# Patient Record
Sex: Male | Born: 1935 | Race: White | Hispanic: No | Marital: Married | Smoking: Former smoker
Health system: Southern US, Community
[De-identification: ages and names within clinical notes are randomized; demographics above are authoritative.]

## PROBLEM LIST (undated history)

## (undated) DIAGNOSIS — E7800 Pure hypercholesterolemia, unspecified: Secondary | ICD-10-CM

## (undated) DIAGNOSIS — C259 Malignant neoplasm of pancreas, unspecified: Secondary | ICD-10-CM

## (undated) DIAGNOSIS — I10 Essential (primary) hypertension: Secondary | ICD-10-CM

## (undated) HISTORY — PX: CARDIAC CATHETERIZATION: SHX172

---

## 2001-02-24 ENCOUNTER — Ambulatory Visit (HOSPITAL_COMMUNITY): Admission: RE | Admit: 2001-02-24 | Discharge: 2001-02-24 | Payer: Self-pay | Admitting: Internal Medicine

## 2002-09-27 ENCOUNTER — Other Ambulatory Visit: Admission: RE | Admit: 2002-09-27 | Discharge: 2002-09-27 | Payer: Self-pay | Admitting: Dermatology

## 2003-10-17 ENCOUNTER — Other Ambulatory Visit: Admission: RE | Admit: 2003-10-17 | Discharge: 2003-10-17 | Payer: Self-pay | Admitting: Dermatology

## 2005-07-12 ENCOUNTER — Other Ambulatory Visit: Admission: RE | Admit: 2005-07-12 | Discharge: 2005-07-12 | Payer: Self-pay | Admitting: Dermatology

## 2006-04-11 ENCOUNTER — Ambulatory Visit (HOSPITAL_COMMUNITY): Admission: RE | Admit: 2006-04-11 | Discharge: 2006-04-11 | Payer: Self-pay | Admitting: Internal Medicine

## 2006-04-11 ENCOUNTER — Ambulatory Visit: Payer: Self-pay | Admitting: Internal Medicine

## 2006-04-11 ENCOUNTER — Encounter (INDEPENDENT_AMBULATORY_CARE_PROVIDER_SITE_OTHER): Payer: Self-pay | Admitting: Specialist

## 2006-10-21 ENCOUNTER — Ambulatory Visit: Payer: Self-pay | Admitting: Internal Medicine

## 2007-01-09 ENCOUNTER — Inpatient Hospital Stay (HOSPITAL_COMMUNITY): Admission: AD | Admit: 2007-01-09 | Discharge: 2007-01-11 | Payer: Self-pay | Admitting: Cardiology

## 2007-01-09 ENCOUNTER — Ambulatory Visit: Payer: Self-pay | Admitting: Internal Medicine

## 2007-01-27 ENCOUNTER — Ambulatory Visit (HOSPITAL_COMMUNITY): Admission: RE | Admit: 2007-01-27 | Discharge: 2007-01-27 | Payer: Self-pay | Admitting: Internal Medicine

## 2007-02-08 ENCOUNTER — Ambulatory Visit: Payer: Self-pay | Admitting: Cardiology

## 2007-04-25 ENCOUNTER — Ambulatory Visit: Payer: Self-pay | Admitting: Cardiology

## 2007-07-24 ENCOUNTER — Ambulatory Visit: Payer: Self-pay

## 2007-07-24 ENCOUNTER — Ambulatory Visit: Payer: Self-pay | Admitting: Cardiology

## 2007-07-28 ENCOUNTER — Ambulatory Visit (HOSPITAL_COMMUNITY): Admission: RE | Admit: 2007-07-28 | Discharge: 2007-07-28 | Payer: Self-pay | Admitting: Internal Medicine

## 2007-11-08 ENCOUNTER — Ambulatory Visit: Payer: Self-pay | Admitting: Cardiology

## 2008-05-22 ENCOUNTER — Ambulatory Visit: Payer: Self-pay | Admitting: Cardiology

## 2009-05-29 DIAGNOSIS — I10 Essential (primary) hypertension: Secondary | ICD-10-CM | POA: Insufficient documentation

## 2009-05-29 DIAGNOSIS — E119 Type 2 diabetes mellitus without complications: Secondary | ICD-10-CM | POA: Insufficient documentation

## 2009-05-29 DIAGNOSIS — E785 Hyperlipidemia, unspecified: Secondary | ICD-10-CM | POA: Insufficient documentation

## 2009-05-29 DIAGNOSIS — I2510 Atherosclerotic heart disease of native coronary artery without angina pectoris: Secondary | ICD-10-CM | POA: Insufficient documentation

## 2009-06-12 ENCOUNTER — Ambulatory Visit: Payer: Self-pay | Admitting: Cardiology

## 2009-07-09 ENCOUNTER — Telehealth (INDEPENDENT_AMBULATORY_CARE_PROVIDER_SITE_OTHER): Payer: Self-pay | Admitting: *Deleted

## 2009-07-09 ENCOUNTER — Encounter (INDEPENDENT_AMBULATORY_CARE_PROVIDER_SITE_OTHER): Payer: Self-pay | Admitting: *Deleted

## 2009-07-25 ENCOUNTER — Encounter: Payer: Self-pay | Admitting: Cardiology

## 2009-09-19 ENCOUNTER — Telehealth (INDEPENDENT_AMBULATORY_CARE_PROVIDER_SITE_OTHER): Payer: Self-pay | Admitting: *Deleted

## 2010-06-18 NOTE — Assessment & Plan Note (Signed)
Summary: f1y  Medications Added METOPROLOL TARTRATE 25 MG TABS (METOPROLOL TARTRATE) Take 1/2  tablet by mouth twice a day      Allergies Added: NKDA  Visit Type:  1 year follow up Primary Provider:  fagan  CC:  Pt. quit taking Plavix and Simvastatin about 1 month ago because side effects - Indigestion.  History of Present Illness: Overall he is doing well.  Because of indigestion he stopped his plavix and simvastatin.  No symptoms except that the indigestion symptoms have markedly improved.  He sees Dr. Ouida Sills regularly.  Current Medications (verified): 1)  Lisinopril 40 Mg Tabs (Lisinopril) .... Take One Tablet By Mouth Daily 2)  Vitamin B-12 100 Mcg Tabs (Cyanocobalamin) .... Take 1 Tablet By Mouth Once A Day 3)  Aspirin 81 Mg Tbec (Aspirin) .... Take One Tablet By Mouth Daily 4)  Metoprolol Tartrate 25 Mg Tabs (Metoprolol Tartrate) .... Take 1/2  Tablet By Mouth Twice A Day  Allergies (verified): No Known Drug Allergies  Past History:  Past Medical History: Last updated: 05/29/2009 Current Problems:  CAD (ICD-414.00) HYPERTENSION, UNSPECIFIED (ICD-401.9) HYPERLIPIDEMIA (ICD-272.4) DM (ICD-250.00) Diet controlled  Past Surgical History: Last updated: 05/29/2009  stent placed in his left anterior descending artery.  Vital Signs:  Patient profile:   77 year old male Height:      66 inches Weight:      174.75 pounds BMI:     28.31 Pulse rate:   82 / minute Pulse rhythm:   regular Resp:     18 per minute BP sitting:   140 / 80  (left arm) Cuff size:   large  Vitals Entered By: Vikki Ports (June 12, 2009 9:23 AM)  Physical Exam  General:  Well developed, well nourished, in no acute distress. Head:  normocephalic and atraumatic Lungs:  Clear bilaterally to auscultation and percussion. Heart:  Non-displaced PMI, chest non-tender; regular rate and rhythm, S1, S2 without murmurs, rubs or gallops. Carotid upstroke normal, no bruit. Normal abdominal aortic size,  no bruits. Femorals normal pulses, no bruits. Pedals normal pulses. No edema, no varicosities. Abdomen:  No masses, splenomegaly, or hepatomegaly.  Has ventral hernia. Pulses:  pulses normal in all 4 extremities Extremities:  No clubbing or cyanosis. Neurologic:  Alert and oriented x 3.   EKG  Procedure date:  06/12/2009  Findings:      NSR.  Nonspecific ST abnormality.    Impression & Recommendations:  Problem # 1:  CAD (ICD-414.00) Patient stopped plavix.   He is now 2.5 years, has everolimus eluting stent, and no symptoms.  Reviewed with him in detail.  Would not restart at this point.  The following medications were removed from the medication list:    Plavix 75 Mg Tabs (Clopidogrel bisulfate) .Marland Kitchen... Take one tablet by mouth daily    Metoprolol Succinate 25 Mg Xr24h-tab (Metoprolol succinate) .Marland Kitchen... Take 1/2 tablet two times a day His updated medication list for this problem includes:    Lisinopril 40 Mg Tabs (Lisinopril) .Marland Kitchen... Take one tablet by mouth daily    Aspirin 81 Mg Tbec (Aspirin) .Marland Kitchen... Take one tablet by mouth daily    Metoprolol Tartrate 25 Mg Tabs (Metoprolol tartrate) .Marland Kitchen... Take 1/2  tablet by mouth twice a day  Orders: EKG w/ Interpretation (93000)  Problem # 2:  HYPERTENSION, UNSPECIFIED (ICD-401.9) Seems controlled.  Follow up with Dr. Orlie Dakin. The following medications were removed from the medication list:    Metoprolol Succinate 25 Mg Xr24h-tab (Metoprolol succinate) .Marland Kitchen... Take 1/2  tablet two times a day His updated medication list for this problem includes:    Lisinopril 40 Mg Tabs (Lisinopril) .Marland Kitchen... Take one tablet by mouth daily    Aspirin 81 Mg Tbec (Aspirin) .Marland Kitchen... Take one tablet by mouth daily    Metoprolol Tartrate 25 Mg Tabs (Metoprolol tartrate) .Marland Kitchen... Take 1/2  tablet by mouth twice a day  Orders: EKG w/ Interpretation (93000)  Problem # 3:  HYPERLIPIDEMIA (ICD-272.4) Encouraged him to begin simvastatin back with food in am.  And have follow up  with Dr. Ouida Sills for numbers.  Pt. agrees with this plan. The following medications were removed from the medication list:    Simvastatin 40 Mg Tabs (Simvastatin) .Marland Kitchen... Take one tablet by mouth daily at bedtime  Orders: EKG w/ Interpretation (93000)  Patient Instructions: 1)  Your physician recommends that you schedule a follow-up appointment as needed. 2)  Your physician recommends that you continue on your current medications as directed. Please refer to the Current Medication list given to you today.

## 2010-06-18 NOTE — Progress Notes (Signed)
Summary: please change Pharm to [facility]  Phone Note Refill Request Call back at Home Phone [phone] Message from:  Patient on [facility]  Refills Requested: Medication #1:  METOPROLOL TARTRATE 25 MG TABS Take 1/2  tablet by mouth twice a day pt needs to switch to [facility] please  Initial call taken by: Omer Jack,  Sep 19, 2009 9:04 AM  Follow-up for Phone Call        Rx faxed to pharmacy Follow-up by: Vikki Ports,  Sep 19, 2009 11:43 AM    Prescriptions: METOPROLOL TARTRATE 25 MG TABS (METOPROLOL TARTRATE) Take 1/2  tablet by mouth twice a day  #90 x 3   Entered by:   Vikki Ports   Authorized by:   Ronaldo Miyamoto, MD, [facility]   Signed by:   Vikki Ports on 09/19/2009   Method used:   Faxed to ...       [facility] (mail-order)             , [place]         Ph:        Fax: [phone]   RxID:   [id number]

## 2010-06-18 NOTE — Miscellaneous (Signed)
Summary: update med  Clinical Lists Changes  Medications: Added new medication of SIMVASTATIN 40 MG TABS (SIMVASTATIN) Take one tablet by mouth daily at bedtime

## 2010-06-18 NOTE — Progress Notes (Signed)
Summary: refill  Phone Note Refill Request Message from:  Patient on July 09, 2009 9:49 AM  Simvastatin 40mg  send [facility] [phone] 45month supply  Initial call taken by: Judie Grieve,  July 09, 2009 9:51 AM  Follow-up for Phone Call        Rx faxed to pharmacy Follow-up by: Vikki Ports,  July 09, 2009 11:45 AM

## 2010-07-13 ENCOUNTER — Telehealth (INDEPENDENT_AMBULATORY_CARE_PROVIDER_SITE_OTHER): Payer: Self-pay | Admitting: *Deleted

## 2010-07-23 NOTE — Progress Notes (Signed)
Summary: refill med  Phone Note Refill Request Call back at Home Phone [phone] Message from:  Patient on July 13, 2010 8:58 AM  Refills Requested: Medication #1:  SIMVASTATIN 40 MG TABS Take one tablet by mouth daily at bedtime.  Medication #2:  METOPROLOL TARTRATE 25 MG TABS Take 1/2  tablet by mouth twice a day [facility] .    Method Requested: Fax to Mail Away Pharmacy Initial call taken by: Lorne Skeens,  July 13, 2010 8:58 AM  Follow-up for Phone Call        Rx faxed to pharmacy. Vikki Ports  July 13, 2010 10:25 AM     Prescriptions: SIMVASTATIN 40 MG TABS (SIMVASTATIN) Take one tablet by mouth daily at bedtime  #90 x 3   Entered by:   Vikki Ports   Authorized by:   Ronaldo Miyamoto, MD, [facility]   Signed by:   Vikki Ports on 07/13/2010   Method used:   Faxed to ...       [facility] (mail-order)             , [place]         Ph:        Fax: [phone]   RxID:   [id number] METOPROLOL TARTRATE 25 MG TABS (METOPROLOL TARTRATE) Take 1/2  tablet by mouth twice a day  #90 x 3   Entered by:   Vikki Ports   Authorized by:   Ronaldo Miyamoto, MD, [facility]   Signed by:   Vikki Ports on 07/13/2010   Method used:   Faxed to ...       [facility] (mail-order)             , [place]         Ph:        Fax: [phone]   RxID:   [id number]

## 2010-09-29 NOTE — Discharge Summary (Signed)
Wyatt [name], CARREIRA NO.:  [id number]   MEDICAL RECORD NO.:  [id number]          PATIENT TYPE:  INP   LOCATION:  6524                         FACILITY:  [facility]   PHYSICIAN:  Arturo Morton. Riley Kill, MD, FACCDATE OF BIRTH:  [birth year]   DATE OF ADMISSION:  01/09/2007  DATE OF DISCHARGE:                         DISCHARGE SUMMARY - REFERRING   HISTORY OF PRESENT ILLNESS:  Mr. Valone is a 77 year old white male who  was referred from his primary care physician's office secondary to  exertional chest discomfort.  Mr. Samuella Cota describes a one week history of  anterior chest tightness/funny feeling.  It always is associated with  walking and relieved with an 2 minutes of rest.  He has not had any rest  or nocturnal episodes.  He has noted some slight increased shortness of  breath associated with this.  He denies any radiation, nausea, vomiting  or diaphoresis.  While keeping over the weekend, he had 3-4 episodes  after walking 75 yards, promptly relieved with rest.  His wife commented  that she thought he looked pale on occasion.  He called his primary care  physician and was transferred here for further evaluation.   ALLERGIES:  No known drug allergies.   MEDICATIONS PRIOR TO ADMISSION:  1. Lisinopril 40 mg daily.  2. Baby aspirin 2 tablets daily.   PAST MEDICAL HISTORY:  1. Hypertension.  He checks his at home and it arranges between 120s      and 130s over 60s to 80s.  2. Hyperlipidemia in October 2007.  Total cholesterol was 139,      triglycerides 118, HDL 31, LDL 84.  3. Diabetes, diet controlled.  He does not check his sugars often.  In      October 2007, his hemoglobin A1c was 6.6.  He does recall stress      test 25 years ago.  He denies any surgeries.   SOCIAL HISTORY:  He resides in [place] with his wife.  He has two  daughters, one son, alive and well, two grandchildren.  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  He has not smoked tobacco  in  over 40 years.  Denies any alcohol, drugs, herbal medications, specific  diet.  He states that he walks occasionally, and he participates in yard  work, but is not on a regular exercise program.   FAMILY HISTORY:  His mother is deceased at the age of 48 with a CVA  postoperatively, history of anemia.  His father died at the age of 25  with possible myocardial infarction.  Two brothers are deceased, one  that was electrocuted, the other with cancer.  Three sisters are  deceased, all with cancer.  One sister is living with a ovarian cancer  and leukemia.   REVIEW OF SYSTEMS:  In addition to the above, is notable for reading  glasses, hearing loss, poor dictation, multiple skin cancers removed,  problem with snoring (has not been evaluated for sleep apnea), nocturia,  arthralgias in his neck and back and GERD symptoms.   LABORATORY:  Chest  X-Ray does not show any acute findings.  Admission  H&H of 16.0 and 46.4, normal indices, platelets 166, WBCs 6.7.  Prior to  discharge on 27th, H&H was 14.6 and 42.3, platelets 157, WBCs 7.1.  Admission PTT 26, PT 13.0.  Sodium 138, potassium 4.0, BUN 9, creatinine  0.88, normal LFTs .  Prior to discharge, sodium was 139, potassium 4.0,  BUN 12, creatinine 0.95, glucose 125, hemoglobin A1c was elevated at  6.6.  CK MBs relative indexes were within normal limits.  Troponins were  slightly elevated to 0.2, 0.2 and 0.17.  Fasting lipids showed total  cholesterol 135, triglycerides 126, HDL 29, LDL of 81.  EKG showed  normal sinus rhythm, normal axis, early R-wave, nonspecific T-wave  changes.   HOSPITAL COURSE:  Mr. Allsbrook was admitted to [facility] and  placed on IV heparin.  On August 27, he underwent cardiac  catheterization by Dr. Riley Kill.  This showed a proximal 20-30%, RCA 30%  mid circ, 40% proximal diagonal 2, 50% mid LAD 40% mid/distal LAD.  He  also had a proximal 95% LAD lesion with clot and dissection.  Using a  drug-eluting  stent, Dr. Riley Kill reduced this to 0%.  Restoring TIMI III  flow.  It was performed without complications.  Research entered the  patient to be DEF trial.  August 27, the patient was ambulating the  halls without difficulty.  Catheterization site was intact and Dr.  Riley Kill felt that the patient could be discharged home.   DISCHARGE DIAGNOSIS:  1. Acute coronary syndrome.  2. Coronary artery disease.  3. Status post drug-eluting stent to proximal LAD.  4. Hyperlipidemia.  5. Hyperglycemia with a history of diet-controlled diabetes and a      hemoglobin A1c that was elevated at 6.6.  History as previously.   PROCEDURES PERFORMED:  Cardiac catheterization with drug-eluting stent  on January 11, 2007 performed by Dr. Riley Kill.   DISPOSITION:  The patient is discharged home.   DISCHARGE INSTRUCTIONS:  1. He is asked to maintain low salt, fat, cholesterol, ADA diet.  2. Wound care and activities are per supplemental sheet.  3. He will have blood work drawn in our office Friday in regards to      the DEF trial.  4. Prior to discharge, cardiac rehab assisted with ambulation and      education.   DISCHARGE MEDICATIONS:  1. He received new prescriptions for:      a.     Plavix 75 mg daily.      b.     Nitroglycerin 0.4 p.r.n.  C . Protonix 40 mg daily.  D.  Lopressor 25 mg b.i.d.  E.  Lipitor 20 mg q.h.s.  1. He was asked to continue his lisinopril 40 mg daily.  2. His aspirin was increased to 325 mg daily.  3. He will need blood work in 6-8 weeks in regards to FLP and LFTs      since Lipitor was initiated.   He was asked to bring all medications to all follow-up appointments.   FOLLOWUP:  He will see Dr. Riley Kill on February 08, 2007, at 4:00 p.m.,  the first available appointment.  He will follow up with Dr. Ouida Sills as  needed.  Consideration should be given to an oral hypoglycemic.   DISCHARGE TIME:  Forty five minutes.      Joellyn Rued, PA-C      Arturo Morton. Riley Kill, MD,  [facility]  Electronically Signed  EW/MEDQ  D:  01/11/2007  T:  01/11/2007  Job:  [id number]   cc:   [name], MD  Arturo Morton Riley Kill, MD, [facility]

## 2010-09-29 NOTE — Letter (Signed)
April 25, 2007    [name], MD  [address]  [place], [place] [address]   RE:  [name], [name]  MRN:  [id number]  /  DOB:  Jan 24, 1934   Dear [name]:   It was a pleasure seeing Wyatt [name] in the office today in a followup  visit.  Clinically, he seems to be getting along extremely well.  He  says he had lipid checked about 3 months ago and about a month ago in  your office was told that things were in good shape.  He has not had  chest pain or shortness of breath.   Today on examination, the blood pressure is 120/70, pulse 56.  The lung fields are clear.  The cardiac rhythm was regular.   Overall, he continues to get along extremely well.  I recommended he  stay on aspirin and Plavix indefinitely for the present time.  He can go  down to 81 mg after 6 months.  We will do a stress test in approximately  3 months.  With regard to his other evaluations, I noted in the chart  abdominal ultrasound suggesting a followup examination of the right  kidney in 3 to 6 months, and I have informed him of this.  I will  certainly leave this to your discretion as you ordered the initial  study.  Thanks for allowing me to share in his care.    Sincerely,      Arturo Morton. Riley Kill, MD, [facility]  Electronically Signed   TDS/MedQ  DD: 04/25/2007  DT: 04/25/2007  Job #: [id number]

## 2010-09-29 NOTE — H&P (Signed)
Wyatt [name], BARNETTE NO.:  [id number]   MEDICAL RECORD NO.:  [id number]          PATIENT TYPE:  INP   LOCATION:  2019                         FACILITY:  [facility]   PHYSICIAN:  Bevelyn Buckles. Bensimhon, MDDATE OF BIRTH:  27-Jul-1933   DATE OF ADMISSION:  01/09/2007  DATE OF DISCHARGE:                              HISTORY & PHYSICAL   HISTORY:  Wyatt [name] is a 77 year old white male who is referred from  his primary care physician's office for a direct admission secondary to  exertional chest discomfort.   [name] describes a one-week history of anterior chest funny  feelings/tightness associated with slight shortness of breath, always  associated with walking, and relieved within two minutes of rest.  The  discomfort does not radiate, nor has been it associated with nausea,  vomiting or diaphoresis.  He has not had any rest or nocturnal episodes.  While camping over the weekend, he states that he had 3 or 4 episodes  after walking 75 yards; however, they were, again, relieved with rest.  His wife comments that she thought he was pale a couple of occasions.  He saw his primary care physician today, and was referred here for  further evaluation.   ALLERGIES:  No known drug allergies.   MEDICATIONS:  Prior to admission include:  1. Lisinopril 40 mg daily.  2. Baby aspirin 2 daily.   PAST MEDICAL HISTORY:  Notable for hypertension, which ranges between  the 120s and 130s systolically, and 60s to 80s diastolically.  He has a  history of untreated hyperlipidemia.  On October 2007, total cholesterol  was 139, triglycerides 118, HDL 31, LDL 84.  He had diet-controlled type  2 diabetes.  He does not check his blood sugars often.  He has never had  any surgeries.  He states that he did have a stress test approximately  25 years ago.  He specifically denies any history of myocardial  infarctions, CVAs, COPD, bleeding dyscrasias or thyroid disfunction.   SOCIAL HISTORY:  He  resides in [place] with his wife.  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  He has two  daughters, one son and two grandchildren are alive and well.  He has not  smoked in over 40 years.  He denies any alcohol, drugs, herbal  medication.  He does not follow a specific diet.  He does not have a  regular exercise program per se; however, he walks frequently and does  yard work.   FAMILY HISTORY:  His mother is deceased at the age of 22 with a CVA  postoperatively and a history of anemia.  His father died at 60 with a  probable myocardial infarction.  He has 2 brothers deceased.  One was  electrocuted, the other died of cancer.  He has one sister who is  living, being treated for ovarian cancer and leukemia.  Three sisters  are deceased all with cancer.   REVIEW OF SYSTEMS:  In addition to above is notable for reading glasses,  hearing loss, fair dentition, several  skin cancer removed from the face,  snoring, rare nocturia, arthralgias in his neck and back, and  postprandial GERD.  All other systems are unremarkable.   PHYSICAL EXAMINATION:  GENERAL:  Well-developed, well-developed,  pleasant, white male in no apparent distress.  Accompanied by his wife  and daughter.  VITAL SIGNS:  Temperature is 98.5, blood pressure 128/82, pulse 66 and  regular, respirations 18 and regular, 94% sat on room air, weight is  79.4 kilograms.  HEENT:  Unremarkable.  NECK:  Supple without thyromegaly, adenopathy, JVD or carotid bruits.  CHEST:  Symmetrical excursion.  LUNGS:  Clear to auscultation.  HEART:  PMI is not displaced.  Regular rate and rhythm.  I do not  appreciate any murmurs, rubs, clicks or gallops.  All pulses are  symmetrical and intact.  I did not appreciate any abdominal or femoral  bruits.  SKIN INTEGUMENT:  Intact.  ABDOMEN:  Slightly round.  Bowel sound present without organomegaly,  masses or tenderness.  EXTREMITIES:  Negative cyanosis, clubbing  or edema.  MUSCULOSKELETAL:  Unremarkable.  NEURO:  Unremarkable.   EKG from Dr. Alonza Smoker office showed sinus bradycardia and normal axis,  early or nonspecific ST-T wave changes.  Old EKGs are not available for  comparison.   IMPRESSION:  1. Acute coronary syndrome.  2. History of hypertension, diabetes and hyperlipidemia.   DISPOSITION:  Dr. Gala Romney has reviewed the patient's history, spoke  with and examined the patient, and agrees with the above.  We will admit  him to rule out myocardial infarction.  We will check our usual labs,  and arrange for cardiac catheterization in the morning, of which the  procedure, risks and benefits have been explained to the patient.  In  regard to his medicine, we will hold his lisinopril.  Begin aspirin,  Lopressor, Protonix and Lipitor.  Post-catheterization, lisinopril may  be added back depending on findings.      Joellyn Rued, PA-C      Bevelyn Buckles. Bensimhon, MD  Electronically Signed    EW/MEDQ  D:  01/09/2007  T:  01/09/2007  Job:  [id number]   cc:   [name], MD  Bevelyn Buckles. Bensimhon, MD

## 2010-09-29 NOTE — Letter (Signed)
November 08, 2007    [name], MD  [address]  [place], [place] [address]   RE:  [name], [name]  MRN:  [id number]  /  DOB:  Jun 13, 1933   Dear [name],   I had the pleasure of seeing Wyatt [name] in the office today in a  followup visit.  He is doing well.  He has no major limitations.  He has  not had recurrent chest pain.  He tells me that he saw you recently.   CURRENT MEDICATIONS:  Plavix 75 mg daily, lisinopril 40 mg daily,  simvastatin 40 mg nightly, vitamin B12 daily, metoprolol 25 mg b.i.d.  and aspirin 81 mg daily.   On physical, he is alert and oriented, in no distress.  The weight was  178 pounds, the blood pressure 132/80, and the pulse was 47.  The lung  fields were clear to auscultation and percussion.  The PMI was  nondisplaced and there was no significant murmur noted.  The abdomen was  soft and there was no obvious hepatosplenomegaly.   The electrocardiogram demonstrates sinus bradycardia, otherwise  unremarkable.   To summarize, Wyatt [name] appears to be doing quite well.  He is  scheduled to see you back in followup in about 3 months.  I suggested  that he might consider cutting his metoprolol to 12.5 mg p.o. b.i.d.  about a week before he sees you back.  He has no other way to get his  blood pressure checked at the current time.  He is moderately  bradycardic, but this is totally asymptomatic.  With this, I thought  that would be a reasonable approach.  At the present time, I  would continue him on dual antiplatelet therapy, and we will see him  back in followup in 6 months.  I know you are following his lipids.   Thanks so much for allowing me to share in his care.    Sincerely,      Arturo Morton. Riley Kill, MD, [facility]  Electronically Signed    TDS/MedQ  DD: 11/08/2007  DT: 11/09/2007  Job #: [id number]

## 2010-09-29 NOTE — Cardiovascular Report (Signed)
Wyatt [name], Wyatt [name] NO.:  [id number]   MEDICAL RECORD NO.:  [id number]          PATIENT TYPE:  INP   LOCATION:  2807                         FACILITY:  [facility]   PHYSICIAN:  Arturo Morton. Riley Kill, MD, FACCDATE OF BIRTH:  11-18-1933   DATE OF PROCEDURE:  01/10/2007  DATE OF DISCHARGE:                            CARDIAC CATHETERIZATION   INDICATIONS:  Mr. Schwegler is delightful 77 year old gentleman who  presents with exertional angina.  This is Class III.  It is a fairly  typical pattern.  He was seen by Dr. Ouida Sills and referred to [facility] for further evaluation.  Risks, benefits, and alternatives were  discussed with the patient in detail.  He consented to proceed.   PROCEDURE:  1. Left heart catheterization  2. Selective coronary arteriography.  3. Selective left ventriculography.  4. Percutaneous transluminal cardiac angioplasty and stenting of the      left anterior descending artery with intravascular ultrasound      guidance.   DESCRIPTION OF PROCEDURE:  The patient was brought to the  catheterization laboratory, prepped and draped in the usual fashion.  Through an anterior puncture, the right femoral artery was easily  entered, and a 5-French sheath was placed.  Views of left and right  coronary arteries were obtained in multiple angiographic projections.  Central aortic and left ventricular pressures measured with a pigtail.  Ventriculography was then performed in the RAO projection.  Following  this, we discussed the case with the patient and subsequently his  family.  He had a critical stenosis in the proximal left anterior  descending artery. Percutaneous coronary intervention was recommended.  Risks were discussed with the patient and then subsequently with his  family.  The patient had already received heparin, and was given heparin  at 50 units/kg.  Eptifibatide was administered according to protocol.  ACTs were checked and found to be  appropriate for percutaneous  intervention.  The patient had previously received chewable aspirin.  The patient also received 600 mg of oral clopidogrel.   Following this, a JL-4 guiding catheter was utilized but did not fit  well, and so we exchanged for a JL-3.5 guide. A Prowater wire was taken  down the vessel, and predilatation done with a 2.25 mm balloon.  Following this, intravascular ultrasound was done, and the main reason  was because of extensive calcification to make sure that rotational  atherectomy would not be required as pre-PCI strategy. In addition,  vessel sizing and length of stent estimation were also utilized by the  intravascular ultrasound.  Following this, we then placed a 3.5 x 15  PROMUS drug-eluting stent.  This was deployed at approximately 11  atmospheres, largely because of tapering in the vessel slightly distally  to about 3.3, 3.4 artery.  Following this, a 3.75 Quantum Maverick was  taken into the vessel, and post dilatations done aggressively  throughout.  Intravascular ultrasound was then repeated.  There was  excellent stent deployment throughout except for the proximal 0.4-0.6  mm.  We then went in with a 4.0 Quantum Maverick balloon, and post  dilatations  were done in the proximal portion of the stent.  A repeat  intravascular ultrasound demonstrated some continued lack of complete  stent at the apposition of the proximal 0.3 mm.  With this, we went back  in with a Quantum Maverick 4-0 balloon, and then did a 13-atmosphere  inflation proximally, telescoping just outside the stent proximally.  There is a small cap area to which the stent was placed and represented  only a tiny area, and, therefore, it was felt that further dilatation  would be not only difficult but potentially dangerous. As a result, the  4.0 balloon was then subsequently removed.  No final ultrasound was  obtained at this point.  All catheters were subsequently removed, and  there  was an excellent angiographic appearance.  There were no  complications.   HEMODYNAMIC DATA:  1. Central aortic pressure was 107/67 with a mean of 84  2. Left ventricular pressure was 117/12.  There was no gradient on      pullback across aortic valve.   ANGIOGRAPHIC DATA:  1. On plain fluoroscopy, there is extensive calcification of      coronaries, particularly in the proximal left anterior descending      artery.  2. There is a 95% proximal left anterior descending stenosis with      evidence of visible thrombus just distal to the lesion.  This is      after a small tiny ramus intermedius vessel.  There is some      segmental plaquing going into the mid distal vessel, and there is a      50% area of stenosis and then a 40% lesion more distally.  The      distal vessel wraps the apical tip.  3. The circumflex is a large-caliber vessel providing a large marginal      branch with about 30% eccentric plaquing in its mid portion.  4. The right coronary artery is a large-caliber vessel with about 20-      30% proximal narrowing.  Distally, the vessel is without      significant focal narrowing.  5. Ventriculography in the RAO projection reveals preserved global      systolic function.  6. Following percutaneous stenting, the 95% stenosis with clot was      reduced to 0% with no residual stenosis.  There was no evidence of      edge tear.  There was an excellent angiographic appearance.   INTRAVASCULAR ULTRASOUND:  Intravascular ultrasound demonstrated a  reference vessel diameter distally of about 3.3-3.4.  This was just  after the diagonal branch.  In addition, the lesion itself was  approximately 3.5-3.7 telescoping proximally.  Following stent  deployment, the stent was fully deployed except a proximal 0.3 mm, and  this was postdilated aggressively with a 4 mm balloon outside the stent  at the completion of the procedure.  There were no complications.   CONCLUSION:  1.  Well-preserved left ventricular function.  2. High-grade proximal left anterior descending stenosis with      successful percutaneous intervention using a PROMUS drug-eluting      stent platform.   DISPOSITION:  The patient be treated medically. Aspirin and Plavix will  be recommended.  Aggressive lipid reduction will also be recommended.  Close follow up with Dr. Carylon Perches will be recommended as well.      Arturo Morton. Riley Kill, MD, [facility]  Electronically Signed     TDS/MEDQ  D:  01/10/2007  T:  01/10/2007  Job:  [id number]   cc:   [name], MD  [facility]

## 2010-09-29 NOTE — Procedures (Signed)
[facility]                              EXERCISE TREADMILL   NAME:Wyatt [name], Wyatt [name]                      MRN:          [id number]  DATE:07/24/2007                            DOB:          [birth year]    DURATION OF EXERCISE:  5 minute and 14 seconds.   MAXIMUM HEART RATE:  109   PERCENT OF PMHR:  PMH are 73%.   COMMENT:  [name] exercised today on the Bruce protocol.  He  tolerated exercise reasonably well.  He experienced no chest pain. Test  was terminated due to fatigue.  At maximum stress, there was no  significant ST depression noted.  There was one ventricular couplet, but  otherwise no significant ectopy.   RISK SUMMATION:  The patient has previously undergone stenting of the  left anterior descending artery.  He has done well.  The current study  does not demonstrate evidence of exercise-induced chest pain or  significant ST depression.  He is on beta blockade, his blood pressure  is borderline controlled.  He is followed by Dr. Ouida Sills. He is also on  lipid lowering therapy.  We have recommended that he stop his Protonix  which is currently 40 mg daily. He will remain on Plavix.  In 1 week  I  have told him he can reduce his aspirin to 81 mg daily and remain on  that indefinitely.  We will see him back in cardiac followup in 3  months.  Should he have any problems in the interim he is to let us  know.     Arturo Morton. Riley Kill, MD, [facility]  Electronically Signed    TDS/MedQ  DD: 07/24/2007  DT: 07/24/2007  Job #: [id number]   cc:   [name], MD

## 2010-09-29 NOTE — Letter (Signed)
May 22, 2008    [name], MD  [address]  [place], [place] [address]   RE:  Wyatt [name]  MRN:  [id number]  /  DOB:  01/26/1934   Dear Wyatt [name]:   I had the pleasure of seeing Wyatt [name] in the office today in  followup.  Since I last saw him, he is getting along extremely well.  He  denies any chest pain or progressive shortness of breath.  He tells me  that you have been following his lipids.   MEDICATIONS:  1. Plavix 75 mg daily.  2. Lisinopril 40 mg daily.  3. Simvastatin 40 mg nightly.  4. Vitamin B12 one daily.  5. Aspirin 81 mg daily.  6. Metoprolol 12.5 mg p.o. b.i.d.   PHYSICAL EXAMINATION:  GENERAL:  He is alert and oriented in no  distress.  VITAL SIGNS:  The blood pressure is 132/70 and the pulse is 52.  LUNGS:  Fields are clear.  CARDIAC:  Rhythm is regular.  There is no S4 gallop.  EXTREMITIES:  No edema.   The electrocardiogram demonstrates sinus bradycardia, otherwise within  normal limits.   To summarize, this gentleman is doing extremely well; as you know, we  had stent placed in his left anterior descending artery.  He has  remained on dual antiplatelet therapy.  He and I had a lengthy  discussion today about the risks and benefits of dual antiplatelet  therapy, which are continuing, this currently is an area of intense  investigation within the [place] as to  lengthen duration of therapy.  At the present time, I have suggested  that he will remain on Plavix.  We will plan to see him back in follow  up in 1 year to review these findings again as more research information  becomes available.  I appreciate the opportunity of sharing this nice  gentleman's care.    Sincerely,      Arturo Morton. Riley Kill, MD, [facility]  Electronically Signed    TDS/MedQ  DD: 05/22/2008  DT: 05/22/2008  Job #: [id number]

## 2010-09-29 NOTE — Letter (Signed)
February 08, 2007    [name], MD  [address]  [place], [place] [address]   RE:  [name], [name]  MRN:  [id number]  /  DOB:  02/05/1934   Dear [name]:   I had the pleasure of seeing Wyatt [name] in the office today in a  followup visit.  Cardiac-wise, he appears to be stable.  He has not been  having any ongoing chest pain or shortness of breath.  He feels overall  pretty well.  As you know, he underwent stenting of a critical left  anterior descending stenosis with a drug-eluting stent guided by  intravascular ultrasound.   CURRENT MEDICATIONS:  1. Aspirin 325 mg daily.  2. Plavix 75 mg daily.  3. Protonix 40 mg daily.  4. Lopressor 25 mg p.o. b.i.d.  5. Lipitor 20 mg nightly.  6. Lisinopril 40 mg daily.   On physical examination, he is alert and oriented, in no acute distress.  The blood pressure is 156/82, the pulse is 52.  The lung fields are  clear and the cardiac rhythm is regular.  Groin is well-healed.   The electrocardiogram demonstrated sinus bradycardia, otherwise  unremarkable.   To summarize, this gentleman is doing well from a clinical standpoint.  He is concerned about the cost of his medicines, and I have taken the  liberty of replacing his Lipitor with Simvastatin 40 mg.  He will need  to have a followup lipid and liver profile.  He also will require Plavix  for a minimum of 1 year, and possibly longer if he tolerates this.  We  plan to see him back in  Cardiology Clinic in about 3 months.  Should he have any problems in the  interim, please do not hesitate to give [place] a call.  I appreciate the  opportunity of sharing in this nice gentleman's care.   Best regards.    Sincerely,      Arturo Morton. Riley Kill, MD, [facility]  Electronically Signed    TDS/MedQ  DD: 02/08/2007  DT: 02/09/2007  Job #: [id number]

## 2010-10-02 NOTE — Op Note (Signed)
NAME:  COADY, TRAIN               ACCOUNT NO.:  [id number]   MEDICAL RECORD NO.:  [id number]          PATIENT TYPE:  AMB   LOCATION:  DAY                           FACILITY:  [facility]   PHYSICIAN:  Lionel December, M.D.    DATE OF BIRTH:  Jul 08, 1933   DATE OF PROCEDURE:  DATE OF DISCHARGE:                               OPERATIVE REPORT   PROCEDURE:  Colonoscopy.   INDICATION:  Mr. Aleshire is a 77 year old, Caucasian male who had large  adenoma with high-grade dysplasia removed in September 1997.  He had his  last exam in October 2002 which was normal.  He is now returning for a  surveillance colonoscopy.  Procedure and risks were reviewed with the  patient and informed consent was obtained.   MEDICATIONS:  Conscious sedation, Demerol 25 mg IV, Versed 3 mg IV.   FINDINGS:  Procedure performed in endoscopy suite.  The patient,s vitals  signs and O2 were monitored during the procedure and remained stable.  Patient was placed in left lateral position and rectal examination  performed.  No abnormality noted on external or digital exam.  Olympus  radioscope was placed through the rectum and advanced to sigmoid colon  beyond.  Preparation was satisfactory.  There were a few, very small  diverticula at sigmoid colon. The scope was passed into the cecum which  was identified by the appendiceal orifice and ileocecal valve.  Pictures  taken for the record.  As the scope was withdrawn, colonic mucosa was  carefully examined.  There was a single, small polyp in the transverse  colon which was ablated by a cold biopsy.  Mucosa and rest of the colon  was normal.  Rectal mucosa similarly was normal.  Scope was retroflexed  to examine anorectal tension.  Small hemorrhoids were noted below the  dentate line along with anal papilla which was left alone.  The scope  was withdrawn.  Patient tolerated the procedure well.   FINAL DIAGNOSES:  1. Small polyp ablated by cold biopsy from transverse colon.  2.  Few small diverticula in the sigmoid colon.  3. External hemorrhoids in anal papilla.   RECOMMENDATIONS:  Standard instructions given.  I will be contacting the  patient with biopsy results and further recommendations.      Lionel December, M.D.  Electronically Signed     NR/MEDQ  D:  04/11/2006  T:  04/11/2006  Job:  [id number]   cc:   [name], MD  Fax: [phone]

## 2010-12-16 ENCOUNTER — Ambulatory Visit (HOSPITAL_BASED_OUTPATIENT_CLINIC_OR_DEPARTMENT_OTHER)
Admission: RE | Admit: 2010-12-16 | Discharge: 2010-12-16 | Disposition: A | Discharge status: Home / Self Care | Payer: Medicare Other | Source: Ambulatory Visit | Attending: Otolaryngology | Admitting: Otolaryngology

## 2010-12-16 ENCOUNTER — Other Ambulatory Visit: Payer: Self-pay | Admitting: Otolaryngology

## 2010-12-16 DIAGNOSIS — L723 Sebaceous cyst: Secondary | ICD-10-CM | POA: Insufficient documentation

## 2011-01-01 NOTE — Op Note (Signed)
  NAMESKY, BORBOA               ACCOUNT NO.:  [id number]  MEDICAL RECORD NO.:  [id number]  LOCATION:                                 FACILITY:  PHYSICIAN:  Kristine Garbe. Ezzard Standing, M.D. DATE OF BIRTH:  DATE OF PROCEDURE:  12/16/2010 DATE OF DISCHARGE:                              OPERATIVE REPORT   PREOPERATIVE DIAGNOSIS:  Chronic infected cyst, right ear canal.  POSTOPERATIVE DIAGNOSIS:  Chronic infected cyst, right ear canal.  OPERATION:  Excision of right external ear canal cyst anteriorly.  SURGEON:  Kristine Garbe. Ezzard Standing, MD  ANESTHESIA:  Local, 1% Xylocaine with epinephrine.  COMPLICATIONS:  None.  BRIEF CLINICAL NOTE:  Wyatt [name] is a 77 year old gentleman who has had a chronic right ear infection for over a month now.  He had been treated with ear drops as well as antibiotic and ear wick and has a persistent infected cyst that intermittently drains in the anterior portion of the right ear.  It obstructs visualization of the TM. Because this has been persistent, he is taken to the operating room at this time for excision of right external ear canal cyst.  This was located at the distal end of the cartilaginous external ear canal anteriorly.  DESCRIPTION OF PROCEDURE:  Ear canal was prepped with Betadine solution and then injected with 2-3 mL of Xylocaine with epinephrine for local anesthetic.  Using a beaver blade, the skin was cut just anterior to the cyst and then dissection was carried out deep in the cyst to the ear canal down to the cartilage and to the beginning of the bony ear canal. The cyst was removed.  There is some granulation tissue deep within the cavity which was removed with cup forceps.  Specimen was sent to pathology.  After removing the cyst and granulation tissue, the wound was irrigated with hydrogen peroxide.  There was minimal bleeding.  A Merocel sponge was placed in the ear canal and hydrated with Ciprodex ear drops.  Wyatt [name] tolerated  this well.  He is discharged home later this morning and will follow up in my office in 2 days for recheck and have the Merocel pack removed.  DISPOSITION:  Wyatt [name] is discharged home [name] Keflex 500 mg b.i.d. along with Ciprodex ear drops 4 drops b.i.d., Tylenol and Vicodin p.r.n. pain. We will have him follow up in my office in 2 days for recheck and have the ear wick removed.          ______________________________ Kristine Garbe Ezzard Standing, M.D.     CEN/MEDQ  D:  12/16/2010  T:  12/16/2010  Job:  [id number]  Electronically Signed by Dillard Cannon M.D. [name] 01/01/2011 09:29:55 AM

## 2011-02-26 LAB — COMPREHENSIVE METABOLIC PANEL
ALT: 26
Albumin: 3.9
Alkaline Phosphatase: 62
Chloride: 100
Glucose, Bld: 92
Potassium: 4
Sodium: 138
Total Bilirubin: 1
Total Protein: 7.1

## 2011-02-26 LAB — CARDIAC PANEL(CRET KIN+CKTOT+MB+TROPI)
CK, MB: 3.1
CK, MB: 3.8
CK, MB: 5.1 — ABNORMAL HIGH
Relative Index: INVALID
Total CK: 48
Total CK: 65
Total CK: 76
Troponin I: 0.2 — ABNORMAL HIGH

## 2011-02-26 LAB — CBC
HCT: 42.3
HCT: 44.6
HCT: 46.4
Hemoglobin: 14.6
Hemoglobin: 15.1
Hemoglobin: 15.4
Hemoglobin: 16
MCV: 87
Platelets: 166
RBC: 5.01
RBC: 5.13
RDW: 13.1
RDW: 13.4
RDW: 13.4
WBC: 6.7
WBC: 6.8
WBC: 8

## 2011-02-26 LAB — BASIC METABOLIC PANEL
Chloride: 105
GFR calc non Af Amer: >60
Glucose, Bld: 125 — ABNORMAL HIGH
Potassium: 4
Sodium: 139

## 2011-02-26 LAB — APTT: aPTT: 26

## 2011-02-26 LAB — LIPID PANEL: Cholesterol: 135

## 2011-02-26 LAB — HEPARIN LEVEL (UNFRACTIONATED): Heparin Unfractionated: 0.62

## 2011-04-21 ENCOUNTER — Encounter (INDEPENDENT_AMBULATORY_CARE_PROVIDER_SITE_OTHER): Payer: Self-pay | Admitting: *Deleted

## 2011-04-28 ENCOUNTER — Encounter (INDEPENDENT_AMBULATORY_CARE_PROVIDER_SITE_OTHER): Payer: Self-pay | Admitting: *Deleted

## 2011-04-28 ENCOUNTER — Other Ambulatory Visit (INDEPENDENT_AMBULATORY_CARE_PROVIDER_SITE_OTHER): Payer: Self-pay | Admitting: *Deleted

## 2011-04-28 ENCOUNTER — Telehealth (INDEPENDENT_AMBULATORY_CARE_PROVIDER_SITE_OTHER): Payer: Self-pay | Admitting: *Deleted

## 2011-04-28 DIAGNOSIS — Z800 Family history of malignant neoplasm of digestive organs: Secondary | ICD-10-CM

## 2011-04-28 DIAGNOSIS — Z860100 Personal history of colon polyps, unspecified: Secondary | ICD-10-CM

## 2011-04-28 MED ORDER — PEG-KCL-NACL-NASULF-NA ASC-C 100 G PO SOLR: 1.0000 | Freq: Once | ORAL | Status: DC

## 2011-04-28 NOTE — Telephone Encounter (Signed)
Patient needs movi prep 

## 2011-05-25 ENCOUNTER — Telehealth (INDEPENDENT_AMBULATORY_CARE_PROVIDER_SITE_OTHER): Payer: Self-pay | Admitting: *Deleted

## 2011-05-25 NOTE — Telephone Encounter (Signed)
Requesting MD:  Ouida Sills   PCP:    Name: Wyatt [name]  DOB: 02/04/1934   Procedure: TCS  Reason/Indication:  Surveillance, hx polyps, + FH CRC  Has patient had this procedure before?  yes  If so, when, by whom and where?  03/2006  Is there a family history of colon cancer?  yes  Who?  What age when diagnosed?  mother  Is patient diabetic?   borderline      Does patient have prosthetic heart valve?  no  Do you have a pacemaker?  no  Has patient had joint replacement within last 12 months?  no  Is patient on Coumadin, Plavix and/or Aspirin? yes  Medications: ASA 81 mg daily, Lisinopril 40 mg daily, Simvastatin 20 mg daily, Metoprolol 5 mg 1/2 tab bid, Metformin 500 mg bid  Allergies: NKDA  Pharmacy: [facility]  Medication Adjustment: ASA 2 days before, 1/2 normal dose of Metformin day before  Other:   Procedure date & time: 06/10/11 @ 1030

## 2011-05-25 NOTE — Telephone Encounter (Signed)
agree

## 2011-05-26 ENCOUNTER — Encounter (HOSPITAL_COMMUNITY): Payer: Self-pay | Admitting: Pharmacy Technician

## 2011-06-09 MED ORDER — SODIUM CHLORIDE 0.45 % IV SOLN
Freq: Once | INTRAVENOUS | Status: AC
  Administered 2011-06-10: 10:00:00 via INTRAVENOUS

## 2011-06-10 ENCOUNTER — Other Ambulatory Visit (INDEPENDENT_AMBULATORY_CARE_PROVIDER_SITE_OTHER): Payer: Self-pay | Admitting: Internal Medicine

## 2011-06-10 ENCOUNTER — Ambulatory Visit (HOSPITAL_COMMUNITY)
Admission: RE | Admit: 2011-06-10 | Discharge: 2011-06-10 | Disposition: A | Discharge status: Home / Self Care | Payer: Medicare Other | Source: Ambulatory Visit | Attending: Internal Medicine | Admitting: Internal Medicine

## 2011-06-10 ENCOUNTER — Encounter (HOSPITAL_COMMUNITY)
Admission: RE | Disposition: A | Discharge status: Home / Self Care | Payer: Self-pay | Source: Ambulatory Visit | Attending: Internal Medicine

## 2011-06-10 ENCOUNTER — Encounter (HOSPITAL_COMMUNITY): Payer: Self-pay | Admitting: *Deleted

## 2011-06-10 DIAGNOSIS — Z79899 Other long term (current) drug therapy: Secondary | ICD-10-CM | POA: Insufficient documentation

## 2011-06-10 DIAGNOSIS — Z860100 Personal history of colon polyps, unspecified: Secondary | ICD-10-CM

## 2011-06-10 DIAGNOSIS — D126 Benign neoplasm of colon, unspecified: Secondary | ICD-10-CM | POA: Insufficient documentation

## 2011-06-10 DIAGNOSIS — Z7982 Long term (current) use of aspirin: Secondary | ICD-10-CM | POA: Insufficient documentation

## 2011-06-10 DIAGNOSIS — E7800 Pure hypercholesterolemia, unspecified: Secondary | ICD-10-CM | POA: Insufficient documentation

## 2011-06-10 DIAGNOSIS — E119 Type 2 diabetes mellitus without complications: Secondary | ICD-10-CM | POA: Insufficient documentation

## 2011-06-10 DIAGNOSIS — K6389 Other specified diseases of intestine: Secondary | ICD-10-CM

## 2011-06-10 DIAGNOSIS — K644 Residual hemorrhoidal skin tags: Secondary | ICD-10-CM | POA: Insufficient documentation

## 2011-06-10 DIAGNOSIS — I10 Essential (primary) hypertension: Secondary | ICD-10-CM | POA: Insufficient documentation

## 2011-06-10 DIAGNOSIS — Z800 Family history of malignant neoplasm of digestive organs: Secondary | ICD-10-CM

## 2011-06-10 DIAGNOSIS — K5730 Diverticulosis of large intestine without perforation or abscess without bleeding: Secondary | ICD-10-CM

## 2011-06-10 DIAGNOSIS — Z09 Encounter for follow-up examination after completed treatment for conditions other than malignant neoplasm: Secondary | ICD-10-CM

## 2011-06-10 HISTORY — PX: COLONOSCOPY: SHX5424

## 2011-06-10 HISTORY — DX: Essential (primary) hypertension: I10

## 2011-06-10 HISTORY — DX: Pure hypercholesterolemia, unspecified: E78.00

## 2011-06-10 LAB — GLUCOSE, CAPILLARY: Glucose-Capillary: 102 mg/dL — ABNORMAL HIGH (ref 70–99)

## 2011-06-10 SURGERY — COLONOSCOPY
Anesthesia: Moderate Sedation

## 2011-06-10 MED ORDER — MEPERIDINE HCL 50 MG/ML IJ SOLN
INTRAMUSCULAR | Status: AC
  Filled 2011-06-10: qty 1

## 2011-06-10 MED ORDER — MIDAZOLAM HCL 5 MG/5ML IJ SOLN
INTRAMUSCULAR | Status: DC | PRN
  Administered 2011-06-10 (×2): 2 mg via INTRAVENOUS

## 2011-06-10 MED ORDER — MEPERIDINE HCL 50 MG/ML IJ SOLN
INTRAMUSCULAR | Status: DC | PRN
  Administered 2011-06-10 (×2): 25 mg via INTRAVENOUS

## 2011-06-10 MED ORDER — MIDAZOLAM HCL 5 MG/5ML IJ SOLN
INTRAMUSCULAR | Status: AC
  Filled 2011-06-10: qty 10

## 2011-06-10 NOTE — H&P (Signed)
Wyatt [name] is an 78 y.o. male.   Chief Complaint: Patient is here for colonoscopy. HPI: Patient is a 78 year old Caucasian male with history of colonic adenomas. Large adenoma removed in 1997 which had high-grade dysplasia. His most recent exam was in November 2007 with removal of small to adenoma from transverse colon. Feels fine. He denies abdominal pain change in his bowel habits or rectal bleeding. His mother was diagnosed and treated for colon carcinoma at age 66 father died of unrelated causes at 37.  Past Medical History  Diagnosis Date  . Hypertension   . Diabetes mellitus   . Hypercholesteremia     Past Surgical History  Procedure Date  . Cardiac catheterization     Family History  Problem Relation Age of Onset  . Colon cancer Mother    Social History:  reports that he has quit smoking. He does not have any smokeless tobacco history on file. He reports that he does not drink alcohol or use illicit drugs.  Allergies: No Known Allergies  Medications Prior to Admission  Medication Dose Route Frequency Provider Last Rate Last Dose  . 0.45 % sodium chloride infusion   Intravenous Once [name], MD 20 mL/hr at 06/10/11 0937    . meperidine (DEMEROL) 50 MG/ML injection           . midazolam (VERSED) 5 MG/5ML injection            No current outpatient prescriptions on file as of 06/10/2011.    No results found for this or any previous visit (from the past 48 hour(s)). No results found.  Review of Systems  Constitutional: Negative for weight loss.  Gastrointestinal: Negative for abdominal pain, diarrhea, constipation, blood in stool and melena.    Blood pressure 160/89, pulse 64, temperature 97.6 F (36.4 C), temperature source Oral, resp. rate 18, height 5\' 6"  (1.676 m), weight 174 lb (78.926 kg), SpO2 97.00%. Physical Exam  Constitutional: He appears well-developed and well-nourished.  HENT:  Mouth/Throat: Oropharynx is clear and moist.  Eyes: Conjunctivae  are normal. No scleral icterus.  Neck: No thyromegaly present.  Cardiovascular: Normal rate, regular rhythm and normal heart sounds.   No murmur heard. Respiratory: Effort normal and breath sounds normal.  GI: Soft. He exhibits no mass. There is no tenderness.  Musculoskeletal: He exhibits no edema.  Lymphadenopathy:    He has no cervical adenopathy.  Neurological: He is alert.  Skin: Skin is warm and dry.     Assessment/Plan History of colonic polyps. Family history of colon carcinoma. Surveillance colonoscopy  WyattNAJEEB [name] 06/10/2011, 10:17 AM

## 2011-06-10 NOTE — Op Note (Signed)
COLONOSCOPY PROCEDURE REPORT  PATIENT:  Wyatt [name]  MR#:  [id number] Birthdate:  [birth year], 78 y.o., male Endoscopist:  Dr. [name], MD Referred By:  Dr. Carylon Perches, MD Procedure Date: 06/10/2011  Procedure:   Colonoscopy  Indications:  Patient is a 78 year old Caucasian male with history of colonic adenomas and positive family history of for CRC. He enlarged tubular adenoma with high-grade dysplasia removed in 1997. His last exam was in November 2007 with removal of single tubular adenoma. Family history is positive for colon carcinoma in his mother at age 45 but she died 83 years later found related causes.  Informed Consent: The seizure and risks were reviewed with the patient and informed consent was obtained.  Medications:  Demerol 50 mg IV Versed 3 mg IV  Description of procedure:  After a digital rectal exam was performed, that colonoscope was advanced from the anus through the rectum and colon to the area of the cecum, ileocecal valve and appendiceal orifice. The cecum was deeply intubated. These structures were well-seen and photographed for the record. From the level of the cecum and ileocecal valve, the scope was slowly and cautiously withdrawn. The mucosal surfaces were carefully surveyed utilizing scope tip to flexion to facilitate fold flattening as needed. The scope was pulled down into the rectum where a thorough exam including retroflexion was performed.  Findings:   Prep satisfactory. Abnormal appearance to inferior lip of the ileocecal valve felt to be due to prolapse small bowel mucosa. Biopsy taken to make sure he does not have an adenoma. Small polyp ablated a core biopsy from proximal transverse colon. Few small diverticula and sigmoid colon. Single prominent anal papilla and small hemorrhoids below the dentate line. This papilla was also noted on last examination.  Therapeutic/Diagnostic Maneuvers Performed:  See above  Complications:  None  Cecal  Withdrawal Time:  20 minutes  Impression:  Examination performed to cecum. Abnormal appearance to inferior lip of the ileocecal valve possibly secondary to prolapse small bowel. It was biopsied for histology. Small polyp ablated via cold biopsy from proximal transverse colon. Few diverticula at sigmoid colon. Mall external hemorrhoids and single prominent anal papilla.  Recommendations:  Standard instructions given. I will be contacting patient with the results of biopsy and further recommendations.  Embrie Mikkelsen U  06/10/2011 10:57 AM  CC: Dr. Carylon Perches, MD, MD & Dr. Bonnetta Barry ref. provider found

## 2011-06-14 ENCOUNTER — Encounter (INDEPENDENT_AMBULATORY_CARE_PROVIDER_SITE_OTHER): Payer: Self-pay | Admitting: *Deleted

## 2011-06-16 ENCOUNTER — Encounter (HOSPITAL_COMMUNITY): Payer: Self-pay | Admitting: Internal Medicine

## 2012-02-18 ENCOUNTER — Ambulatory Visit (INDEPENDENT_AMBULATORY_CARE_PROVIDER_SITE_OTHER): Payer: Medicare Other | Admitting: Urology

## 2012-02-18 DIAGNOSIS — E291 Testicular hypofunction: Secondary | ICD-10-CM

## 2012-02-18 DIAGNOSIS — N529 Male erectile dysfunction, unspecified: Secondary | ICD-10-CM

## 2012-06-23 ENCOUNTER — Inpatient Hospital Stay (HOSPITAL_COMMUNITY)
Admission: EM | Admit: 2012-06-23 | Discharge: 2012-06-27 | DRG: 419 | Disposition: A | Discharge status: Home / Self Care | Payer: Medicare Other | Attending: General Surgery | Admitting: General Surgery

## 2012-06-23 ENCOUNTER — Emergency Department (HOSPITAL_COMMUNITY): Payer: Medicare Other

## 2012-06-23 ENCOUNTER — Encounter (HOSPITAL_COMMUNITY): Payer: Self-pay | Admitting: Emergency Medicine

## 2012-06-23 DIAGNOSIS — Z87891 Personal history of nicotine dependence: Secondary | ICD-10-CM

## 2012-06-23 DIAGNOSIS — I10 Essential (primary) hypertension: Secondary | ICD-10-CM | POA: Diagnosis present

## 2012-06-23 DIAGNOSIS — E119 Type 2 diabetes mellitus without complications: Secondary | ICD-10-CM | POA: Diagnosis present

## 2012-06-23 DIAGNOSIS — Z79899 Other long term (current) drug therapy: Secondary | ICD-10-CM

## 2012-06-23 DIAGNOSIS — E7800 Pure hypercholesterolemia, unspecified: Secondary | ICD-10-CM | POA: Diagnosis present

## 2012-06-23 DIAGNOSIS — K819 Cholecystitis, unspecified: Secondary | ICD-10-CM

## 2012-06-23 DIAGNOSIS — A419 Sepsis, unspecified organism: Secondary | ICD-10-CM

## 2012-06-23 DIAGNOSIS — K810 Acute cholecystitis: Principal | ICD-10-CM | POA: Diagnosis present

## 2012-06-23 DIAGNOSIS — Z7982 Long term (current) use of aspirin: Secondary | ICD-10-CM

## 2012-06-23 LAB — URINALYSIS, ROUTINE W REFLEX MICROSCOPIC
Bilirubin Urine: NEGATIVE
Glucose, UA: NEGATIVE mg/dL
Hgb urine dipstick: NEGATIVE
Specific Gravity, Urine: 1.02 (ref 1.005–1.030)
Urobilinogen, UA: 0.2 mg/dL (ref 0.0–1.0)

## 2012-06-23 LAB — COMPREHENSIVE METABOLIC PANEL
AST: 20 U/L (ref 0–37)
Albumin: 3.6 g/dL (ref 3.5–5.2)
Alkaline Phosphatase: 50 U/L (ref 39–117)
Chloride: 99 mEq/L (ref 96–112)
Potassium: 3.7 mEq/L (ref 3.5–5.1)
Sodium: 136 mEq/L (ref 135–145)
Total Bilirubin: 1 mg/dL (ref 0.3–1.2)

## 2012-06-23 LAB — CBC WITH DIFFERENTIAL/PLATELET
Basophils Absolute: 0 10*3/uL (ref 0.0–0.1)
Eosinophils Absolute: 0 10*3/uL (ref 0.0–0.7)
Eosinophils Relative: 0 % (ref 0–5)
Lymphs Abs: 0.1 10*3/uL — ABNORMAL LOW (ref 0.7–4.0)
MCH: 29.6 pg (ref 26.0–34.0)
MCV: 86.5 fL (ref 78.0–100.0)
Monocytes Absolute: 0 10*3/uL — ABNORMAL LOW (ref 0.1–1.0)
Platelets: 116 10*3/uL — ABNORMAL LOW (ref 150–400)
RDW: 13 % (ref 11.5–15.5)

## 2012-06-23 LAB — LIPASE, BLOOD: Lipase: 13 U/L (ref 11–59)

## 2012-06-23 MED ORDER — IOHEXOL 300 MG/ML  SOLN
100.0000 mL | Freq: Once | INTRAMUSCULAR | Status: AC | PRN
  Administered 2012-06-23: 100 mL via INTRAVENOUS

## 2012-06-23 MED ORDER — ONDANSETRON HCL 4 MG/2ML IJ SOLN
4.0000 mg | Freq: Four times a day (QID) | INTRAMUSCULAR | Status: DC | PRN
  Administered 2012-06-26: 4 mg via INTRAVENOUS
  Filled 2012-06-23: qty 2

## 2012-06-23 MED ORDER — ONDANSETRON HCL 4 MG/2ML IJ SOLN
4.0000 mg | Freq: Once | INTRAMUSCULAR | Status: AC
  Administered 2012-06-23: 4 mg via INTRAVENOUS
  Filled 2012-06-23: qty 2

## 2012-06-23 MED ORDER — SODIUM CHLORIDE 0.9 % IV BOLUS (SEPSIS)
1000.0000 mL | Freq: Once | INTRAVENOUS | Status: AC
  Administered 2012-06-23: 1000 mL via INTRAVENOUS

## 2012-06-23 MED ORDER — ACETAMINOPHEN 650 MG RE SUPP
650.0000 mg | Freq: Once | RECTAL | Status: AC
  Administered 2012-06-23: 650 mg via RECTAL
  Filled 2012-06-23: qty 1

## 2012-06-23 MED ORDER — BIOTENE DRY MOUTH MT LIQD
15.0000 mL | Freq: Two times a day (BID) | OROMUCOSAL | Status: DC
  Administered 2012-06-24 – 2012-06-27 (×6): 15 mL via OROMUCOSAL

## 2012-06-23 MED ORDER — IOHEXOL 300 MG/ML  SOLN
50.0000 mL | Freq: Once | INTRAMUSCULAR | Status: AC | PRN
  Administered 2012-06-23: 50 mL via ORAL

## 2012-06-23 MED ORDER — ACETAMINOPHEN 650 MG RE SUPP: 650.0000 mg | Freq: Four times a day (QID) | RECTAL | Status: DC | PRN

## 2012-06-23 MED ORDER — MORPHINE SULFATE 2 MG/ML IJ SOLN: 1.0000 mg | INTRAMUSCULAR | Status: DC | PRN

## 2012-06-23 MED ORDER — PIPERACILLIN-TAZOBACTAM 3.375 G IVPB
3.3750 g | Freq: Three times a day (TID) | INTRAVENOUS | Status: DC
  Administered 2012-06-24 – 2012-06-26 (×8): 3.375 g via INTRAVENOUS
  Filled 2012-06-23 (×11): qty 50

## 2012-06-23 MED ORDER — ACETAMINOPHEN 325 MG PO TABS
650.0000 mg | ORAL_TABLET | Freq: Four times a day (QID) | ORAL | Status: DC | PRN
  Administered 2012-06-23 – 2012-06-27 (×6): 650 mg via ORAL
  Filled 2012-06-23 (×6): qty 2

## 2012-06-23 MED ORDER — SODIUM CHLORIDE 0.9 % IV BOLUS (SEPSIS)
500.0000 mL | Freq: Once | INTRAVENOUS | Status: AC
  Administered 2012-06-23: 500 mL via INTRAVENOUS

## 2012-06-23 MED ORDER — LACTATED RINGERS IV SOLN
INTRAVENOUS | Status: DC
  Administered 2012-06-23 – 2012-06-26 (×3): via INTRAVENOUS

## 2012-06-23 MED ORDER — SODIUM CHLORIDE 0.9 % IV SOLN
Freq: Once | INTRAVENOUS | Status: AC
  Administered 2012-06-23: 20:00:00 via INTRAVENOUS

## 2012-06-23 MED ORDER — PIPERACILLIN-TAZOBACTAM 3.375 G IVPB 30 MIN
3.3750 g | Freq: Once | INTRAVENOUS | Status: AC
  Administered 2012-06-23: 3.375 g via INTRAVENOUS
  Filled 2012-06-23 (×2): qty 50

## 2012-06-23 NOTE — ED Provider Notes (Signed)
History     CSN: [id number]  Arrival date & time 06/23/12  5621   First MD Initiated Contact with Patient 06/23/12 1834      Chief Complaint  Patient presents with  . Emesis  . Abdominal Pain     Patient is a 79 y.o. male presenting with abdominal pain. The history is provided by the patient and the spouse.  Abdominal Pain The primary symptoms of the illness include abdominal pain, fever, fatigue, nausea and vomiting. The primary symptoms of the illness do not include shortness of breath, diarrhea or dysuria. The current episode started yesterday. The onset of the illness was sudden. The problem has been gradually worsening.  Additional symptoms associated with the illness include chills. Symptoms associated with the illness do not include back pain.  pt presents from home for abdominal pain and vomiting Pt reports he had sudden onset of abdominal pain yesterday and it is worsening He reports an episode of vomiting earlier. No cough.  No diarrhea.   He has tried tylenol for pain but no improvement He had otherwise been well prior to this episode.   Past Medical History  Diagnosis Date  . Hypertension   . Diabetes mellitus   . Hypercholesteremia     Past Surgical History  Procedure Date  . Cardiac catheterization   . Colonoscopy 06/10/2011    Procedure: COLONOSCOPY;  Surgeon: [name], MD;  Location: [facility];  Service: Endoscopy;  Laterality: N/A;  1030    Family History  Problem Relation Age of Onset  . Colon cancer Mother     History  Substance Use Topics  . Smoking status: Former Smoker -- 1.0 packs/day for 7 years  . Smokeless tobacco: Not on file  . Alcohol Use: No      Review of Systems  Constitutional: Positive for fever, chills and fatigue.  Respiratory: Negative for shortness of breath.   Cardiovascular: Negative for chest pain.  Gastrointestinal: Positive for nausea, vomiting and abdominal pain. Negative for diarrhea.  Genitourinary:  Negative for dysuria.  Musculoskeletal: Negative for back pain.  Neurological: Positive for weakness.  Psychiatric/Behavioral: Negative for agitation.  All other systems reviewed and are negative.    Allergies  Review of patient's allergies indicates no known allergies.  Home Medications   Current Outpatient Rx  Name  Route  Sig  Dispense  Refill  . ASPIRIN EC 81 MG PO TBEC   Oral   Take 81 mg by mouth daily.         Marland Kitchen LISINOPRIL 40 MG PO TABS   Oral   Take 40 mg by mouth daily.         Marland Kitchen METFORMIN HCL 500 MG PO TABS   Oral   Take 500 mg by mouth 2 (two) times daily with a meal.         . METOPROLOL TARTRATE 25 MG PO TABS   Oral   Take 12.5 mg by mouth 2 (two) times daily.         Marland Kitchen PEG-KCL-NACL-NASULF-NA ASC-C 100 G PO SOLR   Oral   Take 1 kit (100 g total) by mouth once.   1 kit   0   . SIMVASTATIN 20 MG PO TABS   Oral   Take 20 mg by mouth every evening.           BP 119/69  Pulse 103  Temp 104.7 F (40.4 C) (Rectal)  Resp 18  Ht 6' (1.829 m)  [employer]  174 lb (78.926 kg)  BMI 23.60 kg/m2  SpO2 93% BP 119/69  Pulse 99  Temp 102.8 F (39.3 C) (Oral)  Resp 23  Ht 6' (1.829 m)  Wt 174 lb (78.926 kg)  BMI 23.60 kg/m2  SpO2 97%   Physical Exam CONSTITUTIONAL: Well developed but ill appearing HEAD AND FACE: Normocephalic/atraumatic EYES: EOMI/PERRL, no icterus ENMT: Mucous membranes dry NECK: supple no meningeal signs SPINE:entire spine nontender CV: S1/S2 noted, no murmurs/rubs/gallops noted LUNGS: Lungs are clear to auscultation bilaterally, no apparent distress ABDOMEN:pt denies tenderness but appears to have involuntary guarding.   GU:no cva tenderness NEURO: Pt is awake/alert, moves all extremitiesx4 EXTREMITIES: pulses normal, full ROM SKIN: warm, color normal PSYCH: no abnormalities of mood noted  ED Course  Procedures  CRITICAL CARE Performed by: Joya Gaskins   Total critical care time: 33  Critical care time was  exclusive of separately billable procedures and treating other patients.  Critical care was necessary to treat or prevent imminent or life-threatening deterioration.  Critical care was time spent personally by me on the following activities: development of treatment plan with patient and/or surrogate as well as nursing, discussions with consultants, evaluation of patient's response to treatment, examination of patient, obtaining history from patient or surrogate, ordering and performing treatments and interventions, ordering and review of laboratory studies, ordering and review of radiographic studies, pulse oximetry and re-evaluation of patient's condition.    Labs Reviewed  COMPREHENSIVE METABOLIC PANEL  CBC WITH DIFFERENTIAL  LIPASE, BLOOD  LACTIC ACID, PLASMA  URINALYSIS, ROUTINE W REFLEX MICROSCOPIC  URINE CULTURE   7:09 PM Pt seen on arrival for abdominal pain and vomiting.  He is febrile.  He is ill appearing.  CXR is negative for pneumonia or free air under diaphragm.  Will likely need CT imaging, will follow closely 7:31 PM Pt has elevated lactate.  Concern for intra-abdominal process.  Will start IV antibiotics and obtain CT scan 9:42 PM Ct findings.  He is feeling improved but will require admission due to fever/elevated lactate and likely acute cholecystitis D/w dr zieglar, on for surgery will admit to hospital Patient/family updated on plan  MDM  Nursing notes including past medical history and social history reviewed and considered in documentation xrays reviewed and considered Labs/vital reviewed and considered        Date: 06/23/2012  Rate: 105  Rhythm: sinus tachycardia  QRS Axis: normal  Intervals: normal  ST/T Wave abnormalities: nonspecific ST changes  Conduction Disutrbances:none  Narrative Interpretation:   Old EKG Reviewed: unchanged    Joya Gaskins, MD 06/23/12 2148

## 2012-06-23 NOTE — ED Notes (Addendum)
Pt c/o abd pain since yesterday with n/v today. Denies diarrhea/constipation. Pt also c/o fever/chills. Rectal temp 104.7.

## 2012-06-24 LAB — GLUCOSE, CAPILLARY
Glucose-Capillary: 93 mg/dL (ref 70–99)
Glucose-Capillary: 122 mg/dL — ABNORMAL HIGH (ref 70–99)

## 2012-06-24 LAB — SURGICAL PCR SCREEN: MRSA, PCR: NEGATIVE

## 2012-06-24 MED ORDER — PIPERACILLIN-TAZOBACTAM 3.375 G IVPB
INTRAVENOUS | Status: AC
  Filled 2012-06-24: qty 100

## 2012-06-25 LAB — GLUCOSE, CAPILLARY

## 2012-06-25 MED ORDER — ENOXAPARIN SODIUM 40 MG/0.4ML ~~LOC~~ SOLN
40.0000 mg | Freq: Once | SUBCUTANEOUS | Status: AC
  Administered 2012-06-26: 40 mg via SUBCUTANEOUS
  Filled 2012-06-25: qty 0.4

## 2012-06-25 MED ORDER — SODIUM CHLORIDE 0.9 % IJ SOLN
3.0000 mL | Freq: Two times a day (BID) | INTRAMUSCULAR | Status: DC
  Administered 2012-06-25 – 2012-06-27 (×3): 3 mL via INTRAVENOUS
  Filled 2012-06-25 (×2): qty 3

## 2012-06-25 NOTE — H&P (Signed)
Wyatt [name] is an 79 y.o. male.   Chief Complaint: Epigastric pain HPI: Patient presented to [facility] with less than 24 hours of increasing epigastric and right upper quadrant abdominal pain. Patient denies any similar symptomatology in the past although has had a history of some indigestion especially with fatty greasy foods. He states his is improved since he has changed his diet in the evenings. Symptoms presented acutely. There is no significant radiation. Upon evaluation in the emergency department a lot of her symptomatology actually improved. He has had some nausea but no emesis. This is currently improved. He denies any current fevers or chills. No change in bowel movements. No melena or hematochezia. No acholic stools. No change in urination. There is questionable episode of scleral icterus per the family of the patient denies any history of jaundice.  Past Medical History  Diagnosis Date  . Hypertension   . Diabetes mellitus   . Hypercholesteremia     Past Surgical History  Procedure Laterality Date  . Cardiac catheterization    . Colonoscopy  06/10/2011    Procedure: COLONOSCOPY;  Surgeon: [name], MD;  Location: [facility];  Service: Endoscopy;  Laterality: N/A;  1030    Family History  Problem Relation Age of Onset  . Colon cancer Mother    Social History:  reports that he has quit smoking. He has quit using smokeless tobacco. He reports that he does not drink alcohol or use illicit drugs.  Allergies: No Known Allergies  Medications Prior to Admission  Medication Sig Dispense Refill  . aspirin EC 81 MG tablet Take 81 mg by mouth every morning.       Marland Kitchen lisinopril (PRINIVIL,ZESTRIL) 40 MG tablet Take 40 mg by mouth every morning.       . metFORMIN (GLUCOPHAGE) 500 MG tablet Take 500 mg by mouth 2 (two) times daily with a meal.      . metoprolol tartrate (LOPRESSOR) 25 MG tablet Take 12.5 mg by mouth 2 (two) times daily.      . nitroGLYCERIN  (NITROSTAT) 0.4 MG SL tablet Place 0.4 mg under the tongue every 5 (five) minutes as needed. For chest pain      . simvastatin (ZOCOR) 20 MG tablet Take 20 mg by mouth every morning.         Results for orders placed during the hospital encounter of 06/23/12 (from the past 48 hour(s))  COMPREHENSIVE METABOLIC PANEL     Status: Abnormal   Collection Time    06/23/12  6:46 PM      Result Value Range   Sodium 136  135 - 145 mEq/L   Potassium 3.7  3.5 - 5.1 mEq/L   Chloride 99  96 - 112 mEq/L   CO2 26  19 - 32 mEq/L   Glucose, Bld 154 (*) 70 - 99 mg/dL   BUN 15  6 - 23 mg/dL   Creatinine, Ser 1.61  0.50 - 1.35 mg/dL   Calcium 8.7  8.4 - 09.6 mg/dL   Total Protein 6.5  6.0 - 8.3 g/dL   Albumin 3.6  3.5 - 5.2 g/dL   AST 20  0 - 37 U/L   ALT 15  0 - 53 U/L   Alkaline Phosphatase 50  39 - 117 U/L   Total Bilirubin 1.0  0.3 - 1.2 mg/dL   GFR calc non Af Amer 85 (*) >90 mL/min   GFR calc Af Amer >90  >90 mL/min  Comment:            The eGFR has been calculated     using the CKD EPI equation.     This calculation has not been     validated in all clinical     situations.     eGFR's persistently     <90 mL/min signify     possible Chronic Kidney Disease.  CBC WITH DIFFERENTIAL     Status: Abnormal   Collection Time    06/23/12  6:46 PM      Result Value Range   WBC 3.1 (*) 4.0 - 10.5 K/uL   RBC 4.90  4.22 - 5.81 MIL/uL   Hemoglobin 14.5  13.0 - 17.0 g/dL   HCT 16.1  09.6 - 04.5 %   MCV 86.5  78.0 - 100.0 fL   MCH 29.6  26.0 - 34.0 pg   MCHC 34.2  30.0 - 36.0 g/dL   RDW 40.9  81.1 - 91.4 %   Platelets 116 (*) 150 - 400 K/uL   Neutrophils Relative 96 (*) 43 - 77 %   Neutro Abs 3.0  1.7 - 7.7 K/uL   Lymphocytes Relative 4 (*) 12 - 46 %   Lymphs Abs 0.1 (*) 0.7 - 4.0 K/uL   Monocytes Relative 1 (*) 3 - 12 %   Monocytes Absolute 0.0 (*) 0.1 - 1.0 K/uL   Eosinophils Relative 0  0 - 5 %   Eosinophils Absolute 0.0  0.0 - 0.7 K/uL   Basophils Relative 0  0 - 1 %   Basophils  Absolute 0.0  0.0 - 0.1 K/uL  LIPASE, BLOOD     Status: None   Collection Time    06/23/12  6:46 PM      Result Value Range   Lipase 13  11 - 59 U/L  LACTIC ACID, PLASMA     Status: Abnormal   Collection Time    06/23/12  6:46 PM      Result Value Range   Lactic Acid, Venous 4.0 (*) 0.5 - 2.2 mmol/L  URINALYSIS, ROUTINE W REFLEX MICROSCOPIC     Status: Abnormal   Collection Time    06/23/12  7:15 PM      Result Value Range   Color, Urine YELLOW  YELLOW   APPearance CLEAR  CLEAR   Specific Gravity, Urine 1.020  1.005 - 1.030   pH 8.0  5.0 - 8.0   Glucose, UA NEGATIVE  NEGATIVE mg/dL   Hgb urine dipstick NEGATIVE  NEGATIVE   Bilirubin Urine NEGATIVE  NEGATIVE   Ketones, ur TRACE (*) NEGATIVE mg/dL   Protein, ur NEGATIVE  NEGATIVE mg/dL   Urobilinogen, UA 0.2  0.0 - 1.0 mg/dL   Nitrite NEGATIVE  NEGATIVE   Leukocytes, UA NEGATIVE  NEGATIVE   Comment: MICROSCOPIC NOT DONE ON URINES WITH NEGATIVE PROTEIN, BLOOD, LEUKOCYTES, NITRITE, OR GLUCOSE <1000 mg/dL.  SURGICAL PCR SCREEN     Status: None   Collection Time    06/23/12 11:42 PM      Result Value Range   MRSA, PCR NEGATIVE  NEGATIVE   Staphylococcus aureus NEGATIVE  NEGATIVE   Comment:            The Xpert SA Assay (FDA     approved for NASAL specimens     in patients over 81 years of age),     is one component of     a comprehensive surveillance     program.  Test performance has     been validated by [facility] for patients greater     than or equal to 63 year old.     It is not intended     to diagnose infection nor to     guide or monitor treatment.  GLUCOSE, CAPILLARY     Status: None   Collection Time    06/24/12  7:49 AM      Result Value Range   Glucose-Capillary 97  70 - 99 mg/dL   Comment 1 Notify RN     Comment 2 Documented in Chart    GLUCOSE, CAPILLARY     Status: None   Collection Time    06/24/12 11:14 AM      Result Value Range   Glucose-Capillary 90  70 - 99 mg/dL   Comment 1 Notify RN      Comment 2 Documented in Chart    GLUCOSE, CAPILLARY     Status: None   Collection Time    06/24/12  4:45 PM      Result Value Range   Glucose-Capillary 93  70 - 99 mg/dL   Comment 1 Documented in Chart     Comment 2 Notify RN    GLUCOSE, CAPILLARY     Status: Abnormal   Collection Time    06/24/12  9:29 PM      Result Value Range   Glucose-Capillary 122 (*) 70 - 99 mg/dL   Comment 1 Notify RN     Comment 2 Documented in Chart    GLUCOSE, CAPILLARY     Status: None   Collection Time    06/25/12  7:42 AM      Result Value Range   Glucose-Capillary 78  70 - 99 mg/dL   Comment 1 Notify RN     Comment 2 Documented in Chart    GLUCOSE, CAPILLARY     Status: Abnormal   Collection Time    06/25/12 11:25 AM      Result Value Range   Glucose-Capillary 121 (*) 70 - 99 mg/dL   Comment 1 Notify RN     Comment 2 Documented in Chart     Ct Abdomen Pelvis W Contrast  06/23/2012  *RADIOLOGY REPORT*  Clinical Data: Emesis, abdominal pain  CT ABDOMEN AND PELVIS WITH CONTRAST  Technique:  Multidetector CT imaging of the abdomen and pelvis was performed following the standard protocol during bolus administration of intravenous contrast.  Contrast: 50mL OMNIPAQUE IOHEXOL 300 MG/ML  SOLN, OMNIPAQUE IOHEXOL 300 MG/ML  SOLN  Comparison: Ultrasound 01/27/2007  Findings: There is linear atelectasis at the right lung base.  No pericardial fluid.  Coronary calcifications noted.  There is no focal hepatic lesion.  The small amount of pericholecystic fluid.  Gallbladder wall is mildly thickened. There is a density within the neck of the gallbladder which may represent a large stone measuring 20 mm x 16 mm (image 31). Pancreas, spleen, adrenal glands, and kidneys are normal.  There is a nonenhancing cyst within the left kidney.  The stomach, small bowel, appendix, and cecum are normal.  The colon demonstrates extensive diverticular disease of the descending colon and sigmoid colon without acute inflammation.   Abdominal aorta is normal caliber with atherosclerotic calcifications.  There is no retroperitoneal periportal lymphadenopathy.  No mesenteric adenopathy.  No free fluid the pelvis.  The prostate gland and bladder normal. Prostate gland is mildly enlarged.  Small bilateral fat filled inguinal hernias.  No pelvic lymphadenopathy.  IMPRESSION: 1.  Mild gallbladder wall thickening, pericholecystic fluid, and potential large gallstone.  Findings are concerning for cholecystitis.  Consider nuclear medicine HIDA scan if equivocal or consider  further evaluation with ultrasound. 2.  Extensive sigmoid diverticulosis without evidence of acute diverticulitis.   Original Report Authenticated By: Genevive Bi, M.D.    Dg Chest Portable 1 View  06/23/2012  *RADIOLOGY REPORT*  Clinical Data: Shortness of breath  PORTABLE CHEST - 1 VIEW  Comparison: 01/09/2007  Findings: Mild elevation of the right hemidiaphragm with associated right basilar atelectasis.  No focal consolidation.  No pleural effusion or pneumothorax.  The heart is top normal in size.  IMPRESSION: No evidence of acute cardiopulmonary disease.   Original Report Authenticated By: Charline Bills, M.D.     Review of Systems  Constitutional: Negative for fever and chills.  HENT: Negative.   Eyes: Negative.   Respiratory: Negative.   Cardiovascular: Negative.   Gastrointestinal: Positive for nausea and abdominal pain (epigastric pain controlled now.). Negative for vomiting, diarrhea, constipation, blood in stool and melena.  Genitourinary: Negative.   Musculoskeletal: Negative.   Skin: Negative.   Neurological: Negative.   Endo/Heme/Allergies: Negative.   Psychiatric/Behavioral: Negative.     Blood pressure 109/64, pulse 79, temperature 99.7 F (37.6 [name]), temperature source Oral, resp. rate 18, height 6' (1.829 m), weight 77.157 kg (170 lb 1.6 oz), SpO2 100.00%. Physical Exam  Constitutional: He is oriented to person, place, and time. He appears  well-developed and well-nourished. No distress.  Elderly  HENT:  Head: Normocephalic and atraumatic.  Eyes: Conjunctivae and EOM are normal. Pupils are equal, round, and reactive to light. No scleral icterus.  Neck: Normal range of motion. Neck supple. No tracheal deviation present. No thyromegaly present.  Cardiovascular: Normal rate, regular rhythm and normal heart sounds.   Respiratory: Effort normal and breath sounds normal. No respiratory distress.  GI: Soft. Bowel sounds are normal. He exhibits no distension and no mass. There is no tenderness. There is no rebound and no guarding.  Lymphadenopathy:    He has no cervical adenopathy.  Neurological: He is alert and oriented to person, place, and time.  Skin: Skin is warm and dry.     Assessment/Plan Acute cholecystitis. At this point patient will be continued on IV antibiotic coverage. Continued on clear liquids and slowly advance as tolerated. I did discuss at length surgical options including laparoscopic cholecystectomy versus possible open cholecystectomy. At this time patient will consider surgical options. In the interim patient will be advanced on diet as tolerating. His examination is relatively benign however I do feel that patient is extremely stoic and given his emergency room workup feel patient still has significant cholecystitis cholelithiasis. Both patient and patient's family's questions and concerns were addressed. Continue IV fluid hydration. Continue DVT prophylaxis.  Wyatt [name] 06/25/2012, 12:44 PM

## 2012-06-25 NOTE — Progress Notes (Signed)
Subjective: Still without complaints of pain. Tolerating advancement of diet. No fevers or chills. She has considered options and does wish to proceed with surgical intervention Mark the  Objective: Vital signs in last 24 hours: Temp:  [97.7 F (36.5 C)-101.1 F (38.4 C)] 99.7 F (37.6 C) (02/09 0500) Pulse Rate:  [63-79] 79 (02/09 0500) Resp:  [18-20] 18 (02/09 0500) BP: (93-114)/(51-64) 109/64 mmHg (02/09 0500) SpO2:  [93 %-100 %] 100 % (02/09 0500) Last BM Date: 06/25/12  Intake/Output from previous day: 02/08 0701 - 02/09 0700 In: -  Out: 200 [Urine:200] Intake/Output this shift:    General appearance: alert and no distress GI: soft, non-tender; bowel sounds normal; no masses,  no organomegaly  Lab Results:   Recent Labs  06/23/12 1846  WBC 3.1*  HGB 14.5  HCT 42.4  PLT 116*   BMET  Recent Labs  06/23/12 1846  NA 136  K 3.7  CL 99  CO2 26  GLUCOSE 154*  BUN 15  CREATININE 0.78  CALCIUM 8.7   PT/INR No results found for this basename: LABPROT, INR,  in the last 72 hours ABG No results found for this basename: PHART, PCO2, PO2, HCO3,  in the last 72 hours  Studies/Results: Ct Abdomen Pelvis W Contrast  06/23/2012  *RADIOLOGY REPORT*  Clinical Data: Emesis, abdominal pain  CT ABDOMEN AND PELVIS WITH CONTRAST  Technique:  Multidetector CT imaging of the abdomen and pelvis was performed following the standard protocol during bolus administration of intravenous contrast.  Contrast: 50mL OMNIPAQUE IOHEXOL 300 MG/ML  SOLN, OMNIPAQUE IOHEXOL 300 MG/ML  SOLN  Comparison: Ultrasound 01/27/2007  Findings: There is linear atelectasis at the right lung base.  No pericardial fluid.  Coronary calcifications noted.  There is no focal hepatic lesion.  The small amount of pericholecystic fluid.  Gallbladder wall is mildly thickened. There is a density within the neck of the gallbladder which may represent a large stone measuring 20 mm x 16 mm (image 31). Pancreas,  spleen, adrenal glands, and kidneys are normal.  There is a nonenhancing cyst within the left kidney.  The stomach, small bowel, appendix, and cecum are normal.  The colon demonstrates extensive diverticular disease of the descending colon and sigmoid colon without acute inflammation.  Abdominal aorta is normal caliber with atherosclerotic calcifications.  There is no retroperitoneal periportal lymphadenopathy.  No mesenteric adenopathy.  No free fluid the pelvis.  The prostate gland and bladder normal. Prostate gland is mildly enlarged.  Small bilateral fat filled inguinal hernias.  No pelvic lymphadenopathy.  IMPRESSION: 1.  Mild gallbladder wall thickening, pericholecystic fluid, and potential large gallstone.  Findings are concerning for cholecystitis.  Consider nuclear medicine HIDA scan if equivocal or consider  further evaluation with ultrasound. 2.  Extensive sigmoid diverticulosis without evidence of acute diverticulitis.   Original Report Authenticated By: Genevive Bi, M.D.    Dg Chest Portable 1 View  06/23/2012  *RADIOLOGY REPORT*  Clinical Data: Shortness of breath  PORTABLE CHEST - 1 VIEW  Comparison: 01/09/2007  Findings: Mild elevation of the right hemidiaphragm with associated right basilar atelectasis.  No focal consolidation.  No pleural effusion or pneumothorax.  The heart is top normal in size.  IMPRESSION: No evidence of acute cardiopulmonary disease.   Original Report Authenticated By: Charline Bills, M.D.     Anti-infectives: Anti-infectives   Start     Dose/Rate Route Frequency Ordered Stop   06/24/12 0000  piperacillin-tazobactam (ZOSYN) IVPB 3.375 g     3.375  g 12.5 mL/hr over 240 Minutes Intravenous Every 8 hours 06/23/12 2330     06/23/12 1945  piperacillin-tazobactam (ZOSYN) IVPB 3.375 g     3.375 g 100 mL/hr over 30 Minutes Intravenous  Once 06/23/12 1932 06/23/12 2020      Assessment/Plan: s/p * No surgery found * Acute cholecystitis. Continue antibiotic  coverage for now. Continue advancement of diet and will make patient n.p.o. after midnight tonight. We'll plan to proceed to the operating room for the discussed laparoscopic cholecystectomy tomorrow.  LOS: 2 days    Daoud Lobue C 06/25/2012

## 2012-06-26 ENCOUNTER — Encounter (HOSPITAL_COMMUNITY): Payer: Self-pay | Admitting: *Deleted

## 2012-06-26 ENCOUNTER — Inpatient Hospital Stay (HOSPITAL_COMMUNITY): Payer: Medicare Other | Admitting: Anesthesiology

## 2012-06-26 ENCOUNTER — Encounter (HOSPITAL_COMMUNITY): Payer: Self-pay | Admitting: Anesthesiology

## 2012-06-26 ENCOUNTER — Encounter (HOSPITAL_COMMUNITY)
Admission: EM | Disposition: A | Discharge status: Home / Self Care | Payer: Self-pay | Source: Home / Self Care | Attending: General Surgery

## 2012-06-26 HISTORY — PX: CHOLECYSTECTOMY: SHX55

## 2012-06-26 LAB — GLUCOSE, CAPILLARY
Glucose-Capillary: 98 mg/dL (ref 70–99)
Glucose-Capillary: 99 mg/dL (ref 70–99)
Glucose-Capillary: 93 mg/dL (ref 70–99)
Glucose-Capillary: 159 mg/dL — ABNORMAL HIGH (ref 70–99)

## 2012-06-26 LAB — CBC
MCH: 29.6 pg (ref 26.0–34.0)
MCHC: 34.3 g/dL (ref 30.0–36.0)
MCV: 86.3 fL (ref 78.0–100.0)
Platelets: 132 10*3/uL — ABNORMAL LOW (ref 150–400)
RBC: 3.65 MIL/uL — ABNORMAL LOW (ref 4.22–5.81)

## 2012-06-26 LAB — BASIC METABOLIC PANEL
BUN: 9 mg/dL (ref 6–23)
BUN: 10 mg/dL (ref 6–23)
CO2: 27 mEq/L (ref 19–32)
CO2: 26 mEq/L (ref 19–32)
Calcium: 7.9 mg/dL — ABNORMAL LOW (ref 8.4–10.5)
Chloride: 104 mEq/L (ref 96–112)
Creatinine, Ser: 0.84 mg/dL (ref 0.50–1.35)
Creatinine, Ser: 0.87 mg/dL (ref 0.50–1.35)
GFR calc non Af Amer: 82 mL/min — ABNORMAL LOW (ref >90)
Glucose, Bld: 239 mg/dL — ABNORMAL HIGH (ref 70–99)
Sodium: 134 mEq/L — ABNORMAL LOW (ref 135–145)

## 2012-06-26 LAB — URINE CULTURE
Colony Count: NO GROWTH
Culture: NO GROWTH

## 2012-06-26 LAB — CBC WITH DIFFERENTIAL/PLATELET
HCT: 37.1 % — ABNORMAL LOW (ref 39.0–52.0)
Hemoglobin: 12.5 g/dL — ABNORMAL LOW (ref 13.0–17.0)
Lymphocytes Relative: 13 % (ref 12–46)
Lymphs Abs: 0.7 10*3/uL (ref 0.7–4.0)
Monocytes Absolute: 0.5 10*3/uL (ref 0.1–1.0)
Monocytes Relative: 10 % (ref 3–12)
Neutro Abs: 3.6 10*3/uL (ref 1.7–7.7)
WBC: 4.9 10*3/uL (ref 4.0–10.5)

## 2012-06-26 SURGERY — LAPAROSCOPIC CHOLECYSTECTOMY
Anesthesia: General | Wound class: Contaminated

## 2012-06-26 MED ORDER — ONDANSETRON HCL 4 MG/2ML IJ SOLN: 4.0000 mg | Freq: Once | INTRAMUSCULAR | Status: DC | PRN

## 2012-06-26 MED ORDER — BUPIVACAINE HCL (PF) 0.5 % IJ SOLN
INTRAMUSCULAR | Status: AC
  Filled 2012-06-26: qty 30

## 2012-06-26 MED ORDER — METFORMIN HCL 500 MG PO TABS
500.0000 mg | ORAL_TABLET | Freq: Two times a day (BID) | ORAL | Status: DC
  Administered 2012-06-26 – 2012-06-27 (×2): 500 mg via ORAL
  Filled 2012-06-26 (×2): qty 1

## 2012-06-26 MED ORDER — ROCURONIUM BROMIDE 100 MG/10ML IV SOLN
INTRAVENOUS | Status: DC | PRN
  Administered 2012-06-26: 35 mg via INTRAVENOUS

## 2012-06-26 MED ORDER — SODIUM CHLORIDE 0.9 % IR SOLN
Status: DC | PRN
  Administered 2012-06-26: 1000 mL

## 2012-06-26 MED ORDER — ONDANSETRON HCL 4 MG/2ML IJ SOLN
INTRAMUSCULAR | Status: AC
  Filled 2012-06-26: qty 2

## 2012-06-26 MED ORDER — GLYCOPYRROLATE 0.2 MG/ML IJ SOLN
INTRAMUSCULAR | Status: AC
  Filled 2012-06-26: qty 1

## 2012-06-26 MED ORDER — BUPIVACAINE HCL (PF) 0.5 % IJ SOLN
INTRAMUSCULAR | Status: DC | PRN
  Administered 2012-06-26: 10 mL

## 2012-06-26 MED ORDER — PROPOFOL 10 MG/ML IV EMUL
INTRAVENOUS | Status: AC
  Filled 2012-06-26: qty 20

## 2012-06-26 MED ORDER — ROCURONIUM BROMIDE 50 MG/5ML IV SOLN
INTRAVENOUS | Status: AC
  Filled 2012-06-26: qty 1

## 2012-06-26 MED ORDER — GLYCOPYRROLATE 0.2 MG/ML IJ SOLN
INTRAMUSCULAR | Status: DC | PRN
  Administered 2012-06-26: .6 mg via INTRAVENOUS

## 2012-06-26 MED ORDER — LIDOCAINE HCL (PF) 1 % IJ SOLN
INTRAMUSCULAR | Status: AC
  Filled 2012-06-26: qty 5

## 2012-06-26 MED ORDER — PROPOFOL 10 MG/ML IV BOLUS
INTRAVENOUS | Status: DC | PRN
  Administered 2012-06-26: 130 mg via INTRAVENOUS

## 2012-06-26 MED ORDER — MIDAZOLAM HCL 2 MG/2ML IJ SOLN
1.0000 mg | INTRAMUSCULAR | Status: DC | PRN
  Administered 2012-06-26: 2 mg via INTRAVENOUS

## 2012-06-26 MED ORDER — LIDOCAINE HCL (CARDIAC) 20 MG/ML IV SOLN
INTRAVENOUS | Status: DC | PRN
  Administered 2012-06-26: 40 mg via INTRAVENOUS

## 2012-06-26 MED ORDER — GLYCOPYRROLATE 0.2 MG/ML IJ SOLN
0.2000 mg | Freq: Once | INTRAMUSCULAR | Status: AC
  Administered 2012-06-26: 0.2 mg via INTRAVENOUS

## 2012-06-26 MED ORDER — MIDAZOLAM HCL 2 MG/2ML IJ SOLN
INTRAMUSCULAR | Status: AC
  Filled 2012-06-26: qty 2

## 2012-06-26 MED ORDER — FENTANYL CITRATE 0.05 MG/ML IJ SOLN
INTRAMUSCULAR | Status: AC
  Filled 2012-06-26: qty 5

## 2012-06-26 MED ORDER — LACTATED RINGERS IV SOLN
INTRAVENOUS | Status: DC
  Administered 2012-06-26: 1000 mL via INTRAVENOUS

## 2012-06-26 MED ORDER — SIMVASTATIN 20 MG PO TABS
20.0000 mg | ORAL_TABLET | Freq: Every day | ORAL | Status: DC
  Administered 2012-06-26: 20 mg via ORAL
  Filled 2012-06-26 (×2): qty 1

## 2012-06-26 MED ORDER — HEMOSTATIC AGENTS (NO CHARGE) OPTIME
TOPICAL | Status: DC | PRN
  Administered 2012-06-26: 1 via TOPICAL

## 2012-06-26 MED ORDER — FENTANYL CITRATE 0.05 MG/ML IJ SOLN: 25.0000 ug | INTRAMUSCULAR | Status: DC | PRN

## 2012-06-26 MED ORDER — ONDANSETRON HCL 4 MG/2ML IJ SOLN
4.0000 mg | Freq: Once | INTRAMUSCULAR | Status: AC
  Administered 2012-06-26: 4 mg via INTRAVENOUS

## 2012-06-26 MED ORDER — HYDROCODONE-ACETAMINOPHEN 5-325 MG PO TABS: 1.0000 | ORAL_TABLET | ORAL | Status: DC | PRN

## 2012-06-26 MED ORDER — CELECOXIB 100 MG PO CAPS
200.0000 mg | ORAL_CAPSULE | Freq: Two times a day (BID) | ORAL | Status: DC
  Administered 2012-06-26 – 2012-06-27 (×3): 200 mg via ORAL
  Filled 2012-06-26 (×3): qty 2

## 2012-06-26 MED ORDER — METOPROLOL TARTRATE 25 MG PO TABS
12.5000 mg | ORAL_TABLET | Freq: Two times a day (BID) | ORAL | Status: DC
  Administered 2012-06-26 – 2012-06-27 (×2): 12.5 mg via ORAL
  Filled 2012-06-26 (×2): qty 1

## 2012-06-26 MED ORDER — FENTANYL CITRATE 0.05 MG/ML IJ SOLN
INTRAMUSCULAR | Status: DC | PRN
  Administered 2012-06-26 (×5): 50 ug via INTRAVENOUS

## 2012-06-26 MED ORDER — LACTATED RINGERS IV BOLUS (SEPSIS)
1000.0000 mL | Freq: Once | INTRAVENOUS | Status: AC
  Administered 2012-06-26: 1000 mL via INTRAVENOUS

## 2012-06-26 MED ORDER — NEOSTIGMINE METHYLSULFATE 1 MG/ML IJ SOLN
INTRAMUSCULAR | Status: DC | PRN
  Administered 2012-06-26: 3 mg via INTRAVENOUS

## 2012-06-26 MED ORDER — MORPHINE SULFATE 2 MG/ML IJ SOLN
1.0000 mg | INTRAMUSCULAR | Status: DC | PRN
  Administered 2012-06-26 (×2): 1 mg via INTRAVENOUS
  Filled 2012-06-26 (×2): qty 1

## 2012-06-26 MED ORDER — GLYCOPYRROLATE 0.2 MG/ML IJ SOLN
INTRAMUSCULAR | Status: AC
  Filled 2012-06-26: qty 3

## 2012-06-26 MED ORDER — LISINOPRIL 10 MG PO TABS
40.0000 mg | ORAL_TABLET | Freq: Every morning | ORAL | Status: DC
  Administered 2012-06-26 – 2012-06-27 (×2): 40 mg via ORAL
  Filled 2012-06-26 (×2): qty 4

## 2012-06-26 SURGICAL SUPPLY — 43 items
APL SKNCLS STERI-STRIP NONHPOA (GAUZE/BANDAGES/DRESSINGS) ×1
APPLIER CLIP UNV 5X34 EPIX (ENDOMECHANICALS) ×2 IMPLANT
APR XCLPCLP 20M/L UNV 34X5 (ENDOMECHANICALS) ×1
BAG HAMPER (MISCELLANEOUS) ×2 IMPLANT
BENZOIN TINCTURE PRP APPL 2/3 (GAUZE/BANDAGES/DRESSINGS) ×2 IMPLANT
CLOTH BEACON ORANGE TIMEOUT ST (SAFETY) ×2 IMPLANT
COVER LIGHT HANDLE STERIS (MISCELLANEOUS) ×4 IMPLANT
DECANTER SPIKE VIAL GLASS SM (MISCELLANEOUS) ×2 IMPLANT
DEVICE TROCAR PUNCTURE CLOSURE (ENDOMECHANICALS) ×2 IMPLANT
DURAPREP 26ML APPLICATOR (WOUND CARE) ×2 IMPLANT
ELECT REM PT RETURN 9FT ADLT (ELECTROSURGICAL) ×2
ELECTRODE REM PT RTRN 9FT ADLT (ELECTROSURGICAL) ×1 IMPLANT
FILTER SMOKE EVAC LAPAROSHD (FILTER) ×2 IMPLANT
FORMALIN 10 PREFIL 120ML (MISCELLANEOUS) ×2 IMPLANT
GAUZE SPONGE 2X2 8PLY STRL LF (GAUZE/BANDAGES/DRESSINGS) ×1 IMPLANT
GLOVE BIOGEL PI IND STRL 7.0 (GLOVE) ×4 IMPLANT
GLOVE BIOGEL PI IND STRL 7.5 (GLOVE) ×2 IMPLANT
GLOVE BIOGEL PI INDICATOR 7.0 (GLOVE) ×4
GLOVE BIOGEL PI INDICATOR 7.5 (GLOVE) ×2
GLOVE ECLIPSE 7.0 STRL STRAW (GLOVE) ×2 IMPLANT
GLOVE SS BIOGEL STRL SZ 6.5 (GLOVE) ×2 IMPLANT
GLOVE SUPERSENSE BIOGEL SZ 6.5 (GLOVE) ×2
GOWN STRL REIN XL XLG (GOWN DISPOSABLE) ×6 IMPLANT
HEMOSTAT SNOW SURGICEL 2X4 (HEMOSTASIS) ×2 IMPLANT
INST SET LAPROSCOPIC AP (KITS) ×2 IMPLANT
IV NS IRRIG 3000ML ARTHROMATIC (IV SOLUTION) IMPLANT
KIT ROOM TURNOVER APOR (KITS) ×2 IMPLANT
MANIFOLD NEPTUNE II (INSTRUMENTS) ×2 IMPLANT
NEEDLE INSUFFLATION 14GA 120MM (NEEDLE) ×2 IMPLANT
PACK LAP CHOLE LZT030E (CUSTOM PROCEDURE TRAY) ×2 IMPLANT
PAD ARMBOARD 7.5X6 YLW CONV (MISCELLANEOUS) ×2 IMPLANT
POUCH SPECIMEN RETRIEVAL 10MM (ENDOMECHANICALS) ×2 IMPLANT
SET BASIN LINEN APH (SET/KITS/TRAYS/PACK) ×2 IMPLANT
SET TUBE IRRIG SUCTION NO TIP (IRRIGATION / IRRIGATOR) IMPLANT
SLEEVE Z-THREAD 5X100MM (TROCAR) ×4 IMPLANT
SPONGE GAUZE 2X2 STER 10/PKG (GAUZE/BANDAGES/DRESSINGS) ×1
STRIP CLOSURE SKIN 1/2X4 (GAUZE/BANDAGES/DRESSINGS) ×2 IMPLANT
SUT MNCRL AB 4-0 PS2 18 (SUTURE) ×4 IMPLANT
SUT VIC AB 2-0 CT2 27 (SUTURE) ×4 IMPLANT
TROCAR Z-THRD FIOS HNDL 11X100 (TROCAR) ×2 IMPLANT
TROCAR Z-THREAD FIOS 5X100MM (TROCAR) ×2 IMPLANT
WARMER LAPAROSCOPE (MISCELLANEOUS) ×2 IMPLANT
YANKAUER SUCT BULB TIP 10FT TU (MISCELLANEOUS) ×2 IMPLANT

## 2012-06-26 NOTE — Op Note (Signed)
Patient:  Wyatt [name]  DOB:  11-29-1933  MRN:  [id number]   Preop Diagnosis:  Acute cholecystitis  Postop Diagnosis:  The same  Procedure:  Laparoscopic cholecystectomy  Surgeon:  Dr. Tilford Pillar  Anes:  General endotracheal  Indications:  Patient is a 79 year male presented to [facility] with a history of epigastric abdominal pain. Workup and evaluation was consistent for acute cholecystitis. Risks benefits alternatives of a laparoscopic possible open cholecystectomy were discussed at length with the patient including but not limited to the risk of bleeding, infection, bile leak, small bowel, common duct injury, intraoperative cardiac and pulmonary events. Patient's questions and concerns are addressed the patient as consented for the planned procedure.  Procedure note:  Patient is taken to the operating room was placed in supine position the or table time the general anesthetic is a Optician, dispensing. Once patient was asleep she symmetrically intubated by the nurse anesthetist. At this point his abdomen is prepped with DuraPrep solution and draped in standard fashion. Time out was performed. Stab incision was created supraumbilically with 11 blade scalpel with additional dissection down to subcuticular tissue carried out using a Coker clamp. The clamp was then utilized to grasp the anterior normal fascia and lift this anteriorly. A Veress needle is inserted saline drop test is utilized confirm intraperitoneal placement and then pneumoperitoneum was initiated. Once sufficient pneumoperitoneum was obtained an 11 mm trochars inserted over laparoscope allowing visualization the trocar entering into the peritoneal cavity. At this point the inner cannula was removed the laparoscope was reinserted there is no evidence of any trocar or Veress needle placement injury. At this time the remaining trochars replaced a 5 mm can epigastrium, 5 mm in the midline, and a 5 mm in the right lateral abdominal  wall. Patient's placed into a reverse Trendelenburg left lateral decubitus position. The fundus of the gallbladder is grasped a regular grasper and was lifted up and over the right lobe the liver. Several omental adhesions are bluntly stripped using a [place] dissector to her off of the body of the gallbladder. The peritoneal flexion onto the infundibulum was bluntly stripped using a [place] exposing both the cystic duct and cystic artery is entered into the infundibulum. A window was created behind both the cystic duct and cystic artery. 3 endoclips placed proximally one distally and the cystic artery and the cystic artery is divided between 2 most distal clips. Similarly 3 endoclips placed proximally and one distally and the cystic duct and the cystic duct was divided between 2 most distal clips. At this time the gallbladder is dissected off the gallbladder fossa using electrocautery. Hemostasis is obtained during the dissection using electrocautery. Once gallbladder is free is placed into an Endo Catch bag. To facilitate this the 10 mm scope is exchanged for a 5 mm scope. The gallbladder M.D. intact Endo Catch bag is placed into the right lower quadrant. At this time I inspected the gallbladder fossa. Due to the raw nature I opted to place a piece of Surgicel snow into the gallbladder fossa. Hemostasis was good. The endoclips were noted be in excellent position with no evidence of any bleeding or bile leak. At this time her my attention to closure.  Using Endo Close suture passing device a 2-0 Vicryl sutures passed to the umbilical trocar site. With this suture and placed the gallbladder was treated and was removed through the umbilical trocar site and intact Endo Catch bag. The pneumoperitoneum was then activated. The trochars were  removed. The Vicryl suture was secured. A 4-0 Monocryl utilized reapproximate the skin edges at all 4 trocar sites after the local anesthetic had been instilled. The  skin was washed dried moist dry towel. Benzoin is applied around incision. Half-inch are suture placed. The drapes removed. The patient left come out of general anesthetic and was extubated. He is transferred to the PACU in stable condition. At the conclusion of procedure all instrument, sponge, needle counts are correct. Patient tolerated procedure she well.  Complications:  None apparent  EBL:  Less than 50 ML's  Specimen:  Gallbladder

## 2012-06-26 NOTE — Preoperative (Signed)
Beta Blockers   Reason not to administer Beta Blockers:Hold beta blocker due to otherHeart rate 55-60, normal BP, well beta blocked

## 2012-06-26 NOTE — Progress Notes (Signed)
UR Chart Review Completed  

## 2012-06-26 NOTE — Anesthesia Preprocedure Evaluation (Signed)
Anesthesia Evaluation  Patient identified by MRN, date of birth, ID band Patient awake    Reviewed: Allergy & Precautions, H&P , NPO status , Patient's Chart, lab work & pertinent test results, reviewed documented beta blocker date and time   Airway Mallampati: III TM Distance: >3 FB Neck ROM: Full    Dental no notable dental hx. (+) Teeth Intact   Pulmonary neg pulmonary ROS,    Pulmonary exam normal       Cardiovascular hypertension, + CAD Rhythm:Regular Rate:Bradycardia     Neuro/Psych negative neurological ROS     GI/Hepatic negative GI ROS, Neg liver ROS,   Endo/Other  diabetes, Well Controlled, Type 2, Oral Hypoglycemic Agents  Renal/GU negative Renal ROS     Musculoskeletal negative musculoskeletal ROS (+)   Abdominal Normal abdominal exam  (+)   Peds  Hematology  (+) Blood dyscrasia, anemia ,   Anesthesia Other Findings   Reproductive/Obstetrics                           Anesthesia Physical Anesthesia Plan  ASA: II  Anesthesia Plan: General   Post-op Pain Management:    Induction: Intravenous  Airway Management Planned: Oral ETT  Additional Equipment:   Intra-op Plan:   Post-operative Plan: Extubation in OR  Informed Consent: I have reviewed the patients History and Physical, chart, labs and discussed the procedure including the risks, benefits and alternatives for the proposed anesthesia with the patient or authorized representative who has indicated his/her understanding and acceptance.     Plan Discussed with: CRNA  Anesthesia Plan Comments:         Anesthesia Quick Evaluation

## 2012-06-26 NOTE — Care Management Note (Unsigned)
    Page 1 of 1   06/26/2012     1:22:44 PM   CARE MANAGEMENT NOTE 06/26/2012  Patient:  SAYVON, ARTERBERRY   Account Number:  [id number]  Date Initiated:  06/26/2012  Documentation initiated by:  Sharrie Rothman  Subjective/Objective Assessment:   Pt admitted from home with acute cholecystitis. Pt lives with his wife and will return home at discharge. Pt is independent with ADL's. Pt having surgery today.     Action/Plan:   No CM or HH needs noted.   Anticipated DC Date:  06/27/2012   Anticipated DC Plan:  HOME/SELF CARE      DC Planning Services  CM consult      Choice offered to / List presented to:             Status of service:  Completed, signed off Medicare Important Message given?   (If response is "NO", the following Medicare IM given date fields will be blank) Date Medicare IM given:   Date Additional Medicare IM given:    Discharge Disposition:  HOME/SELF CARE  Per UR Regulation:    If discussed at Long Length of Stay Meetings, dates discussed:    Comments:  06/26/12 1322 Arlyss Queen, RN BSN CM

## 2012-06-26 NOTE — Anesthesia Procedure Notes (Signed)
Procedure Name: Intubation Performed by: Moshe Salisbury Pre-anesthesia Checklist: Patient identified, Patient being monitored, Timeout performed, Emergency Drugs available and Suction available Patient Re-evaluated:Patient Re-evaluated prior to inductionOxygen Delivery Method: Circle System Utilized Preoxygenation: Pre-oxygenation with 100% oxygen Intubation Type: IV induction Ventilation: Mask ventilation without difficulty Laryngoscope Size: Mac and 3 Grade View: Grade I Tube type: Oral Tube size: 7.0 mm Number of attempts: 1 Airway Equipment and Method: stylet Placement Confirmation: ETT inserted through vocal cords under direct vision,  positive ETCO2 and breath sounds checked- equal and bilateral Secured at: 22 (22) cm Tube secured with: Tape Dental Injury: Teeth and Oropharynx as per pre-operative assessment

## 2012-06-26 NOTE — Transfer of Care (Signed)
Immediate Anesthesia Transfer of Care Note  Patient: Wyatt [name]  Procedure(s) Performed: Procedure(s): LAPAROSCOPIC CHOLECYSTECTOMY (N/A)  Patient Location: PACU  Anesthesia Type:General  Level of Consciousness: awake  Airway & Oxygen Therapy: Patient Spontanous Breathing and Patient connected to face mask oxygen  Post-op Assessment: Report given to PACU RN  Post vital signs: Reviewed and stable  Complications: No apparent anesthesia complications

## 2012-06-26 NOTE — Progress Notes (Signed)
Patient had witnessed syncope episode. BP lying 94/59, sitting 80/46, attempted standing and patient had another syncope episode. Dr. Leticia Penna notified, new orders for CBC stat, Bmet stat, and I liter bolus of lactated ringers.

## 2012-06-26 NOTE — Anesthesia Postprocedure Evaluation (Signed)
  Anesthesia Post-op Note  Patient: Wyatt [name]  Procedure(s) Performed: Procedure(s): LAPAROSCOPIC CHOLECYSTECTOMY (N/A)  Patient Location: PACU  Anesthesia Type:General  Level of Consciousness: awake, alert  and oriented  Airway and Oxygen Therapy: Patient Spontanous Breathing and Patient connected to face mask oxygen  Post-op Pain: mild  Post-op Assessment: Post-op Vital signs reviewed, Patient's Cardiovascular Status Stable, Respiratory Function Stable, Patent Airway and No signs of Nausea or vomiting  Post-op Vital Signs: Reviewed and stable 130/71 72 99 17  Complications: No apparent anesthesia complications

## 2012-06-26 NOTE — Progress Notes (Signed)
Day of Surgery   Subjective: No acute change.  No new questions.  Objective: Vital signs in last 24 hours: Temp:  [98.1 F (36.7 C)-98.6 F (37 C)] 98.6 F (37 C) (02/10 0754) Pulse Rate:  [62-64] 64 (02/10 0700) Resp:  [15-21] 21 (02/10 0845) BP: (134-154)/(70-81) 140/80 mmHg (02/10 0845) SpO2:  [94 %-97 %] 94 % (02/10 0845) Last BM Date: 06/25/12  Intake/Output from previous day: 02/09 0701 - 02/10 0700 In: 1910.4 [P.O.:640; I.V.:1270.4] Out: -  Intake/Output this shift:    General appearance: alert and no distress Resp: clear to auscultation bilaterally Cardio: regular rate and rhythm GI: soft, non-tender; bowel sounds normal; no masses,  no organomegaly  Lab Results:   Recent Labs  06/23/12 1846 06/26/12 0525  WBC 3.1* 4.9  HGB 14.5 12.5*  HCT 42.4 37.1*  PLT 116* 107*   BMET  Recent Labs  06/23/12 1846 06/26/12 0525  NA 136 139  K 3.7 3.4*  CL 99 104  CO2 26 27  GLUCOSE 154* 91  BUN 15 9  CREATININE 0.78 0.84  CALCIUM 8.7 8.2*   PT/INR No results found for this basename: LABPROT, INR,  in the last 72 hours ABG No results found for this basename: PHART, PCO2, PO2, HCO3,  in the last 72 hours  Studies/Results: No results found.  Anti-infectives: Anti-infectives   Start     Dose/Rate Route Frequency Ordered Stop   06/24/12 0000  [MAR Hold]  piperacillin-tazobactam (ZOSYN) IVPB 3.375 g     (On MAR Hold since 06/26/12 0801)   3.375 g 12.5 mL/hr over 240 Minutes Intravenous Every 8 hours 06/23/12 2330     06/23/12 1945  piperacillin-tazobactam (ZOSYN) IVPB 3.375 g     3.375 g 100 mL/hr over 30 Minutes Intravenous  Once 06/23/12 1932 06/23/12 2020      Assessment/Plan: s/p Procedure(s): LAPAROSCOPIC CHOLECYSTECTOMY (N/A) Acute cholecystitis.  Will proceed to the OR as discussed.  Risks, benefits, alternatives again discussed.  LOS: 3 days    Emad Brechtel C 06/26/2012

## 2012-06-27 ENCOUNTER — Encounter (HOSPITAL_COMMUNITY): Payer: Self-pay | Admitting: General Surgery

## 2012-06-27 MED ORDER — HYDROCODONE-ACETAMINOPHEN 5-325 MG PO TABS: 1.0000 | ORAL_TABLET | ORAL | Status: DC | PRN

## 2012-06-27 NOTE — Progress Notes (Signed)
Discharge instructions and prescription given, verbalized understanding, out in stable condition via w/c with staff. 

## 2012-06-27 NOTE — Progress Notes (Signed)
Inpatient Diabetes Program Recommendations  AACE/ADA: New Consensus Statement on Inpatient Glycemic Control (2013)  Target Ranges:  Prepandial:   less than 140 mg/dL      Peak postprandial:   less than 180 mg/dL (1-2 hours)      Critically ill patients:  140 - 180 mg/dL   Results for DEAIRE, MCWHIRTER (MRN [id number]) as of 06/27/2012 08:11  Ref. Range 06/26/2012 18:55  Glucose Latest Range: 70-99 mg/dL 409 (H)    Inpatient Diabetes Program Recommendations Correction (SSI): Please consider ordering CBGs ACHS with Novolog correction if needed while inpatient. HgbA1C: May want to consider ordering an A1C to determine glycemic control over the last 2-3 months.  Last A1C was 6.6% on 01/09/2007.  Note: Patient has a history of diabetes and takes Metformin 500 mg BID at home for diabetes management.  Currently ordered Metformin 500 mg BID for inpatient glycemic control.  Please consider ordering CBGs ACHS with Novolog correction if needed while inpatient.  May also want to consider ordering an A1C.  Will continue to follow.  Thanks, Orlando Penner, RN, BSN, CCRN Diabetes Coordinator Inpatient Diabetes Program [phone]

## 2012-06-27 NOTE — Discharge Summary (Signed)
Physician Discharge Summary  Patient ID: Wyatt [name] MRN: [id number] DOB/AGE: [birth year] 79 y.o.  Admit date: 06/23/2012 Discharge date: 06/27/2012  Admission Diagnoses:  Acute cholecystitis   Discharge Diagnoses:   The same Active Problems:   * No active hospital problems. *   Discharged Condition: stable  Hospital Course: Patient presented with RUQ pain.  Work-up consistent with acute cholecystitis.  Patient taken to the OR with out issues.  Transferred back to floor.  Advanced on diet.  Patient did have some post-operative orthostasis but this has improved.  NO evidence of anemia.  Patient tolerating diet.  Pain controlled.  Will plan to discharge to home.  Consults: None  Significant Diagnostic Studies: radiology: CT scan: abd pel  Treatments: IV hydration and surgery: l/s cholecystectomy  Discharge Exam: Blood pressure 118/64, pulse 87, temperature 98 F (36.7 C), temperature source Oral, resp. rate 18, height 6' (1.829 m), weight 77.157 kg (170 lb 1.6 oz), SpO2 95.00%. General appearance: alert and no distress Resp: clear to auscultation bilaterally Cardio: regular rate and rhythm GI: +BS, soft, expected post-operative tenderness.  Incision c/d/i.    Disposition: 01-Home or Self Care  Discharge Orders   Future Orders Complete By Expires     Call MD for:  difficulty breathing, headache or visual disturbances  As directed     Call MD for:  persistant nausea and vomiting  As directed     Call MD for:  redness, tenderness, or signs of infection (pain, swelling, redness, odor or green/yellow discharge around incision site)  As directed     Call MD for:  severe uncontrolled pain  As directed     Call MD for:  temperature >100.4  As directed     Diet - low sodium heart healthy  As directed     Discharge instructions  As directed     Comments:      Increase activity as tolerated. May place ice pack for comfort.  Alternate an anti-inflammatory such as ibuprofen (Motrin,  Advil) 400-600mg  every 6 hours with the prescribed pain medication.   Do not take any additional acetaminophen as there is Tylenol in the pain medication.    Discharge wound care:  As directed     Comments:      Clean surgical sites with soap and water.  May shower the morning after surgery unless instructed by Dr. Leticia Penna otherwise.  No soaking for 2-3 weeks.    If adhesive strips are in place, they may be removed in 1-2 weeks while in the shower.    Driving Restrictions  As directed     Comments:      No driving while on pain medications.    Increase activity slowly  As directed     Lifting restrictions  As directed     Comments:      No lifting over 20lbs for 4-5 weeks post-op.        Medication List    TAKE these medications       aspirin EC 81 MG tablet  Take 81 mg by mouth every morning.     HYDROcodone-acetaminophen 5-325 MG per tablet  Commonly known as:  NORCO/VICODIN  Take 1-2 tablets by mouth every 4 (four) hours as needed.     lisinopril 40 MG tablet  Commonly known as:  PRINIVIL,ZESTRIL  Take 40 mg by mouth every morning.     metFORMIN 500 MG tablet  Commonly known as:  GLUCOPHAGE  Take 500 mg by mouth 2 (  two) times daily with a meal.     metoprolol tartrate 25 MG tablet  Commonly known as:  LOPRESSOR  Take 12.5 mg by mouth 2 (two) times daily.     nitroGLYCERIN 0.4 MG SL tablet  Commonly known as:  NITROSTAT  Place 0.4 mg under the tongue every 5 (five) minutes as needed. For chest pain     simvastatin 20 MG tablet  Commonly known as:  ZOCOR  Take 20 mg by mouth every morning.         SignedFabio Bering 06/27/2012, 1:56 PM

## 2012-06-29 ENCOUNTER — Encounter (HOSPITAL_COMMUNITY): Payer: Self-pay | Admitting: Emergency Medicine

## 2012-06-29 ENCOUNTER — Emergency Department (HOSPITAL_COMMUNITY): Payer: Medicare Other

## 2012-06-29 ENCOUNTER — Emergency Department (HOSPITAL_COMMUNITY)
Admission: EM | Admit: 2012-06-29 | Discharge: 2012-06-29 | Disposition: A | Discharge status: Home / Self Care | Payer: Medicare Other | Attending: Emergency Medicine | Admitting: Emergency Medicine

## 2012-06-29 DIAGNOSIS — Z7982 Long term (current) use of aspirin: Secondary | ICD-10-CM | POA: Insufficient documentation

## 2012-06-29 DIAGNOSIS — E7800 Pure hypercholesterolemia, unspecified: Secondary | ICD-10-CM | POA: Insufficient documentation

## 2012-06-29 DIAGNOSIS — T148XXA Other injury of unspecified body region, initial encounter: Secondary | ICD-10-CM

## 2012-06-29 DIAGNOSIS — E119 Type 2 diabetes mellitus without complications: Secondary | ICD-10-CM | POA: Insufficient documentation

## 2012-06-29 DIAGNOSIS — Z87891 Personal history of nicotine dependence: Secondary | ICD-10-CM | POA: Insufficient documentation

## 2012-06-29 DIAGNOSIS — IMO0002 Reserved for concepts with insufficient information to code with codable children: Secondary | ICD-10-CM | POA: Insufficient documentation

## 2012-06-29 DIAGNOSIS — I10 Essential (primary) hypertension: Secondary | ICD-10-CM | POA: Insufficient documentation

## 2012-06-29 DIAGNOSIS — S301XXA Contusion of abdominal wall, initial encounter: Secondary | ICD-10-CM | POA: Insufficient documentation

## 2012-06-29 DIAGNOSIS — Z9089 Acquired absence of other organs: Secondary | ICD-10-CM | POA: Insufficient documentation

## 2012-06-29 DIAGNOSIS — Z79899 Other long term (current) drug therapy: Secondary | ICD-10-CM | POA: Insufficient documentation

## 2012-06-29 DIAGNOSIS — Y939 Activity, unspecified: Secondary | ICD-10-CM | POA: Insufficient documentation

## 2012-06-29 DIAGNOSIS — R5381 Other malaise: Secondary | ICD-10-CM | POA: Insufficient documentation

## 2012-06-29 DIAGNOSIS — Y929 Unspecified place or not applicable: Secondary | ICD-10-CM | POA: Insufficient documentation

## 2012-06-29 LAB — CBC WITH DIFFERENTIAL/PLATELET
Basophils Relative: 1 % (ref 0–1)
Eosinophils Absolute: 0.3 10*3/uL (ref 0.0–0.7)
Eosinophils Relative: 5 % (ref 0–5)
Lymphs Abs: 1.1 10*3/uL (ref 0.7–4.0)
MCH: 29.8 pg (ref 26.0–34.0)
MCHC: 34.2 g/dL (ref 30.0–36.0)
MCV: 87 fL (ref 78.0–100.0)
Platelets: 183 10*3/uL (ref 150–400)
RBC: 2.99 MIL/uL — ABNORMAL LOW (ref 4.22–5.81)

## 2012-06-29 LAB — COMPREHENSIVE METABOLIC PANEL
ALT: 38 U/L (ref 0–53)
Albumin: 3 g/dL — ABNORMAL LOW (ref 3.5–5.2)
BUN: 7 mg/dL (ref 6–23)
Calcium: 8.9 mg/dL (ref 8.4–10.5)
GFR calc Af Amer: >90 mL/min (ref >90)
Glucose, Bld: 125 mg/dL — ABNORMAL HIGH (ref 70–99)
Sodium: 142 mEq/L (ref 135–145)
Total Protein: 5.9 g/dL — ABNORMAL LOW (ref 6.0–8.3)

## 2012-06-29 LAB — PROTIME-INR: Prothrombin Time: 12.6 seconds (ref 11.6–15.2)

## 2012-06-29 MED ORDER — IOHEXOL 300 MG/ML  SOLN
100.0000 mL | Freq: Once | INTRAMUSCULAR | Status: AC | PRN
  Administered 2012-06-29: 100 mL via INTRAVENOUS

## 2012-06-29 MED ORDER — SODIUM CHLORIDE 0.9 % IV SOLN
INTRAVENOUS | Status: DC
  Administered 2012-06-29: 18:00:00 via INTRAVENOUS

## 2012-06-29 MED ORDER — SODIUM CHLORIDE 0.9 % IV BOLUS (SEPSIS)
250.0000 mL | Freq: Once | INTRAVENOUS | Status: AC
  Administered 2012-06-29: 250 mL via INTRAVENOUS

## 2012-06-29 MED ORDER — IOHEXOL 300 MG/ML  SOLN
50.0000 mL | Freq: Once | INTRAMUSCULAR | Status: AC | PRN
  Administered 2012-06-29: 50 mL via ORAL

## 2012-06-29 MED ORDER — ONDANSETRON HCL 4 MG/2ML IJ SOLN
4.0000 mg | Freq: Once | INTRAMUSCULAR | Status: AC
  Administered 2012-06-29: 4 mg via INTRAVENOUS
  Filled 2012-06-29: qty 2

## 2012-06-29 NOTE — ED Notes (Signed)
Patient brought in via EMS for orthostatic hypotension and large contusion to right flank. Per patient takes aspirin. Contusion from right armpit to right hip. Patient had gallbladder removed on Monday and was released from here yesterday. Lap incisions healing-steri strips in place. Patient denies dizziness but reports feeling weak.

## 2012-06-29 NOTE — ED Provider Notes (Addendum)
History     CSN: [id number]  Arrival date & time 06/29/12  1617   First MD Initiated Contact with Patient 06/29/12 1622      Chief Complaint  Patient presents with  . Hypotension  . Illegal value: [    contusion    (Consider location/radiation/quality/duration/timing/severity/associated sxs/prior treatment) The history is provided by the patient, the spouse and a relative.   79 year old male presents to the emergency department with a large amount of bruising to his right flank area. Patient is status post a laparoscopic cholecystectomy by Dr. Leticia Penna on Monday. Discharged home on Tuesday. Spouse noted bruising of the right flank area yesterday may of been there before. By reports the bruising has gotten bigger today. Here with family members. Patient denies any pain other than some localized abdominal pain no nausea no vomiting no diarrhea no lightheadedness no dizziness does not feel like his to pass out for shortness of breath no chest pain. He does have some fatigue with walking but it is not severe. Patient is tolerating food and liquids fine.  Past Medical History  Diagnosis Date  . Hypertension   . Diabetes mellitus   . Hypercholesteremia     Past Surgical History  Procedure Laterality Date  . Cardiac catheterization    . Colonoscopy  06/10/2011    Procedure: COLONOSCOPY;  Surgeon: [name], MD;  Location: [facility];  Service: Endoscopy;  Laterality: N/A;  1030  . Cholecystectomy N/A 06/26/2012    Procedure: LAPAROSCOPIC CHOLECYSTECTOMY;  Surgeon: Fabio Bering, MD;  Location: [facility];  Service: General;  Laterality: N/A;    Family History  Problem Relation Age of Onset  . Colon cancer Mother     History  Substance Use Topics  . Smoking status: Former Smoker -- 1.00 packs/day for 7 years  . Smokeless tobacco: Former Neurosurgeon  . Alcohol Use: No      Review of Systems  Constitutional: Positive for fatigue. Negative for fever, chills and diaphoresis.   HENT: Negative for congestion.   Eyes: Negative for visual disturbance.  Respiratory: Negative for shortness of breath.   Cardiovascular: Negative for chest pain.  Gastrointestinal: Positive for abdominal pain. Negative for nausea, vomiting and diarrhea.  Genitourinary: Negative for dysuria.  Musculoskeletal: Negative for back pain.  Skin: Positive for wound. Negative for rash.  Neurological: Negative for dizziness, syncope, weakness, light-headedness and headaches.  Hematological: Does not bruise/bleed easily.    Allergies  Review of patient's allergies indicates no known allergies.  Home Medications   Current Outpatient Rx  Name  Route  Sig  Dispense  Refill  . aspirin EC 81 MG tablet   Oral   Take 81 mg by mouth every morning.          Marland Kitchen HYDROcodone-acetaminophen (NORCO/VICODIN) 5-325 MG per tablet   Oral   Take 1-2 tablets by mouth every 4 (four) hours as needed.   45 tablet   0   . ibuprofen (ADVIL,MOTRIN) 200 MG tablet   Oral   Take 400 mg by mouth every 6 (six) hours as needed for pain.         Marland Kitchen lisinopril (PRINIVIL,ZESTRIL) 40 MG tablet   Oral   Take 40 mg by mouth every morning.          . metFORMIN (GLUCOPHAGE) 500 MG tablet   Oral   Take 500 mg by mouth 2 (two) times daily with a meal.         . metoprolol tartrate (  LOPRESSOR) 25 MG tablet   Oral   Take 12.5 mg by mouth 2 (two) times daily.         . nitroGLYCERIN (NITROSTAT) 0.4 MG SL tablet   Sublingual   Place 0.4 mg under the tongue every 5 (five) minutes as needed. For chest pain         . simvastatin (ZOCOR) 20 MG tablet   Oral   Take 20 mg by mouth every morning.            BP 140/69  Pulse 60  Temp(Src) 98.2 F (36.8 C) (Oral)  Resp 20  Ht 6' (1.829 m)  Wt 170 lb (77.111 kg)  BMI 23.05 kg/m2  SpO2 95%  Physical Exam  Nursing note and vitals reviewed. Constitutional: He is oriented to person, place, and time. He appears well-developed and well-nourished. No  distress.  HENT:  Head: Normocephalic and atraumatic.  Mouth/Throat: Oropharynx is clear and moist.  Eyes: Conjunctivae and EOM are normal. Pupils are equal, round, and reactive to light.  Neck: Normal range of motion. Neck supple.  Cardiovascular: Normal rate, regular rhythm and normal heart sounds.   No murmur heard. Pulmonary/Chest: Effort normal and breath sounds normal.  Abdominal: Soft. Bowel sounds are normal. He exhibits no mass. There is tenderness.  Surgical incisions for the laparoscopic trocar. Be healing well. No evidence of erythema or signs of infection no wheezing of blood. Patient with a large area or bruising to the right flank with some scattered bruising at the suprapubic area and into the right groin area. Bruising extends up to the level of the costophrenic angle and CVA area on the back. No bruising up along the chest or into the axillary area. Patient perceives fullness in this area no palpable mass noted. Nontender. Some mild tenderness around the surgical incisions.  Musculoskeletal: Normal range of motion. He exhibits no edema.  Neurological: He is alert and oriented to person, place, and time. No cranial nerve deficit. He exhibits normal muscle tone. Coordination normal.  Skin: Skin is warm. No rash noted. No erythema. No pallor.    ED Course  Procedures (including critical care time)  Labs Reviewed  CBC WITH DIFFERENTIAL - Abnormal; Notable for the following:    RBC 2.99 (*)    Hemoglobin 8.9 (*)    HCT 26.0 (*)    All other components within normal limits  COMPREHENSIVE METABOLIC PANEL - Abnormal; Notable for the following:    Glucose, Bld 125 (*)    Total Protein 5.9 (*)    Albumin 3.0 (*)    GFR calc non Af Amer 80 (*)    All other components within normal limits  PROTIME-INR   Ct Abdomen Pelvis W Contrast  06/29/2012  *RADIOLOGY REPORT*  Clinical Data: Hypertension, post cholecystectomy (06/26/2012) now with bruising about the right lateral aspect of  the abdomen.  CT ABDOMEN AND PELVIS WITH CONTRAST  Technique:  Multidetector CT imaging of the abdomen and pelvis was performed following the standard protocol during bolus administration of intravenous contrast.  Contrast: 50mL OMNIPAQUE IOHEXOL 300 MG/ML  SOLN, OMNIPAQUE IOHEXOL 300 MG/ML  SOLN  Comparison: CT abdomen pelvis - 06/23/2012  Findings:  Post cholecystectomy.  There is an approximately 4.0 x 2.3 x 2.5 cm complex air and fluid containing collection within the gallbladder fossa.  This finding is associated with a minimal amount of mesenteric stranding. No intra or extrahepatic biliary ductal dilatation. There are scattered foci of pneumoperitoneum within the abdomen (axial  image 32, 41, 53, 55 and 58, series 2).  There is high density material compatible with blood layering along the ventral aspect of the ventral mesentery (axial images 30 02/07, 53 and 59).  There is a small amount of hemoperitoneum with blood layering within the lower pelvis (image 81, series 2).  Normal hepatic contour. No discrete hepatic lesions.  There is symmetric enhancement and excretion of the bilateral kidneys.  No definite renal stones on the post contrast examination.  There is an unchanged approximately 1.8 cm left-sided renal cyst (image 47, series 2).  Sub centimeter hypoattenuating right-sided renal lesion (image 30, series seven) is too small to accurately characterize a favored to represent additional renal cyst.  No urinary obstruction.  There is a minimal amount of likely age related perinephric stranding.  Normal appearance of the bilateral adrenal glands, pancreas and spleen.  Colonic diverticulosis without evidence of diverticulitis. Moderate colonic stool burden without evidence of obstruction. Enteric contrast extends to the level of the distal small bowel. Normal appearance of the appendix. No pneumatosis or portal venous gas.  Scattered atherosclerotic plaque within a mildly ectatic the nominal aorta  measuring 2.1 cm in diameter (image 46, series 2). The major branch vessels of the abdominal aorta are patent on this non CT examination.  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  No retroperitoneal, mesenteric, pelvic or inguinal lymphadenopathy.  The prostate is enlarged.  Small bilateral mesenteric fat containing inguinal hernias.   Limited visualization of the lower thorax is negative for focal airspace opacity or pleural effusion.  Cardiomegaly.  Coronary artery calcifications.  No pericardial effusion.  No acute or aggressive osseous abnormalities.  There is extensive soft tissue swelling about the right flank with multiple scattered foci of subcutaneous emphysema about the mid aspect of the abdominal wall.  No drainable subcutaneous fluid collections.  IMPRESSION: 1.  Post cholecystectomy with an indeterminate approximately 4.0 x 2.3 x 2.5 cm complex air and fluid collection within the gallbladder fossa. Additionally, there is layering blood within the ventral abdominal mesentery and small amount of blood within the pelvic cul-de-sac.  2.  Extensive soft tissue swelling about the right flank with scattered foci of subcutaneous emphysema within the anterior wall the abdomen, also likely the sequela of recent cholecystectomy.  3.  Cardiomegaly. Coronary artery calcifications.   Original Report Authenticated By: Tacey Ruiz, MD    Results for orders placed during the hospital encounter of 06/29/12  PROTIME-INR      Result Value Range   Prothrombin Time 12.6  11.6 - 15.2 seconds   INR 0.95  0.00 - 1.49  CBC WITH DIFFERENTIAL      Result Value Range   WBC 6.2  4.0 - 10.5 K/uL   RBC 2.99 (*) 4.22 - 5.81 MIL/uL   Hemoglobin 8.9 (*) 13.0 - 17.0 g/dL   HCT 40.9 (*) 81.1 - 91.4 %   MCV 87.0  78.0 - 100.0 fL   MCH 29.8  26.0 - 34.0 pg   MCHC 34.2  30.0 - 36.0 g/dL   RDW 78.2  95.6 - 21.3 %   Platelets 183  150 - 400 K/uL   Neutrophils Relative 68  43 - 77 %   Neutro Abs 4.2   1.7 - 7.7 K/uL   Lymphocytes Relative 18  12 - 46 %   Lymphs Abs 1.1  0.7 - 4.0 K/uL   Monocytes Relative 9  3 - 12 %   Monocytes Absolute 0.5  0.1 - 1.0 K/uL   Eosinophils Relative 5  0 - 5 %   Eosinophils Absolute 0.3  0.0 - 0.7 K/uL   Basophils Relative 1  0 - 1 %   Basophils Absolute 0.0  0.0 - 0.1 K/uL  COMPREHENSIVE METABOLIC PANEL      Result Value Range   Sodium 142  135 - 145 mEq/L   Potassium 3.9  3.5 - 5.1 mEq/L   Chloride 105  96 - 112 mEq/L   CO2 30  19 - 32 mEq/L   Glucose, Bld 125 (*) 70 - 99 mg/dL   BUN 7  6 - 23 mg/dL   Creatinine, Ser 4.54  0.50 - 1.35 mg/dL   Calcium 8.9  8.4 - 09.8 mg/dL   Total Protein 5.9 (*) 6.0 - 8.3 g/dL   Albumin 3.0 (*) 3.5 - 5.2 g/dL   AST 25  0 - 37 U/L   ALT 38  0 - 53 U/L   Alkaline Phosphatase 52  39 - 117 U/L   Total Bilirubin 0.4  0.3 - 1.2 mg/dL   GFR calc non Af Amer 80 (*) >90 mL/min   GFR calc Af Amer >90  >90 mL/min     1. Hematoma       MDM   CT findings and lab findings discussed with the patient's surgeon Dr. Leticia Penna. The intra-abdominal findings seen to be consistent with be expected with the surgery. Or testicular noted that there have been a lot of inflammation around the gallbladder which therefore led to some bleeding from the gallbladder fossa. He put some Surgicel in there to help control the bleeding as part was being seen on the CAT scan. The heavy bruising on the right flank right lower quadrant area is most likely due to subcutaneous bleeding. CT is not able to see a fluid collection of blood or here or distinct hematoma. Patient is asymptomatic with good vital signs no lightheadedness no dizziness does not feel like she's going to pass out. There is no history of any orthostatic blood pressures recently and patient's been fine here in the ED. Doctors are follow patient up in the office next week they're to call on Tuesday for the time. Patient will return for any newer worse symptoms that include going to  pass out lightheaded dizziness shortness of breath or chest pain. Dr. Leticia Penna also felt that the change and hemoglobin hematocrit was consistent with the blood loss seen during surgery.        Shelda Jakes, MD 06/29/12 2000  Shelda Jakes, MD 06/29/12 2008

## 2012-06-29 NOTE — ED Notes (Signed)
Patient walk to restroom with no problems

## 2014-05-20 DIAGNOSIS — Z85820 Personal history of malignant melanoma of skin: Secondary | ICD-10-CM | POA: Diagnosis not present

## 2014-05-20 DIAGNOSIS — Z08 Encounter for follow-up examination after completed treatment for malignant neoplasm: Secondary | ICD-10-CM | POA: Diagnosis not present

## 2014-05-20 DIAGNOSIS — L570 Actinic keratosis: Secondary | ICD-10-CM | POA: Diagnosis not present

## 2014-05-20 DIAGNOSIS — L905 Scar conditions and fibrosis of skin: Secondary | ICD-10-CM | POA: Diagnosis not present

## 2014-05-20 DIAGNOSIS — X32XXXD Exposure to sunlight, subsequent encounter: Secondary | ICD-10-CM | POA: Diagnosis not present

## 2014-05-28 DIAGNOSIS — I10 Essential (primary) hypertension: Secondary | ICD-10-CM | POA: Diagnosis not present

## 2014-05-28 DIAGNOSIS — I2510 Atherosclerotic heart disease of native coronary artery without angina pectoris: Secondary | ICD-10-CM | POA: Diagnosis not present

## 2014-05-28 DIAGNOSIS — E119 Type 2 diabetes mellitus without complications: Secondary | ICD-10-CM | POA: Diagnosis not present

## 2014-05-28 DIAGNOSIS — Z125 Encounter for screening for malignant neoplasm of prostate: Secondary | ICD-10-CM | POA: Diagnosis not present

## 2014-05-28 DIAGNOSIS — E785 Hyperlipidemia, unspecified: Secondary | ICD-10-CM | POA: Diagnosis not present

## 2014-06-03 DIAGNOSIS — E785 Hyperlipidemia, unspecified: Secondary | ICD-10-CM | POA: Diagnosis not present

## 2014-06-03 DIAGNOSIS — E119 Type 2 diabetes mellitus without complications: Secondary | ICD-10-CM | POA: Diagnosis not present

## 2014-06-03 DIAGNOSIS — I2510 Atherosclerotic heart disease of native coronary artery without angina pectoris: Secondary | ICD-10-CM | POA: Diagnosis not present

## 2014-06-12 DIAGNOSIS — Z961 Presence of intraocular lens: Secondary | ICD-10-CM | POA: Diagnosis not present

## 2014-06-12 DIAGNOSIS — H02831 Dermatochalasis of right upper eyelid: Secondary | ICD-10-CM | POA: Diagnosis not present

## 2014-06-12 DIAGNOSIS — E119 Type 2 diabetes mellitus without complications: Secondary | ICD-10-CM | POA: Diagnosis not present

## 2014-06-12 DIAGNOSIS — D3131 Benign neoplasm of right choroid: Secondary | ICD-10-CM | POA: Diagnosis not present

## 2014-06-12 DIAGNOSIS — H02834 Dermatochalasis of left upper eyelid: Secondary | ICD-10-CM | POA: Diagnosis not present

## 2014-09-26 DIAGNOSIS — E119 Type 2 diabetes mellitus without complications: Secondary | ICD-10-CM | POA: Diagnosis not present

## 2014-10-03 DIAGNOSIS — Z23 Encounter for immunization: Secondary | ICD-10-CM | POA: Diagnosis not present

## 2014-10-03 DIAGNOSIS — I10 Essential (primary) hypertension: Secondary | ICD-10-CM | POA: Diagnosis not present

## 2014-10-03 DIAGNOSIS — E119 Type 2 diabetes mellitus without complications: Secondary | ICD-10-CM | POA: Diagnosis not present

## 2014-10-03 DIAGNOSIS — G4762 Sleep related leg cramps: Secondary | ICD-10-CM | POA: Diagnosis not present

## 2014-10-28 DIAGNOSIS — Z08 Encounter for follow-up examination after completed treatment for malignant neoplasm: Secondary | ICD-10-CM | POA: Diagnosis not present

## 2014-10-28 DIAGNOSIS — L570 Actinic keratosis: Secondary | ICD-10-CM | POA: Diagnosis not present

## 2014-10-28 DIAGNOSIS — Z1283 Encounter for screening for malignant neoplasm of skin: Secondary | ICD-10-CM | POA: Diagnosis not present

## 2014-10-28 DIAGNOSIS — Z85820 Personal history of malignant melanoma of skin: Secondary | ICD-10-CM | POA: Diagnosis not present

## 2014-10-28 DIAGNOSIS — X32XXXD Exposure to sunlight, subsequent encounter: Secondary | ICD-10-CM | POA: Diagnosis not present

## 2015-01-27 DIAGNOSIS — I2510 Atherosclerotic heart disease of native coronary artery without angina pectoris: Secondary | ICD-10-CM | POA: Diagnosis not present

## 2015-01-27 DIAGNOSIS — E119 Type 2 diabetes mellitus without complications: Secondary | ICD-10-CM | POA: Diagnosis not present

## 2015-01-27 DIAGNOSIS — E785 Hyperlipidemia, unspecified: Secondary | ICD-10-CM | POA: Diagnosis not present

## 2015-01-27 DIAGNOSIS — I10 Essential (primary) hypertension: Secondary | ICD-10-CM | POA: Diagnosis not present

## 2015-02-03 DIAGNOSIS — Z23 Encounter for immunization: Secondary | ICD-10-CM | POA: Diagnosis not present

## 2015-02-03 DIAGNOSIS — G4762 Sleep related leg cramps: Secondary | ICD-10-CM | POA: Diagnosis not present

## 2015-02-03 DIAGNOSIS — Z6823 Body mass index (BMI) 23.0-23.9, adult: Secondary | ICD-10-CM | POA: Diagnosis not present

## 2015-02-03 DIAGNOSIS — I2510 Atherosclerotic heart disease of native coronary artery without angina pectoris: Secondary | ICD-10-CM | POA: Diagnosis not present

## 2015-02-03 DIAGNOSIS — E119 Type 2 diabetes mellitus without complications: Secondary | ICD-10-CM | POA: Diagnosis not present

## 2015-03-20 DIAGNOSIS — Z85820 Personal history of malignant melanoma of skin: Secondary | ICD-10-CM | POA: Diagnosis not present

## 2015-03-20 DIAGNOSIS — L570 Actinic keratosis: Secondary | ICD-10-CM | POA: Diagnosis not present

## 2015-03-20 DIAGNOSIS — Z1283 Encounter for screening for malignant neoplasm of skin: Secondary | ICD-10-CM | POA: Diagnosis not present

## 2015-03-20 DIAGNOSIS — X32XXXD Exposure to sunlight, subsequent encounter: Secondary | ICD-10-CM | POA: Diagnosis not present

## 2015-03-20 DIAGNOSIS — Z08 Encounter for follow-up examination after completed treatment for malignant neoplasm: Secondary | ICD-10-CM | POA: Diagnosis not present

## 2015-06-02 DIAGNOSIS — E785 Hyperlipidemia, unspecified: Secondary | ICD-10-CM | POA: Diagnosis not present

## 2015-06-02 DIAGNOSIS — I2510 Atherosclerotic heart disease of native coronary artery without angina pectoris: Secondary | ICD-10-CM | POA: Diagnosis not present

## 2015-06-02 DIAGNOSIS — E119 Type 2 diabetes mellitus without complications: Secondary | ICD-10-CM | POA: Diagnosis not present

## 2015-06-02 DIAGNOSIS — I10 Essential (primary) hypertension: Secondary | ICD-10-CM | POA: Diagnosis not present

## 2015-06-02 DIAGNOSIS — Z79899 Other long term (current) drug therapy: Secondary | ICD-10-CM | POA: Diagnosis not present

## 2015-06-09 DIAGNOSIS — I10 Essential (primary) hypertension: Secondary | ICD-10-CM | POA: Diagnosis not present

## 2015-06-09 DIAGNOSIS — Z6823 Body mass index (BMI) 23.0-23.9, adult: Secondary | ICD-10-CM | POA: Diagnosis not present

## 2015-06-09 DIAGNOSIS — E119 Type 2 diabetes mellitus without complications: Secondary | ICD-10-CM | POA: Diagnosis not present

## 2015-06-09 DIAGNOSIS — E785 Hyperlipidemia, unspecified: Secondary | ICD-10-CM | POA: Diagnosis not present

## 2015-06-19 DIAGNOSIS — E119 Type 2 diabetes mellitus without complications: Secondary | ICD-10-CM | POA: Diagnosis not present

## 2015-06-19 DIAGNOSIS — H02831 Dermatochalasis of right upper eyelid: Secondary | ICD-10-CM | POA: Diagnosis not present

## 2015-06-19 DIAGNOSIS — H02834 Dermatochalasis of left upper eyelid: Secondary | ICD-10-CM | POA: Diagnosis not present

## 2015-06-19 DIAGNOSIS — Z961 Presence of intraocular lens: Secondary | ICD-10-CM | POA: Diagnosis not present

## 2015-06-19 DIAGNOSIS — D3131 Benign neoplasm of right choroid: Secondary | ICD-10-CM | POA: Diagnosis not present

## 2015-06-30 DIAGNOSIS — C44629 Squamous cell carcinoma of skin of left upper limb, including shoulder: Secondary | ICD-10-CM | POA: Diagnosis not present

## 2015-06-30 DIAGNOSIS — X32XXXD Exposure to sunlight, subsequent encounter: Secondary | ICD-10-CM | POA: Diagnosis not present

## 2015-06-30 DIAGNOSIS — D485 Neoplasm of uncertain behavior of skin: Secondary | ICD-10-CM | POA: Diagnosis not present

## 2015-06-30 DIAGNOSIS — Z85820 Personal history of malignant melanoma of skin: Secondary | ICD-10-CM | POA: Diagnosis not present

## 2015-06-30 DIAGNOSIS — D225 Melanocytic nevi of trunk: Secondary | ICD-10-CM | POA: Diagnosis not present

## 2015-06-30 DIAGNOSIS — L570 Actinic keratosis: Secondary | ICD-10-CM | POA: Diagnosis not present

## 2015-06-30 DIAGNOSIS — Z1283 Encounter for screening for malignant neoplasm of skin: Secondary | ICD-10-CM | POA: Diagnosis not present

## 2015-06-30 DIAGNOSIS — Z08 Encounter for follow-up examination after completed treatment for malignant neoplasm: Secondary | ICD-10-CM | POA: Diagnosis not present

## 2015-09-03 DIAGNOSIS — L570 Actinic keratosis: Secondary | ICD-10-CM | POA: Diagnosis not present

## 2015-09-03 DIAGNOSIS — Z08 Encounter for follow-up examination after completed treatment for malignant neoplasm: Secondary | ICD-10-CM | POA: Diagnosis not present

## 2015-09-03 DIAGNOSIS — X32XXXD Exposure to sunlight, subsequent encounter: Secondary | ICD-10-CM | POA: Diagnosis not present

## 2015-09-03 DIAGNOSIS — Z1283 Encounter for screening for malignant neoplasm of skin: Secondary | ICD-10-CM | POA: Diagnosis not present

## 2015-09-03 DIAGNOSIS — Z85820 Personal history of malignant melanoma of skin: Secondary | ICD-10-CM | POA: Diagnosis not present

## 2015-09-03 DIAGNOSIS — Z85828 Personal history of other malignant neoplasm of skin: Secondary | ICD-10-CM | POA: Diagnosis not present

## 2015-10-03 DIAGNOSIS — E119 Type 2 diabetes mellitus without complications: Secondary | ICD-10-CM | POA: Diagnosis not present

## 2015-10-10 DIAGNOSIS — I10 Essential (primary) hypertension: Secondary | ICD-10-CM | POA: Diagnosis not present

## 2015-10-10 DIAGNOSIS — Z6822 Body mass index (BMI) 22.0-22.9, adult: Secondary | ICD-10-CM | POA: Diagnosis not present

## 2015-10-10 DIAGNOSIS — E1159 Type 2 diabetes mellitus with other circulatory complications: Secondary | ICD-10-CM | POA: Diagnosis not present

## 2015-10-10 DIAGNOSIS — Z23 Encounter for immunization: Secondary | ICD-10-CM | POA: Diagnosis not present

## 2015-10-10 DIAGNOSIS — K219 Gastro-esophageal reflux disease without esophagitis: Secondary | ICD-10-CM | POA: Diagnosis not present

## 2016-01-26 DIAGNOSIS — L570 Actinic keratosis: Secondary | ICD-10-CM | POA: Diagnosis not present

## 2016-01-26 DIAGNOSIS — L821 Other seborrheic keratosis: Secondary | ICD-10-CM | POA: Diagnosis not present

## 2016-01-26 DIAGNOSIS — Z85820 Personal history of malignant melanoma of skin: Secondary | ICD-10-CM | POA: Diagnosis not present

## 2016-01-26 DIAGNOSIS — X32XXXD Exposure to sunlight, subsequent encounter: Secondary | ICD-10-CM | POA: Diagnosis not present

## 2016-01-26 DIAGNOSIS — Z08 Encounter for follow-up examination after completed treatment for malignant neoplasm: Secondary | ICD-10-CM | POA: Diagnosis not present

## 2016-01-26 DIAGNOSIS — Z1283 Encounter for screening for malignant neoplasm of skin: Secondary | ICD-10-CM | POA: Diagnosis not present

## 2016-02-04 DIAGNOSIS — E119 Type 2 diabetes mellitus without complications: Secondary | ICD-10-CM | POA: Diagnosis not present

## 2016-02-12 DIAGNOSIS — E1159 Type 2 diabetes mellitus with other circulatory complications: Secondary | ICD-10-CM | POA: Diagnosis not present

## 2016-02-12 DIAGNOSIS — M542 Cervicalgia: Secondary | ICD-10-CM | POA: Diagnosis not present

## 2016-02-12 DIAGNOSIS — I10 Essential (primary) hypertension: Secondary | ICD-10-CM | POA: Diagnosis not present

## 2016-02-12 DIAGNOSIS — Z23 Encounter for immunization: Secondary | ICD-10-CM | POA: Diagnosis not present

## 2016-05-13 DIAGNOSIS — X32XXXD Exposure to sunlight, subsequent encounter: Secondary | ICD-10-CM | POA: Diagnosis not present

## 2016-05-13 DIAGNOSIS — L570 Actinic keratosis: Secondary | ICD-10-CM | POA: Diagnosis not present

## 2016-06-14 DIAGNOSIS — Z79899 Other long term (current) drug therapy: Secondary | ICD-10-CM | POA: Diagnosis not present

## 2016-06-14 DIAGNOSIS — I10 Essential (primary) hypertension: Secondary | ICD-10-CM | POA: Diagnosis not present

## 2016-06-14 DIAGNOSIS — I2510 Atherosclerotic heart disease of native coronary artery without angina pectoris: Secondary | ICD-10-CM | POA: Diagnosis not present

## 2016-06-14 DIAGNOSIS — E785 Hyperlipidemia, unspecified: Secondary | ICD-10-CM | POA: Diagnosis not present

## 2016-06-14 DIAGNOSIS — E119 Type 2 diabetes mellitus without complications: Secondary | ICD-10-CM | POA: Diagnosis not present

## 2016-06-22 DIAGNOSIS — E1149 Type 2 diabetes mellitus with other diabetic neurological complication: Secondary | ICD-10-CM | POA: Diagnosis not present

## 2016-06-22 DIAGNOSIS — I10 Essential (primary) hypertension: Secondary | ICD-10-CM | POA: Diagnosis not present

## 2016-06-22 DIAGNOSIS — I2510 Atherosclerotic heart disease of native coronary artery without angina pectoris: Secondary | ICD-10-CM | POA: Diagnosis not present

## 2016-06-22 DIAGNOSIS — Z6823 Body mass index (BMI) 23.0-23.9, adult: Secondary | ICD-10-CM | POA: Diagnosis not present

## 2016-07-13 DIAGNOSIS — E119 Type 2 diabetes mellitus without complications: Secondary | ICD-10-CM | POA: Diagnosis not present

## 2016-07-13 DIAGNOSIS — Z961 Presence of intraocular lens: Secondary | ICD-10-CM | POA: Diagnosis not present

## 2016-07-13 DIAGNOSIS — D3131 Benign neoplasm of right choroid: Secondary | ICD-10-CM | POA: Diagnosis not present

## 2016-07-26 DIAGNOSIS — X32XXXD Exposure to sunlight, subsequent encounter: Secondary | ICD-10-CM | POA: Diagnosis not present

## 2016-07-26 DIAGNOSIS — L570 Actinic keratosis: Secondary | ICD-10-CM | POA: Diagnosis not present

## 2016-07-26 DIAGNOSIS — Z85820 Personal history of malignant melanoma of skin: Secondary | ICD-10-CM | POA: Diagnosis not present

## 2016-07-26 DIAGNOSIS — Z08 Encounter for follow-up examination after completed treatment for malignant neoplasm: Secondary | ICD-10-CM | POA: Diagnosis not present

## 2016-10-18 DIAGNOSIS — E119 Type 2 diabetes mellitus without complications: Secondary | ICD-10-CM | POA: Diagnosis not present

## 2016-10-29 DIAGNOSIS — E1149 Type 2 diabetes mellitus with other diabetic neurological complication: Secondary | ICD-10-CM | POA: Diagnosis not present

## 2016-10-29 DIAGNOSIS — I2510 Atherosclerotic heart disease of native coronary artery without angina pectoris: Secondary | ICD-10-CM | POA: Diagnosis not present

## 2016-12-01 DIAGNOSIS — D034 Melanoma in situ of scalp and neck: Secondary | ICD-10-CM | POA: Diagnosis not present

## 2016-12-01 DIAGNOSIS — X32XXXD Exposure to sunlight, subsequent encounter: Secondary | ICD-10-CM | POA: Diagnosis not present

## 2016-12-01 DIAGNOSIS — L570 Actinic keratosis: Secondary | ICD-10-CM | POA: Diagnosis not present

## 2016-12-30 DIAGNOSIS — L570 Actinic keratosis: Secondary | ICD-10-CM | POA: Diagnosis not present

## 2016-12-30 DIAGNOSIS — X32XXXD Exposure to sunlight, subsequent encounter: Secondary | ICD-10-CM | POA: Diagnosis not present

## 2016-12-30 DIAGNOSIS — D487 Neoplasm of uncertain behavior of other specified sites: Secondary | ICD-10-CM | POA: Diagnosis not present

## 2016-12-30 DIAGNOSIS — D034 Melanoma in situ of scalp and neck: Secondary | ICD-10-CM | POA: Diagnosis not present

## 2016-12-30 DIAGNOSIS — C44629 Squamous cell carcinoma of skin of left upper limb, including shoulder: Secondary | ICD-10-CM | POA: Diagnosis not present

## 2017-01-24 DIAGNOSIS — C44629 Squamous cell carcinoma of skin of left upper limb, including shoulder: Secondary | ICD-10-CM | POA: Diagnosis not present

## 2017-02-22 DIAGNOSIS — E119 Type 2 diabetes mellitus without complications: Secondary | ICD-10-CM | POA: Diagnosis not present

## 2017-03-01 DIAGNOSIS — I10 Essential (primary) hypertension: Secondary | ICD-10-CM | POA: Diagnosis not present

## 2017-03-01 DIAGNOSIS — Z23 Encounter for immunization: Secondary | ICD-10-CM | POA: Diagnosis not present

## 2017-03-01 DIAGNOSIS — I2510 Atherosclerotic heart disease of native coronary artery without angina pectoris: Secondary | ICD-10-CM | POA: Diagnosis not present

## 2017-03-01 DIAGNOSIS — E1149 Type 2 diabetes mellitus with other diabetic neurological complication: Secondary | ICD-10-CM | POA: Diagnosis not present

## 2017-03-03 DIAGNOSIS — L570 Actinic keratosis: Secondary | ICD-10-CM | POA: Diagnosis not present

## 2017-03-03 DIAGNOSIS — L928 Other granulomatous disorders of the skin and subcutaneous tissue: Secondary | ICD-10-CM | POA: Diagnosis not present

## 2017-03-03 DIAGNOSIS — Z85828 Personal history of other malignant neoplasm of skin: Secondary | ICD-10-CM | POA: Diagnosis not present

## 2017-03-03 DIAGNOSIS — Z85820 Personal history of malignant melanoma of skin: Secondary | ICD-10-CM | POA: Diagnosis not present

## 2017-03-03 DIAGNOSIS — X32XXXD Exposure to sunlight, subsequent encounter: Secondary | ICD-10-CM | POA: Diagnosis not present

## 2017-03-03 DIAGNOSIS — Z08 Encounter for follow-up examination after completed treatment for malignant neoplasm: Secondary | ICD-10-CM | POA: Diagnosis not present

## 2017-04-29 DIAGNOSIS — M545 Low back pain: Secondary | ICD-10-CM | POA: Diagnosis not present

## 2017-04-29 DIAGNOSIS — M5431 Sciatica, right side: Secondary | ICD-10-CM | POA: Diagnosis not present

## 2017-05-02 DIAGNOSIS — Z85820 Personal history of malignant melanoma of skin: Secondary | ICD-10-CM | POA: Diagnosis not present

## 2017-05-02 DIAGNOSIS — Z08 Encounter for follow-up examination after completed treatment for malignant neoplasm: Secondary | ICD-10-CM | POA: Diagnosis not present

## 2017-05-02 DIAGNOSIS — Z1283 Encounter for screening for malignant neoplasm of skin: Secondary | ICD-10-CM | POA: Diagnosis not present

## 2017-05-02 DIAGNOSIS — X32XXXD Exposure to sunlight, subsequent encounter: Secondary | ICD-10-CM | POA: Diagnosis not present

## 2017-05-02 DIAGNOSIS — L570 Actinic keratosis: Secondary | ICD-10-CM | POA: Diagnosis not present

## 2017-06-22 DIAGNOSIS — E785 Hyperlipidemia, unspecified: Secondary | ICD-10-CM | POA: Diagnosis not present

## 2017-06-22 DIAGNOSIS — E1140 Type 2 diabetes mellitus with diabetic neuropathy, unspecified: Secondary | ICD-10-CM | POA: Diagnosis not present

## 2017-06-22 DIAGNOSIS — I2510 Atherosclerotic heart disease of native coronary artery without angina pectoris: Secondary | ICD-10-CM | POA: Diagnosis not present

## 2017-06-22 DIAGNOSIS — I10 Essential (primary) hypertension: Secondary | ICD-10-CM | POA: Diagnosis not present

## 2017-06-29 DIAGNOSIS — I2510 Atherosclerotic heart disease of native coronary artery without angina pectoris: Secondary | ICD-10-CM | POA: Diagnosis not present

## 2017-06-29 DIAGNOSIS — E1149 Type 2 diabetes mellitus with other diabetic neurological complication: Secondary | ICD-10-CM | POA: Diagnosis not present

## 2017-06-29 DIAGNOSIS — Z6821 Body mass index (BMI) 21.0-21.9, adult: Secondary | ICD-10-CM | POA: Diagnosis not present

## 2017-06-29 DIAGNOSIS — I10 Essential (primary) hypertension: Secondary | ICD-10-CM | POA: Diagnosis not present

## 2017-07-14 DIAGNOSIS — D3131 Benign neoplasm of right choroid: Secondary | ICD-10-CM | POA: Diagnosis not present

## 2017-07-14 DIAGNOSIS — E119 Type 2 diabetes mellitus without complications: Secondary | ICD-10-CM | POA: Diagnosis not present

## 2017-07-14 DIAGNOSIS — H02834 Dermatochalasis of left upper eyelid: Secondary | ICD-10-CM | POA: Diagnosis not present

## 2017-07-14 DIAGNOSIS — H02831 Dermatochalasis of right upper eyelid: Secondary | ICD-10-CM | POA: Diagnosis not present

## 2017-07-14 DIAGNOSIS — Z961 Presence of intraocular lens: Secondary | ICD-10-CM | POA: Diagnosis not present

## 2017-08-01 DIAGNOSIS — X32XXXD Exposure to sunlight, subsequent encounter: Secondary | ICD-10-CM | POA: Diagnosis not present

## 2017-08-01 DIAGNOSIS — C4441 Basal cell carcinoma of skin of scalp and neck: Secondary | ICD-10-CM | POA: Diagnosis not present

## 2017-08-01 DIAGNOSIS — L570 Actinic keratosis: Secondary | ICD-10-CM | POA: Diagnosis not present

## 2017-10-26 DIAGNOSIS — E1140 Type 2 diabetes mellitus with diabetic neuropathy, unspecified: Secondary | ICD-10-CM | POA: Diagnosis not present

## 2017-11-02 DIAGNOSIS — I10 Essential (primary) hypertension: Secondary | ICD-10-CM | POA: Diagnosis not present

## 2017-11-02 DIAGNOSIS — E1149 Type 2 diabetes mellitus with other diabetic neurological complication: Secondary | ICD-10-CM | POA: Diagnosis not present

## 2017-11-02 DIAGNOSIS — I2510 Atherosclerotic heart disease of native coronary artery without angina pectoris: Secondary | ICD-10-CM | POA: Diagnosis not present

## 2017-11-30 DIAGNOSIS — Z08 Encounter for follow-up examination after completed treatment for malignant neoplasm: Secondary | ICD-10-CM | POA: Diagnosis not present

## 2017-11-30 DIAGNOSIS — Z85820 Personal history of malignant melanoma of skin: Secondary | ICD-10-CM | POA: Diagnosis not present

## 2017-11-30 DIAGNOSIS — D225 Melanocytic nevi of trunk: Secondary | ICD-10-CM | POA: Diagnosis not present

## 2017-11-30 DIAGNOSIS — X32XXXD Exposure to sunlight, subsequent encounter: Secondary | ICD-10-CM | POA: Diagnosis not present

## 2017-11-30 DIAGNOSIS — L570 Actinic keratosis: Secondary | ICD-10-CM | POA: Diagnosis not present

## 2018-01-17 DIAGNOSIS — S30870A Other superficial bite of lower back and pelvis, initial encounter: Secondary | ICD-10-CM | POA: Diagnosis not present

## 2018-01-17 DIAGNOSIS — W57XXXA Bitten or stung by nonvenomous insect and other nonvenomous arthropods, initial encounter: Secondary | ICD-10-CM | POA: Diagnosis not present

## 2018-01-17 DIAGNOSIS — S3091XS Unspecified superficial injury of lower back and pelvis, sequela: Secondary | ICD-10-CM | POA: Diagnosis not present

## 2018-03-08 DIAGNOSIS — E119 Type 2 diabetes mellitus without complications: Secondary | ICD-10-CM | POA: Diagnosis not present

## 2018-03-17 DIAGNOSIS — I2510 Atherosclerotic heart disease of native coronary artery without angina pectoris: Secondary | ICD-10-CM | POA: Diagnosis not present

## 2018-03-17 DIAGNOSIS — Z23 Encounter for immunization: Secondary | ICD-10-CM | POA: Diagnosis not present

## 2018-03-17 DIAGNOSIS — E1140 Type 2 diabetes mellitus with diabetic neuropathy, unspecified: Secondary | ICD-10-CM | POA: Diagnosis not present

## 2018-03-27 DIAGNOSIS — Z08 Encounter for follow-up examination after completed treatment for malignant neoplasm: Secondary | ICD-10-CM | POA: Diagnosis not present

## 2018-03-27 DIAGNOSIS — D225 Melanocytic nevi of trunk: Secondary | ICD-10-CM | POA: Diagnosis not present

## 2018-03-27 DIAGNOSIS — L570 Actinic keratosis: Secondary | ICD-10-CM | POA: Diagnosis not present

## 2018-03-27 DIAGNOSIS — X32XXXD Exposure to sunlight, subsequent encounter: Secondary | ICD-10-CM | POA: Diagnosis not present

## 2018-03-27 DIAGNOSIS — Z85820 Personal history of malignant melanoma of skin: Secondary | ICD-10-CM | POA: Diagnosis not present

## 2018-03-27 DIAGNOSIS — C44319 Basal cell carcinoma of skin of other parts of face: Secondary | ICD-10-CM | POA: Diagnosis not present

## 2018-07-13 DIAGNOSIS — H02831 Dermatochalasis of right upper eyelid: Secondary | ICD-10-CM | POA: Diagnosis not present

## 2018-07-13 DIAGNOSIS — H43813 Vitreous degeneration, bilateral: Secondary | ICD-10-CM | POA: Diagnosis not present

## 2018-07-13 DIAGNOSIS — H02834 Dermatochalasis of left upper eyelid: Secondary | ICD-10-CM | POA: Diagnosis not present

## 2018-07-13 DIAGNOSIS — D3131 Benign neoplasm of right choroid: Secondary | ICD-10-CM | POA: Diagnosis not present

## 2018-07-13 DIAGNOSIS — E119 Type 2 diabetes mellitus without complications: Secondary | ICD-10-CM | POA: Diagnosis not present

## 2018-07-13 DIAGNOSIS — Z961 Presence of intraocular lens: Secondary | ICD-10-CM | POA: Diagnosis not present

## 2018-07-25 DIAGNOSIS — R6884 Jaw pain: Secondary | ICD-10-CM | POA: Diagnosis not present

## 2018-07-26 DIAGNOSIS — E1140 Type 2 diabetes mellitus with diabetic neuropathy, unspecified: Secondary | ICD-10-CM | POA: Diagnosis not present

## 2018-07-26 DIAGNOSIS — I2510 Atherosclerotic heart disease of native coronary artery without angina pectoris: Secondary | ICD-10-CM | POA: Diagnosis not present

## 2018-07-26 DIAGNOSIS — Z79899 Other long term (current) drug therapy: Secondary | ICD-10-CM | POA: Diagnosis not present

## 2018-07-26 DIAGNOSIS — E785 Hyperlipidemia, unspecified: Secondary | ICD-10-CM | POA: Diagnosis not present

## 2018-07-26 DIAGNOSIS — I10 Essential (primary) hypertension: Secondary | ICD-10-CM | POA: Diagnosis not present

## 2018-08-02 DIAGNOSIS — I10 Essential (primary) hypertension: Secondary | ICD-10-CM | POA: Diagnosis not present

## 2018-08-02 DIAGNOSIS — E785 Hyperlipidemia, unspecified: Secondary | ICD-10-CM | POA: Diagnosis not present

## 2018-08-02 DIAGNOSIS — E1140 Type 2 diabetes mellitus with diabetic neuropathy, unspecified: Secondary | ICD-10-CM | POA: Diagnosis not present

## 2018-08-30 DIAGNOSIS — L570 Actinic keratosis: Secondary | ICD-10-CM | POA: Diagnosis not present

## 2018-08-30 DIAGNOSIS — X32XXXD Exposure to sunlight, subsequent encounter: Secondary | ICD-10-CM | POA: Diagnosis not present

## 2018-08-30 DIAGNOSIS — Z08 Encounter for follow-up examination after completed treatment for malignant neoplasm: Secondary | ICD-10-CM | POA: Diagnosis not present

## 2018-08-30 DIAGNOSIS — L820 Inflamed seborrheic keratosis: Secondary | ICD-10-CM | POA: Diagnosis not present

## 2018-08-30 DIAGNOSIS — Z85828 Personal history of other malignant neoplasm of skin: Secondary | ICD-10-CM | POA: Diagnosis not present

## 2018-09-18 DIAGNOSIS — N481 Balanitis: Secondary | ICD-10-CM | POA: Diagnosis not present

## 2018-11-20 DIAGNOSIS — D0421 Carcinoma in situ of skin of right ear and external auricular canal: Secondary | ICD-10-CM | POA: Diagnosis not present

## 2018-11-20 DIAGNOSIS — X32XXXD Exposure to sunlight, subsequent encounter: Secondary | ICD-10-CM | POA: Diagnosis not present

## 2018-11-20 DIAGNOSIS — C44311 Basal cell carcinoma of skin of nose: Secondary | ICD-10-CM | POA: Diagnosis not present

## 2018-11-20 DIAGNOSIS — L570 Actinic keratosis: Secondary | ICD-10-CM | POA: Diagnosis not present

## 2018-11-28 DIAGNOSIS — E1140 Type 2 diabetes mellitus with diabetic neuropathy, unspecified: Secondary | ICD-10-CM | POA: Diagnosis not present

## 2018-12-05 DIAGNOSIS — J189 Pneumonia, unspecified organism: Secondary | ICD-10-CM | POA: Diagnosis not present

## 2018-12-05 DIAGNOSIS — E1140 Type 2 diabetes mellitus with diabetic neuropathy, unspecified: Secondary | ICD-10-CM | POA: Diagnosis not present

## 2019-02-09 DIAGNOSIS — Z23 Encounter for immunization: Secondary | ICD-10-CM | POA: Diagnosis not present

## 2019-02-26 DIAGNOSIS — Z85820 Personal history of malignant melanoma of skin: Secondary | ICD-10-CM | POA: Diagnosis not present

## 2019-02-26 DIAGNOSIS — L570 Actinic keratosis: Secondary | ICD-10-CM | POA: Diagnosis not present

## 2019-02-26 DIAGNOSIS — Z85828 Personal history of other malignant neoplasm of skin: Secondary | ICD-10-CM | POA: Diagnosis not present

## 2019-02-26 DIAGNOSIS — Z08 Encounter for follow-up examination after completed treatment for malignant neoplasm: Secondary | ICD-10-CM | POA: Diagnosis not present

## 2019-02-26 DIAGNOSIS — X32XXXD Exposure to sunlight, subsequent encounter: Secondary | ICD-10-CM | POA: Diagnosis not present

## 2019-03-30 DIAGNOSIS — E1140 Type 2 diabetes mellitus with diabetic neuropathy, unspecified: Secondary | ICD-10-CM | POA: Diagnosis not present

## 2019-04-06 DIAGNOSIS — E1140 Type 2 diabetes mellitus with diabetic neuropathy, unspecified: Secondary | ICD-10-CM | POA: Diagnosis not present

## 2019-04-06 DIAGNOSIS — I2510 Atherosclerotic heart disease of native coronary artery without angina pectoris: Secondary | ICD-10-CM | POA: Diagnosis not present

## 2019-07-09 ENCOUNTER — Other Ambulatory Visit (INDEPENDENT_AMBULATORY_CARE_PROVIDER_SITE_OTHER): Payer: Self-pay | Admitting: *Deleted

## 2019-07-09 ENCOUNTER — Telehealth (INDEPENDENT_AMBULATORY_CARE_PROVIDER_SITE_OTHER): Payer: Self-pay | Admitting: *Deleted

## 2019-07-09 DIAGNOSIS — R1310 Dysphagia, unspecified: Secondary | ICD-10-CM | POA: Diagnosis not present

## 2019-07-09 DIAGNOSIS — R1319 Other dysphagia: Secondary | ICD-10-CM

## 2019-07-09 NOTE — Telephone Encounter (Signed)
PCP: [email or web address]             Procedure: egd/ed [id number]  Reason/Indication: dysphagia  Has patient had this procedure before? no  If yes, when, by whom & where?   Is there a family history of colon cancer?   If yes, who & what age diagnosed?   Is patient diabetic? yes  Does patient have prosthetic heart valve or mechanical valve? no  Does patient have a pacemaker/defibrillator? no  Has patient ever had endocarditis/atrial fibrillation? no  Is patient on oxygen? no  Has patient had joint replacement with last 12 months? no  Is patient constipated or take laxatives? no  Does patient have a history of alcohol/drug use? no  Is patient on Coumadin, Plavix, ASA or any other blood thinner? no  Medications: metformin 500 mg bid, simvastatin 20 mg daily, metoprolol 25 mg bid, lisinopril 40 mg daily,   Allergies: nkda  Pharmacy:   Medication Adjustment:   PROCEDURE DATE & TIME:  07/18/19 at 215 (1:15) - COVID 07/16/19 at 2 pm

## 2019-07-11 ENCOUNTER — Other Ambulatory Visit (INDEPENDENT_AMBULATORY_CARE_PROVIDER_SITE_OTHER): Payer: Self-pay | Admitting: *Deleted

## 2019-07-12 DIAGNOSIS — X32XXXD Exposure to sunlight, subsequent encounter: Secondary | ICD-10-CM | POA: Diagnosis not present

## 2019-07-12 DIAGNOSIS — L570 Actinic keratosis: Secondary | ICD-10-CM | POA: Diagnosis not present

## 2019-07-12 DIAGNOSIS — D0439 Carcinoma in situ of skin of other parts of face: Secondary | ICD-10-CM | POA: Diagnosis not present

## 2019-07-14 NOTE — Telephone Encounter (Signed)
Esophagogastroduodenoscopy and esophageal dilation with conscious sedation

## 2019-07-16 ENCOUNTER — Other Ambulatory Visit: Payer: Self-pay

## 2019-07-16 ENCOUNTER — Other Ambulatory Visit (HOSPITAL_COMMUNITY)
Admission: RE | Admit: 2019-07-16 | Discharge: 2019-07-16 | Disposition: A | Discharge status: Home / Self Care | Payer: Medicare HMO | Source: Ambulatory Visit | Attending: Internal Medicine | Admitting: Internal Medicine

## 2019-07-16 DIAGNOSIS — Z20822 Contact with and (suspected) exposure to covid-19: Secondary | ICD-10-CM | POA: Diagnosis not present

## 2019-07-16 DIAGNOSIS — Z01812 Encounter for preprocedural laboratory examination: Secondary | ICD-10-CM | POA: Diagnosis not present

## 2019-07-16 LAB — SARS CORONAVIRUS 2 (TAT 6-24 HRS): SARS Coronavirus 2: NEGATIVE

## 2019-07-18 ENCOUNTER — Ambulatory Visit (HOSPITAL_COMMUNITY)
Admission: RE | Admit: 2019-07-18 | Discharge: 2019-07-18 | Disposition: A | Discharge status: Home / Self Care | Payer: Medicare HMO | Attending: Internal Medicine | Admitting: Internal Medicine

## 2019-07-18 ENCOUNTER — Encounter (HOSPITAL_COMMUNITY)
Admission: RE | Disposition: A | Discharge status: Home / Self Care | Payer: Self-pay | Source: Home / Self Care | Attending: Internal Medicine

## 2019-07-18 ENCOUNTER — Other Ambulatory Visit: Payer: Self-pay

## 2019-07-18 ENCOUNTER — Encounter (HOSPITAL_COMMUNITY): Payer: Self-pay | Admitting: Internal Medicine

## 2019-07-18 DIAGNOSIS — K449 Diaphragmatic hernia without obstruction or gangrene: Secondary | ICD-10-CM | POA: Diagnosis not present

## 2019-07-18 DIAGNOSIS — Z7984 Long term (current) use of oral hypoglycemic drugs: Secondary | ICD-10-CM | POA: Insufficient documentation

## 2019-07-18 DIAGNOSIS — K222 Esophageal obstruction: Secondary | ICD-10-CM | POA: Diagnosis not present

## 2019-07-18 DIAGNOSIS — R1314 Dysphagia, pharyngoesophageal phase: Secondary | ICD-10-CM | POA: Diagnosis not present

## 2019-07-18 DIAGNOSIS — Q394 Esophageal web: Secondary | ICD-10-CM | POA: Diagnosis not present

## 2019-07-18 DIAGNOSIS — Z79899 Other long term (current) drug therapy: Secondary | ICD-10-CM | POA: Insufficient documentation

## 2019-07-18 DIAGNOSIS — Z87891 Personal history of nicotine dependence: Secondary | ICD-10-CM | POA: Diagnosis not present

## 2019-07-18 DIAGNOSIS — R1319 Other dysphagia: Secondary | ICD-10-CM

## 2019-07-18 DIAGNOSIS — E119 Type 2 diabetes mellitus without complications: Secondary | ICD-10-CM | POA: Diagnosis not present

## 2019-07-18 DIAGNOSIS — E7800 Pure hypercholesterolemia, unspecified: Secondary | ICD-10-CM | POA: Insufficient documentation

## 2019-07-18 DIAGNOSIS — I10 Essential (primary) hypertension: Secondary | ICD-10-CM | POA: Diagnosis not present

## 2019-07-18 DIAGNOSIS — Z7982 Long term (current) use of aspirin: Secondary | ICD-10-CM | POA: Diagnosis not present

## 2019-07-18 DIAGNOSIS — R12 Heartburn: Secondary | ICD-10-CM | POA: Insufficient documentation

## 2019-07-18 HISTORY — PX: ESOPHAGOGASTRODUODENOSCOPY: SHX5428

## 2019-07-18 HISTORY — PX: ESOPHAGEAL DILATION: SHX303

## 2019-07-18 LAB — GLUCOSE, CAPILLARY: Glucose-Capillary: 123 mg/dL — ABNORMAL HIGH (ref 70–99)

## 2019-07-18 SURGERY — EGD (ESOPHAGOGASTRODUODENOSCOPY)
Anesthesia: Moderate Sedation

## 2019-07-18 MED ORDER — STERILE WATER FOR IRRIGATION IR SOLN
Status: DC | PRN
  Administered 2019-07-18: 09:00:00 1.5 mL

## 2019-07-18 MED ORDER — LIDOCAINE VISCOUS HCL 2 % MT SOLN
OROMUCOSAL | Status: AC
  Filled 2019-07-18: qty 15

## 2019-07-18 MED ORDER — LIDOCAINE VISCOUS HCL 2 % MT SOLN
OROMUCOSAL | Status: DC | PRN
  Administered 2019-07-18: 4 mL via OROMUCOSAL

## 2019-07-18 MED ORDER — MEPERIDINE HCL 50 MG/ML IJ SOLN
INTRAMUSCULAR | Status: DC | PRN
  Administered 2019-07-18 (×2): 20 mg via INTRAVENOUS

## 2019-07-18 MED ORDER — MIDAZOLAM HCL 5 MG/5ML IJ SOLN
INTRAMUSCULAR | Status: DC | PRN
  Administered 2019-07-18 (×3): 1 mg via INTRAVENOUS

## 2019-07-18 MED ORDER — MIDAZOLAM HCL 5 MG/5ML IJ SOLN
INTRAMUSCULAR | Status: AC
  Filled 2019-07-18: qty 10

## 2019-07-18 MED ORDER — FAMOTIDINE 20 MG PO TABS: 20.0000 mg | ORAL_TABLET | Freq: Every day | ORAL | Status: DC

## 2019-07-18 MED ORDER — PANTOPRAZOLE SODIUM 40 MG PO TBEC: 40.0000 mg | DELAYED_RELEASE_TABLET | Freq: Every day | ORAL | 5 refills | Status: AC

## 2019-07-18 MED ORDER — MEPERIDINE HCL 50 MG/ML IJ SOLN
INTRAMUSCULAR | Status: AC
  Filled 2019-07-18: qty 1

## 2019-07-18 MED ORDER — SODIUM CHLORIDE 0.9 % IV SOLN
INTRAVENOUS | Status: DC
  Administered 2019-07-18: 1000 mL via INTRAVENOUS

## 2019-07-18 NOTE — H&P (Signed)
Wyatt [name] is an 86 y.o. male.   Chief Complaint: Patient is here for esophagogastroduodenoscopy and esophageal dilation. HPI: Patient presents with 1 year history of dysphagia to solids.  He states early on he would have an episode sporadically.  Over the last few months he has had multiple episodes.  He states one episode lasted for 45 minutes.  He points to upper sternal area site of bolus obstruction.  He has heartburn once in a while and takes Tums.  He has lost few pounds but he believes it is because he is having dental work done.  He denies abdominal pain or melena. Last aspirin dose was 2 or 3 days ago.  Past Medical History:  Diagnosis Date  . Diabetes mellitus   . Hypercholesteremia   . Hypertension       History of colonic polyps.  Past Surgical History:  Procedure Laterality Date  . CARDIAC CATHETERIZATION    . CHOLECYSTECTOMY N/A 06/26/2012   Procedure: LAPAROSCOPIC CHOLECYSTECTOMY;  Surgeon: Donato Heinz, MD;  Location: [facility];  Service: General;  Laterality: N/A;  . COLONOSCOPY  06/10/2011   Procedure: COLONOSCOPY;  Surgeon: Rogene Houston, MD;  Location: [facility];  Service: Endoscopy;  Laterality: N/A;  1030    Family History  Problem Relation Age of Onset  . Colon cancer Mother    Social History:  reports that he has quit smoking. He has a 7.00 pack-year smoking history. He has quit using smokeless tobacco. He reports that he does not drink alcohol or use drugs.  Allergies: No Known Allergies  Medications Prior to Admission  Medication Sig Dispense Refill  . aspirin EC 81 MG tablet Take 81 mg by mouth daily.     Marland Kitchen ibuprofen (ADVIL,MOTRIN) 200 MG tablet Take 400 mg by mouth every 6 (six) hours as needed for pain.    Marland Kitchen lisinopril (PRINIVIL,ZESTRIL) 40 MG tablet Take 40 mg by mouth daily.     . metFORMIN (GLUCOPHAGE) 500 MG tablet Take 500 mg by mouth 2 (two) times daily with a meal.    . metoprolol tartrate (LOPRESSOR) 25 MG tablet Take 12.5 mg by mouth  2 (two) times daily.    . simvastatin (ZOCOR) 20 MG tablet Take 20 mg by mouth daily.     . nitroGLYCERIN (NITROSTAT) 0.4 MG SL tablet Place 0.4 mg under the tongue every 5 (five) minutes as needed. For chest pain      Results for orders placed or performed during the hospital encounter of 07/18/19 (from the past 48 hour(s))  Glucose, capillary     Status: Abnormal   Collection Time: 07/18/19  8:06 AM  Result Value Ref Range   Glucose-Capillary 123 (H) 70 - 99 mg/dL    Comment: Glucose reference range applies only to samples taken after fasting for at least 8 hours.   No results found.  Review of Systems  Blood pressure 139/67, pulse (!) 58, temperature 97.7 F (36.5 C), temperature source Oral, resp. rate 14, height 6' (1.829 m), weight 68 kg, SpO2 98 %. Physical Exam  Constitutional: He appears well-developed and well-nourished.  HENT:  Mouth/Throat: Oropharynx is clear and moist.  Eyes: Conjunctivae are normal. No scleral icterus.  Neck: No thyromegaly present.  Cardiovascular: Normal rate, regular rhythm and normal heart sounds.  No murmur heard. Respiratory: Effort normal and breath sounds normal.  GI: Soft. He exhibits no distension and no mass. There is no abdominal tenderness.  Musculoskeletal:  General: No edema.  Lymphadenopathy:    He has no cervical adenopathy.  Neurological: He is alert.  Skin: Skin is warm.  He has focal facial pressure scar from recent intervention by a dermatologist.     Assessment/Plan Esophageal dysphagia. Esophagogastroduodenoscopy with esophageal dilation.  Hildred Laser, MD 07/18/2019, 8:29 AM

## 2019-07-18 NOTE — Op Note (Signed)
[facility] Patient Name: Wyatt [name] Procedure Date: 07/18/2019 8:15 AM MRN: [id number] Date of Birth: 1933-05-24 Attending MD: Hildred Laser , MD CSN: [id number] Age: 86 Admit Type: Outpatient Procedure:                Upper GI endoscopy Indications:              Esophageal dysphagia Providers:                Hildred Laser, MD, Jeanann Lewandowsky. Sharon Seller, RN, Raphael Gibney, Technician Referring MD:             Asencion Noble, MD Medicines:                Lidocaine jelly, Meperidine 40 mg IV, Midazolam 3                            mg IV Complications:            No immediate complications. Estimated Blood Loss:     Estimated blood loss was minimal. Procedure:                Pre-Anesthesia Assessment:                           - Prior to the procedure, a History and Physical                            was performed, and patient medications and                            allergies were reviewed. The patient's tolerance of                            previous anesthesia was also reviewed. The risks                            and benefits of the procedure and the sedation                            options and risks were discussed with the patient.                            All questions were answered, and informed consent                            was obtained. Prior Anticoagulants: The patient has                            taken no previous anticoagulant or antiplatelet                            agents except for aspirin and has taken no previous  anticoagulant or antiplatelet agents except for                            NSAID medication. ASA Grade Assessment: II - A                            patient with mild systemic disease. After reviewing                            the risks and benefits, the patient was deemed in                            satisfactory condition to undergo the procedure.                           After obtaining  informed consent, the endoscope was                            passed under direct vision. Throughout the                            procedure, the patient's blood pressure, pulse, and                            oxygen saturations were monitored continuously. The                            [id number]) scope was introduced through the                            mouth, and advanced to the second part of duodenum.                            The upper GI endoscopy was accomplished without                            difficulty. The patient tolerated the procedure                            well. Scope In: 8:38:38 AM Scope Out: 8:51:07 AM Total Procedure Duration: 0 hours 12 minutes 29 seconds  Findings:      The hypopharynx was normal.      A web was found in the proximal esophagus.      A moderate Schatzki ring was found at the gastroesophageal junction. The       scope was withdrawn. Dilation was performed with a Maloney dilator with       mild resistance at 52 Fr. The dilation site was examined following       endoscope reinsertion and showed moderate mucosal disruption at proximal       and distal esophagus extending to GEJ. Moderate improvement in luminal       narrowing and no perforation.      A 3 cm hiatal hernia was present.      The entire examined stomach was normal.  The duodenal bulb and second portion of the duodenum were normal. Impression:               - Normal hypopharynx.                           - Web in the proximal esophagus.                           - Moderate Schatzki ring. Dilated.                           - 3 cm hiatal hernia.                           - Normal stomach.                           - Normal duodenal bulb and second portion of the                            duodenum.                           - No specimens collected. Moderate Sedation:      Moderate (conscious) sedation was administered by the endoscopy nurse       and supervised by  the endoscopist. The following parameters were       monitored: oxygen saturation, heart rate, blood pressure, CO2       capnography and response to care. Total physician intraservice time was       16 minutes. Recommendation:           - Patient has a contact number available for                            emergencies. The signs and symptoms of potential                            delayed complications were discussed with the                            patient. Return to normal activities tomorrow.                            Written discharge instructions were provided to the                            patient.                           - Mechanical soft diet today.                           - Continue present medications.                           - No aspirin, ibuprofen, naproxen, or other  non-steroidal anti-inflammatory drugs for 7 days.                           - Use Protonix (pantoprazole) 40 mg PO daily. Procedure Code(s):        --- Professional ---                           [phone], Esophagogastroduodenoscopy, flexible,                            transoral; diagnostic, including collection of                            specimen(s) by brushing or washing, when performed                            (separate procedure)                           43450, Dilation of esophagus, by unguided sound or                            bougie, single or multiple passes                           G0500, Moderate sedation services provided by the                            same physician or other qualified health care                            professional performing a gastrointestinal                            endoscopic service that sedation supports,                            requiring the presence of an independent trained                            observer to assist in the monitoring of the                            patient's level of consciousness and  physiological                            status; initial 15 minutes of intra-service time;                            patient age 33 years or older (additional time may                            be reported with [phone], as appropriate) Diagnosis Code(s):        --- Professional ---  Q39.4, Esophageal web                           K22.2, Esophageal obstruction                           K44.9, Diaphragmatic hernia without obstruction or                            gangrene                           R13.14, Dysphagia, pharyngoesophageal phase CPT copyright 2019 [employer]. All rights reserved. The codes documented in this report are preliminary and upon coder review may  be revised to meet current compliance requirements. Hildred Laser, MD Hildred Laser, MD 07/18/2019 9:05:41 AM This report has been signed electronically. Number of Addenda: 0

## 2019-07-18 NOTE — Discharge Instructions (Signed)
No aspirin or NSAIDs for 1 week. Resume other medications as before. Pantoprazole 40 mg by mouth 30 minutes before breakfast daily. Pepcid/famotidine OTC 20 mg daily at bedtime for 1 month and stop. Mechanical soft diet for 24 hours.  Thereafter usual diet. No driving for 24 hours. Office visit in 2 months.   Upper Endoscopy, Adult, Care After This sheet gives you information about how to care for yourself after your procedure. Your health care provider may also give you more specific instructions. If you have problems or questions, contact your health care provider. What can I expect after the procedure? After the procedure, it is common to have:  A sore throat.  Mild stomach pain or discomfort.  Bloating.  Nausea. Follow these instructions at home:   Follow instructions from your health care provider about what to eat or drink after your procedure.  Return to your normal activities as told by your health care provider. Ask your health care provider what activities are safe for you.  Take over-the-counter and prescription medicines only as told by your health care provider.  Do not drive for 24 hours if you were given a sedative during your procedure.  Keep all follow-up visits as told by your health care provider. This is important. Contact a health care provider if you have:  A sore throat that lasts longer than one day.  Trouble swallowing. Get help right away if:  You vomit blood or your vomit looks like coffee grounds.  You have: ? A fever. ? Bloody, black, or tarry stools. ? A severe sore throat or you cannot swallow. ? Difficulty breathing. ? Severe pain in your chest or abdomen. Summary  After the procedure, it is common to have a sore throat, mild stomach discomfort, bloating, and nausea.  Do not drive for 24 hours if you were given a sedative during the procedure.  Follow instructions from your health care provider about what to eat or drink after your  procedure.  Return to your normal activities as told by your health care provider. This information is not intended to replace advice given to you by your health care provider. Make sure you discuss any questions you have with your health care provider. Document Revised: 10/25/2017 Document Reviewed: 10/03/2017 Elsevier Patient Education  [place] A soft-food eating plan includes foods that are safe and easy to chew and swallow. Your health care provider or dietitian can help you find foods and flavors that fit into this plan. Follow this plan until your health care provider or dietitian says it is safe to start eating other foods and food textures. What are tips for following this plan? General guidelines   Take small bites of food, or cut food into pieces about  inch or smaller. Bite-sized pieces of food are easier to chew and swallow.  Eat moist foods. Avoid overly dry foods.  Avoid foods that: ? Are difficult to swallow, such as dry, chunky, crispy, or sticky foods. ? Are difficult to chew, such as hard, tough, or stringy foods. ? Contain nuts, seeds, or fruits.  Follow instructions from your dietitian about the types of liquids that are safe for you to swallow. You may be allowed to have: ? Thick liquids only. This includes only liquids that are thicker than honey. ? Thin and thick liquids. This includes all beverages and foods that become liquid at room temperature.  To make thick liquids: ? Purchase a commercial liquid thickening powder.  These are available at grocery stores and pharmacies. ? Mix the thickener into liquids according to instructions on the label. ? Purchase ready-made thickened liquids. ? Thicken soup by pureeing, straining to remove chunks, and adding flour, potato flakes, or corn starch. ? Add commercial thickener to foods that become liquid at room temperature, such as milk shakes, yogurt, ice cream, gelatin, and  sherbet.  Ask your health care provider whether you need to take a fiber supplement. Cooking  Cook meats so they stay tender and moist. Use methods like braising, stewing, or baking in liquid.  Cook vegetables and fruit until they are soft enough to be mashed with a fork.  Peel soft, fresh fruits such as peaches, nectarines, and melons.  When making soup, make sure chunks of meat and vegetables are smaller than  inch.  Reheat leftover foods slowly so that a tough crust does not form. What foods are allowed? The items listed below may not be a complete list. Talk with your dietitian about what dietary choices are best for you. Grains Breads, muffins, pancakes, or waffles moistened with syrup, jelly, or butter. Dry cereals well-moistened with milk. Moist, cooked cereals. Well-cooked pasta and rice. Vegetables All soft-cooked vegetables. Shredded lettuce. Fruits All canned and cooked fruits. Soft, peeled fresh fruits. Strawberries. Dairy Milk. Cream. Yogurt. Cottage cheese. Soft cheese without the rind. Meats and other protein foods Tender, moist ground meat, poultry, or fish. Meat cooked in gravy or sauces. Eggs. Sweets and desserts Ice cream. Milk shakes. Sherbet. Pudding. Fats and oils Butter. Margarine. Olive, canola, sunflower, and grapeseed oil. Smooth salad dressing. Smooth cream cheese. Mayonnaise. Gravy. What foods are not allowed? The items listed bemay not be a complete list. Talk with your dietitian about what dietary choices are best for you. Grains Coarse or dry cereals, such as bran, granola, and shredded wheat. Tough or chewy crusty breads, such as [place] bread or baguettes. Breads with nuts, seeds, or fruit. Vegetables All raw vegetables. Cooked corn. Cooked vegetables that are tough or stringy. Tough, crisp, fried potatoes and potato skins. Fruits Fresh fruits with skins or seeds, or both, such as apples, pears, and grapes. Stringy, high-pulp fruits, such as  papaya, pineapple, coconut, and mango. Fruit leather and all dried fruit. Dairy Yogurt with nuts or coconut. Meats and other protein foods Hard, dry sausages. Dry meat, poultry, or fish. Meats with gristle. Fish with bones. Fried meat or fish. Lunch meat and hotdogs. Nuts and seeds. Chunky peanut butter or other nut butters. Sweets and desserts Cakes or cookies that are very dry or chewy. Desserts with dried fruit, nuts, or coconut. Fried pastries. Very rich pastries. Fats and oils Cream cheese with fruit or nuts. Salad dressings with seeds or chunks. Summary  A soft-food eating plan includes foods that are safe and easy to swallow. Generally, the foods should be soft enough to be mashed with a fork.  Avoid foods that are dry, hard to chew, crunchy, sticky, stringy, or crispy.  Ask your health care provider whether you need to thicken your liquids and if you need to take a fiber supplement. This information is not intended to replace advice given to you by your health care provider. Make sure you discuss any questions you have with your health care provider. Document Revised: 08/24/2018 Document Reviewed: 07/06/2016 Elsevier Patient Education  [place].  Gastroesophageal Reflux Disease, Adult Gastroesophageal reflux (GER) happens when acid from the stomach flows up into the tube that connects the mouth and the stomach (  esophagus). Normally, food travels down the esophagus and stays in the stomach to be digested. However, when a person has GER, food and stomach acid sometimes move back up into the esophagus. If this becomes a more serious problem, the person may be diagnosed with a disease called gastroesophageal reflux disease (GERD). GERD occurs when the reflux:  Happens often.  Causes frequent or severe symptoms.  Causes problems such as damage to the esophagus. When stomach acid comes in contact with the esophagus, the acid may cause soreness (inflammation) in the esophagus.  Over time, GERD may create small holes (ulcers) in the lining of the esophagus. What are the causes? This condition is caused by a problem with the muscle between the esophagus and the stomach (lower esophageal sphincter, or LES). Normally, the LES muscle closes after food passes through the esophagus to the stomach. When the LES is weakened or abnormal, it does not close properly, and that allows food and stomach acid to go back up into the esophagus. The LES can be weakened by certain dietary substances, medicines, and medical conditions, including:  Tobacco use.  Pregnancy.  Having a hiatal hernia.  Alcohol use.  Certain foods and beverages, such as coffee, chocolate, onions, and peppermint. What increases the risk? You are more likely to develop this condition if you:  Have an increased body weight.  Have a connective tissue disorder.  Use NSAID medicines. What are the signs or symptoms? Symptoms of this condition include:  Heartburn.  Difficult or painful swallowing.  The feeling of having a lump in the throat.  Abitter taste in the mouth.  Bad breath.  Having a large amount of saliva.  Having an upset or bloated stomach.  Belching.  Chest pain. Different conditions can cause chest pain. Make sure you see your health care provider if you experience chest pain.  Shortness of breath or wheezing.  Ongoing (chronic) cough or a night-time cough.  Wearing away of tooth enamel.  Weight loss. How is this diagnosed? Your health care provider will take a medical history and perform a physical exam. To determine if you have mild or severe GERD, your health care provider may also monitor how you respond to treatment. You may also have tests, including:  A test to examine your stomach and esophagus with a small camera (endoscopy).  A test thatmeasures the acidity level in your esophagus.  A test thatmeasures how much pressure is on your esophagus.  A barium  swallow or modified barium swallow test to show the shape, size, and functioning of your esophagus. How is this treated? The goal of treatment is to help relieve your symptoms and to prevent complications. Treatment for this condition may vary depending on how severe your symptoms are. Your health care provider may recommend:  Changes to your diet.  Medicine.  Surgery. Follow these instructions at home: Eating and drinking   Follow a diet as recommended by your health care provider. This may involve avoiding foods and drinks such as: ? Coffee and tea (with or without caffeine). ? Drinks that containalcohol. ? Energy drinks and sports drinks. ? Carbonated drinks or sodas. ? Chocolate and cocoa. ? Peppermint and mint flavorings. ? Garlic and onions. ? Horseradish. ? Spicy and acidic foods, including peppers, chili powder, curry powder, vinegar, hot sauces, and barbecue sauce. ? Citrus fruit juices and citrus fruits, such as oranges, lemons, and limes. ? Tomato-based foods, such as red sauce, chili, salsa, and pizza with red sauce. ? [place] and  fatty foods, such as donuts, french fries, potato chips, and high-fat dressings. ? High-fat meats, such as hot dogs and fatty cuts of red and white meats, such as rib eye steak, sausage, ham, and bacon. ? High-fat dairy items, such as whole milk, butter, and cream cheese.  Eat small, frequent meals instead of large meals.  Avoid drinking large amounts of liquid with your meals.  Avoid eating meals during the 2-3 hours before bedtime.  Avoid lying down right after you eat.  Do not exercise right after you eat. Lifestyle   Do not use any products that contain nicotine or tobacco, such as cigarettes, e-cigarettes, and chewing tobacco. If you need help quitting, ask your health care provider.  Try to reduce your stress by using methods such as yoga or meditation. If you need help reducing stress, ask your health care provider.  If you are  overweight, reduce your weight to an amount that is healthy for you. Ask your health care provider for guidance about a safe weight loss goal. General instructions  Pay attention to any changes in your symptoms.  Take over-the-counter and prescription medicines only as told by your health care provider. Do not take aspirin, ibuprofen, or other NSAIDs unless your health care provider told you to do so.  Wear loose-fitting clothing. Do not wear anything tight around your waist that causes pressure on your abdomen.  Raise (elevate) the head of your bed about 6 inches (15 cm).  Avoid bending over if this makes your symptoms worse.  Keep all follow-up visits as told by your health care provider. This is important. Contact a health care provider if:  You have: ? New symptoms. ? Unexplained weight loss. ? Difficulty swallowing or it hurts to swallow. ? Wheezing or a persistent cough. ? A hoarse voice.  Your symptoms do not improve with treatment. Get help right away if you:  Have pain in your arms, neck, jaw, teeth, or back.  Feel sweaty, dizzy, or light-headed.  Have chest pain or shortness of breath.  Vomit and your vomit looks like blood or coffee grounds.  Faint.  Have stool that is bloody or black.  Cannot swallow, drink, or eat. Summary  Gastroesophageal reflux happens when acid from the stomach flows up into the esophagus. GERD is a disease in which the reflux happens often, causes frequent or severe symptoms, or causes problems such as damage to the esophagus.  Treatment for this condition may vary depending on how severe your symptoms are. Your health care provider may recommend diet and lifestyle changes, medicine, or surgery.  Contact a health care provider if you have new or worsening symptoms.  Take over-the-counter and prescription medicines only as told by your health care provider. Do not take aspirin, ibuprofen, or other NSAIDs unless your health care provider  told you to do so.  Keep all follow-up visits as told by your health care provider. This is important. This information is not intended to replace advice given to you by your health care provider. Make sure you discuss any questions you have with your health care provider. Document Revised: 11/09/2017 Document Reviewed: 11/09/2017 Elsevier Patient Education  [place].  Hiatal Hernia  A hiatal hernia occurs when part of the stomach slides above the muscle that separates the abdomen from the chest (diaphragm). A person can be born with a hiatal hernia (congenital), or it may develop over time. In almost all cases of hiatal hernia, only the top part of the  stomach pushes through the diaphragm. Many people have a hiatal hernia with no symptoms. The larger the hernia, the more likely it is that you will have symptoms. In some cases, a hiatal hernia allows stomach acid to flow back into the tube that carries food from your mouth to your stomach (esophagus). This may cause heartburn symptoms. Severe heartburn symptoms may mean that you have developed a condition called gastroesophageal reflux disease (GERD). What are the causes? This condition is caused by a weakness in the opening (hiatus) where the esophagus passes through the diaphragm to attach to the upper part of the stomach. A person may be born with a weakness in the hiatus, or a weakness can develop over time. What increases the risk? This condition is more likely to develop in:  Older people. Age is a major risk factor for a hiatal hernia, especially if you are over the age of 37.  Pregnant women.  People who are overweight.  People who have frequent constipation. What are the signs or symptoms? Symptoms of this condition usually develop in the form of GERD symptoms. Symptoms include:  Heartburn.  Belching.  Indigestion.  Trouble swallowing.  Coughing or wheezing.  Sore throat.  Hoarseness.  Chest pain.  Nausea  and vomiting. How is this diagnosed? This condition may be diagnosed during testing for GERD. Tests that may be done include:  X-rays of your stomach or chest.  An upper gastrointestinal (GI) series. This is an X-ray exam of your GI tract that is taken after you swallow a chalky liquid that shows up clearly on the X-ray.  Endoscopy. This is a procedure to look into your stomach using a thin, flexible tube that has a tiny camera and light on the end of it. How is this treated? This condition may be treated by:  Dietary and lifestyle changes to help reduce GERD symptoms.  Medicines. These may include: ? Over-the-counter antacids. ? Medicines that make your stomach empty more quickly. ? Medicines that block the production of stomach acid (H2 blockers). ? Stronger medicines to reduce stomach acid (proton pump inhibitors).  Surgery to repair the hernia, if other treatments are not helping. If you have no symptoms, you may not need treatment. Follow these instructions at home: Lifestyle and activity  Do not use any products that contain nicotine or tobacco, such as cigarettes and e-cigarettes. If you need help quitting, ask your health care provider.  Try to achieve and maintain a healthy body weight.  Avoid putting pressure on your abdomen. Anything that puts pressure on your abdomen increases the amount of acid that may be pushed up into your esophagus. ? Avoid bending over, especially after eating. ? Raise the head of your bed by putting blocks under the legs. This keeps your head and esophagus higher than your stomach. ? Do not wear tight clothing around your chest or stomach. ? Try not to strain when having a bowel movement, when urinating, or when lifting heavy objects. Eating and drinking  Avoid foods that can worsen GERD symptoms. These may include: ? Fatty foods, like fried foods. ? Citrus fruits, like oranges or lemon. ? Other foods and drinks that contain acid, like orange  juice or tomatoes. ? Spicy food. ? Chocolate.  Eat frequent small meals instead of three large meals a day. This helps prevent your stomach from getting too full. ? Eat slowly. ? Do not lie down right after eating. ? Do not eat 1-2 hours before bed.  Do not  drink beverages with caffeine. These include cola, coffee, cocoa, and tea.  Do not drink alcohol. General instructions  Take over-the-counter and prescription medicines only as told by your health care provider.  Keep all follow-up visits as told by your health care provider. This is important. Contact a health care provider if:  Your symptoms are not controlled with medicines or lifestyle changes.  You are having trouble swallowing.  You have coughing or wheezing that will not go away. Get help right away if:  Your pain is getting worse.  Your pain spreads to your arms, neck, jaw, teeth, or back.  You have shortness of breath.  You sweat for no reason.  You feel sick to your stomach (nauseous) or you vomit.  You vomit blood.  You have bright red blood in your stools.  You have black, tarry stools. This information is not intended to replace advice given to you by your health care provider. Make sure you discuss any questions you have with your health care provider. Document Revised: 04/15/2017 Document Reviewed: 12/06/2016 Elsevier Patient Education  [place].

## 2019-08-03 DIAGNOSIS — E785 Hyperlipidemia, unspecified: Secondary | ICD-10-CM | POA: Diagnosis not present

## 2019-08-03 DIAGNOSIS — E1140 Type 2 diabetes mellitus with diabetic neuropathy, unspecified: Secondary | ICD-10-CM | POA: Diagnosis not present

## 2019-08-03 DIAGNOSIS — Z79899 Other long term (current) drug therapy: Secondary | ICD-10-CM | POA: Diagnosis not present

## 2019-08-03 DIAGNOSIS — I10 Essential (primary) hypertension: Secondary | ICD-10-CM | POA: Diagnosis not present

## 2019-08-03 DIAGNOSIS — I2510 Atherosclerotic heart disease of native coronary artery without angina pectoris: Secondary | ICD-10-CM | POA: Diagnosis not present

## 2019-08-10 DIAGNOSIS — E1159 Type 2 diabetes mellitus with other circulatory complications: Secondary | ICD-10-CM | POA: Diagnosis not present

## 2019-08-10 DIAGNOSIS — E785 Hyperlipidemia, unspecified: Secondary | ICD-10-CM | POA: Diagnosis not present

## 2019-08-10 DIAGNOSIS — R1310 Dysphagia, unspecified: Secondary | ICD-10-CM | POA: Diagnosis not present

## 2019-08-10 DIAGNOSIS — I2510 Atherosclerotic heart disease of native coronary artery without angina pectoris: Secondary | ICD-10-CM | POA: Diagnosis not present

## 2019-08-27 DIAGNOSIS — H43813 Vitreous degeneration, bilateral: Secondary | ICD-10-CM | POA: Diagnosis not present

## 2019-08-27 DIAGNOSIS — H02831 Dermatochalasis of right upper eyelid: Secondary | ICD-10-CM | POA: Diagnosis not present

## 2019-08-27 DIAGNOSIS — Z961 Presence of intraocular lens: Secondary | ICD-10-CM | POA: Diagnosis not present

## 2019-08-27 DIAGNOSIS — D3131 Benign neoplasm of right choroid: Secondary | ICD-10-CM | POA: Diagnosis not present

## 2019-08-27 DIAGNOSIS — E119 Type 2 diabetes mellitus without complications: Secondary | ICD-10-CM | POA: Diagnosis not present

## 2019-08-27 DIAGNOSIS — H02834 Dermatochalasis of left upper eyelid: Secondary | ICD-10-CM | POA: Diagnosis not present

## 2019-10-23 ENCOUNTER — Ambulatory Visit (INDEPENDENT_AMBULATORY_CARE_PROVIDER_SITE_OTHER): Payer: Medicare HMO | Admitting: Internal Medicine

## 2019-11-08 DIAGNOSIS — Z1283 Encounter for screening for malignant neoplasm of skin: Secondary | ICD-10-CM | POA: Diagnosis not present

## 2019-11-08 DIAGNOSIS — D044 Carcinoma in situ of skin of scalp and neck: Secondary | ICD-10-CM | POA: Diagnosis not present

## 2019-11-08 DIAGNOSIS — Z85828 Personal history of other malignant neoplasm of skin: Secondary | ICD-10-CM | POA: Diagnosis not present

## 2019-11-08 DIAGNOSIS — X32XXXD Exposure to sunlight, subsequent encounter: Secondary | ICD-10-CM | POA: Diagnosis not present

## 2019-11-08 DIAGNOSIS — Z08 Encounter for follow-up examination after completed treatment for malignant neoplasm: Secondary | ICD-10-CM | POA: Diagnosis not present

## 2019-11-08 DIAGNOSIS — L570 Actinic keratosis: Secondary | ICD-10-CM | POA: Diagnosis not present

## 2019-11-08 DIAGNOSIS — Z85820 Personal history of malignant melanoma of skin: Secondary | ICD-10-CM | POA: Diagnosis not present

## 2020-01-03 DIAGNOSIS — E785 Hyperlipidemia, unspecified: Secondary | ICD-10-CM | POA: Diagnosis not present

## 2020-01-03 DIAGNOSIS — I2510 Atherosclerotic heart disease of native coronary artery without angina pectoris: Secondary | ICD-10-CM | POA: Diagnosis not present

## 2020-01-03 DIAGNOSIS — Z79899 Other long term (current) drug therapy: Secondary | ICD-10-CM | POA: Diagnosis not present

## 2020-01-03 DIAGNOSIS — E1140 Type 2 diabetes mellitus with diabetic neuropathy, unspecified: Secondary | ICD-10-CM | POA: Diagnosis not present

## 2020-01-10 DIAGNOSIS — M542 Cervicalgia: Secondary | ICD-10-CM | POA: Diagnosis not present

## 2020-01-10 DIAGNOSIS — E1140 Type 2 diabetes mellitus with diabetic neuropathy, unspecified: Secondary | ICD-10-CM | POA: Diagnosis not present

## 2020-01-10 DIAGNOSIS — R7309 Other abnormal glucose: Secondary | ICD-10-CM | POA: Diagnosis not present

## 2020-01-10 DIAGNOSIS — I2510 Atherosclerotic heart disease of native coronary artery without angina pectoris: Secondary | ICD-10-CM | POA: Diagnosis not present

## 2020-02-11 DIAGNOSIS — X32XXXD Exposure to sunlight, subsequent encounter: Secondary | ICD-10-CM | POA: Diagnosis not present

## 2020-02-11 DIAGNOSIS — C44319 Basal cell carcinoma of skin of other parts of face: Secondary | ICD-10-CM | POA: Diagnosis not present

## 2020-02-11 DIAGNOSIS — D0439 Carcinoma in situ of skin of other parts of face: Secondary | ICD-10-CM | POA: Diagnosis not present

## 2020-02-11 DIAGNOSIS — L570 Actinic keratosis: Secondary | ICD-10-CM | POA: Diagnosis not present

## 2020-02-26 DIAGNOSIS — Z23 Encounter for immunization: Secondary | ICD-10-CM | POA: Diagnosis not present

## 2020-05-01 DIAGNOSIS — X32XXXD Exposure to sunlight, subsequent encounter: Secondary | ICD-10-CM | POA: Diagnosis not present

## 2020-05-01 DIAGNOSIS — Z85828 Personal history of other malignant neoplasm of skin: Secondary | ICD-10-CM | POA: Diagnosis not present

## 2020-05-01 DIAGNOSIS — Z08 Encounter for follow-up examination after completed treatment for malignant neoplasm: Secondary | ICD-10-CM | POA: Diagnosis not present

## 2020-05-01 DIAGNOSIS — D225 Melanocytic nevi of trunk: Secondary | ICD-10-CM | POA: Diagnosis not present

## 2020-05-01 DIAGNOSIS — Z85820 Personal history of malignant melanoma of skin: Secondary | ICD-10-CM | POA: Diagnosis not present

## 2020-05-01 DIAGNOSIS — L570 Actinic keratosis: Secondary | ICD-10-CM | POA: Diagnosis not present

## 2020-05-05 DIAGNOSIS — E1140 Type 2 diabetes mellitus with diabetic neuropathy, unspecified: Secondary | ICD-10-CM | POA: Diagnosis not present

## 2020-05-12 DIAGNOSIS — R7309 Other abnormal glucose: Secondary | ICD-10-CM | POA: Diagnosis not present

## 2020-05-12 DIAGNOSIS — I2510 Atherosclerotic heart disease of native coronary artery without angina pectoris: Secondary | ICD-10-CM | POA: Diagnosis not present

## 2020-05-12 DIAGNOSIS — E1140 Type 2 diabetes mellitus with diabetic neuropathy, unspecified: Secondary | ICD-10-CM | POA: Diagnosis not present

## 2020-07-14 ENCOUNTER — Other Ambulatory Visit: Payer: Self-pay | Admitting: Internal Medicine

## 2020-07-14 ENCOUNTER — Other Ambulatory Visit (HOSPITAL_COMMUNITY): Payer: Self-pay | Admitting: Internal Medicine

## 2020-07-14 ENCOUNTER — Ambulatory Visit (HOSPITAL_COMMUNITY)
Admission: RE | Admit: 2020-07-14 | Discharge: 2020-07-14 | Disposition: A | Discharge status: Home / Self Care | Payer: Medicare HMO | Source: Ambulatory Visit | Attending: Internal Medicine | Admitting: Internal Medicine

## 2020-07-14 ENCOUNTER — Other Ambulatory Visit: Payer: Self-pay

## 2020-07-14 DIAGNOSIS — S0990XD Unspecified injury of head, subsequent encounter: Secondary | ICD-10-CM | POA: Insufficient documentation

## 2020-07-14 DIAGNOSIS — S0990XA Unspecified injury of head, initial encounter: Secondary | ICD-10-CM | POA: Diagnosis not present

## 2020-07-14 DIAGNOSIS — G9389 Other specified disorders of brain: Secondary | ICD-10-CM | POA: Diagnosis not present

## 2020-08-11 DIAGNOSIS — L570 Actinic keratosis: Secondary | ICD-10-CM | POA: Diagnosis not present

## 2020-08-11 DIAGNOSIS — C44622 Squamous cell carcinoma of skin of right upper limb, including shoulder: Secondary | ICD-10-CM | POA: Diagnosis not present

## 2020-08-11 DIAGNOSIS — X32XXXD Exposure to sunlight, subsequent encounter: Secondary | ICD-10-CM | POA: Diagnosis not present

## 2020-08-27 DIAGNOSIS — H02834 Dermatochalasis of left upper eyelid: Secondary | ICD-10-CM | POA: Diagnosis not present

## 2020-08-27 DIAGNOSIS — E119 Type 2 diabetes mellitus without complications: Secondary | ICD-10-CM | POA: Diagnosis not present

## 2020-08-27 DIAGNOSIS — D3131 Benign neoplasm of right choroid: Secondary | ICD-10-CM | POA: Diagnosis not present

## 2020-08-27 DIAGNOSIS — H43813 Vitreous degeneration, bilateral: Secondary | ICD-10-CM | POA: Diagnosis not present

## 2020-08-27 DIAGNOSIS — H02831 Dermatochalasis of right upper eyelid: Secondary | ICD-10-CM | POA: Diagnosis not present

## 2020-08-27 DIAGNOSIS — Z961 Presence of intraocular lens: Secondary | ICD-10-CM | POA: Diagnosis not present

## 2020-09-03 DIAGNOSIS — I10 Essential (primary) hypertension: Secondary | ICD-10-CM | POA: Diagnosis not present

## 2020-09-03 DIAGNOSIS — I2510 Atherosclerotic heart disease of native coronary artery without angina pectoris: Secondary | ICD-10-CM | POA: Diagnosis not present

## 2020-09-03 DIAGNOSIS — Z79899 Other long term (current) drug therapy: Secondary | ICD-10-CM | POA: Diagnosis not present

## 2020-09-03 DIAGNOSIS — E1140 Type 2 diabetes mellitus with diabetic neuropathy, unspecified: Secondary | ICD-10-CM | POA: Diagnosis not present

## 2020-09-03 DIAGNOSIS — E785 Hyperlipidemia, unspecified: Secondary | ICD-10-CM | POA: Diagnosis not present

## 2020-09-10 DIAGNOSIS — I2510 Atherosclerotic heart disease of native coronary artery without angina pectoris: Secondary | ICD-10-CM | POA: Diagnosis not present

## 2020-09-10 DIAGNOSIS — I10 Essential (primary) hypertension: Secondary | ICD-10-CM | POA: Diagnosis not present

## 2020-09-10 DIAGNOSIS — E1140 Type 2 diabetes mellitus with diabetic neuropathy, unspecified: Secondary | ICD-10-CM | POA: Diagnosis not present

## 2020-09-10 DIAGNOSIS — E785 Hyperlipidemia, unspecified: Secondary | ICD-10-CM | POA: Diagnosis not present

## 2020-09-10 DIAGNOSIS — R7309 Other abnormal glucose: Secondary | ICD-10-CM | POA: Diagnosis not present

## 2020-10-06 DIAGNOSIS — Z85828 Personal history of other malignant neoplasm of skin: Secondary | ICD-10-CM | POA: Diagnosis not present

## 2020-10-06 DIAGNOSIS — X32XXXD Exposure to sunlight, subsequent encounter: Secondary | ICD-10-CM | POA: Diagnosis not present

## 2020-10-06 DIAGNOSIS — Z08 Encounter for follow-up examination after completed treatment for malignant neoplasm: Secondary | ICD-10-CM | POA: Diagnosis not present

## 2020-10-06 DIAGNOSIS — L570 Actinic keratosis: Secondary | ICD-10-CM | POA: Diagnosis not present

## 2020-10-06 DIAGNOSIS — C44622 Squamous cell carcinoma of skin of right upper limb, including shoulder: Secondary | ICD-10-CM | POA: Diagnosis not present

## 2020-12-03 DIAGNOSIS — E1140 Type 2 diabetes mellitus with diabetic neuropathy, unspecified: Secondary | ICD-10-CM | POA: Diagnosis not present

## 2020-12-10 DIAGNOSIS — R7309 Other abnormal glucose: Secondary | ICD-10-CM | POA: Diagnosis not present

## 2020-12-10 DIAGNOSIS — I10 Essential (primary) hypertension: Secondary | ICD-10-CM | POA: Diagnosis not present

## 2020-12-10 DIAGNOSIS — E1140 Type 2 diabetes mellitus with diabetic neuropathy, unspecified: Secondary | ICD-10-CM | POA: Diagnosis not present

## 2020-12-29 DIAGNOSIS — Z08 Encounter for follow-up examination after completed treatment for malignant neoplasm: Secondary | ICD-10-CM | POA: Diagnosis not present

## 2020-12-29 DIAGNOSIS — X32XXXD Exposure to sunlight, subsequent encounter: Secondary | ICD-10-CM | POA: Diagnosis not present

## 2020-12-29 DIAGNOSIS — L570 Actinic keratosis: Secondary | ICD-10-CM | POA: Diagnosis not present

## 2020-12-29 DIAGNOSIS — Z1283 Encounter for screening for malignant neoplasm of skin: Secondary | ICD-10-CM | POA: Diagnosis not present

## 2020-12-29 DIAGNOSIS — D0439 Carcinoma in situ of skin of other parts of face: Secondary | ICD-10-CM | POA: Diagnosis not present

## 2020-12-29 DIAGNOSIS — Z85820 Personal history of malignant melanoma of skin: Secondary | ICD-10-CM | POA: Diagnosis not present

## 2020-12-29 DIAGNOSIS — D225 Melanocytic nevi of trunk: Secondary | ICD-10-CM | POA: Diagnosis not present

## 2021-01-30 DIAGNOSIS — Z23 Encounter for immunization: Secondary | ICD-10-CM | POA: Diagnosis not present

## 2021-03-09 DIAGNOSIS — E1140 Type 2 diabetes mellitus with diabetic neuropathy, unspecified: Secondary | ICD-10-CM | POA: Diagnosis not present

## 2021-03-16 DIAGNOSIS — E1140 Type 2 diabetes mellitus with diabetic neuropathy, unspecified: Secondary | ICD-10-CM | POA: Diagnosis not present

## 2021-03-16 DIAGNOSIS — I10 Essential (primary) hypertension: Secondary | ICD-10-CM | POA: Diagnosis not present

## 2021-03-16 DIAGNOSIS — R7309 Other abnormal glucose: Secondary | ICD-10-CM | POA: Diagnosis not present

## 2021-04-06 DIAGNOSIS — Z1283 Encounter for screening for malignant neoplasm of skin: Secondary | ICD-10-CM | POA: Diagnosis not present

## 2021-04-06 DIAGNOSIS — Z85820 Personal history of malignant melanoma of skin: Secondary | ICD-10-CM | POA: Diagnosis not present

## 2021-04-06 DIAGNOSIS — D0439 Carcinoma in situ of skin of other parts of face: Secondary | ICD-10-CM | POA: Diagnosis not present

## 2021-04-06 DIAGNOSIS — X32XXXD Exposure to sunlight, subsequent encounter: Secondary | ICD-10-CM | POA: Diagnosis not present

## 2021-04-06 DIAGNOSIS — Z08 Encounter for follow-up examination after completed treatment for malignant neoplasm: Secondary | ICD-10-CM | POA: Diagnosis not present

## 2021-04-06 DIAGNOSIS — D225 Melanocytic nevi of trunk: Secondary | ICD-10-CM | POA: Diagnosis not present

## 2021-04-06 DIAGNOSIS — Z85828 Personal history of other malignant neoplasm of skin: Secondary | ICD-10-CM | POA: Diagnosis not present

## 2021-04-06 DIAGNOSIS — L570 Actinic keratosis: Secondary | ICD-10-CM | POA: Diagnosis not present

## 2021-05-15 DIAGNOSIS — I10 Essential (primary) hypertension: Secondary | ICD-10-CM | POA: Diagnosis not present

## 2021-05-15 DIAGNOSIS — E785 Hyperlipidemia, unspecified: Secondary | ICD-10-CM | POA: Diagnosis not present

## 2021-05-19 ENCOUNTER — Other Ambulatory Visit: Payer: Self-pay | Admitting: Internal Medicine

## 2021-05-19 ENCOUNTER — Other Ambulatory Visit (HOSPITAL_COMMUNITY): Payer: Self-pay | Admitting: Internal Medicine

## 2021-05-19 DIAGNOSIS — K2100 Gastro-esophageal reflux disease with esophagitis, without bleeding: Secondary | ICD-10-CM | POA: Diagnosis not present

## 2021-05-19 DIAGNOSIS — R1011 Right upper quadrant pain: Secondary | ICD-10-CM | POA: Diagnosis not present

## 2021-05-21 DIAGNOSIS — D696 Thrombocytopenia, unspecified: Secondary | ICD-10-CM | POA: Insufficient documentation

## 2021-05-21 NOTE — Progress Notes (Signed)
[place], [place] [address]   CLINIC:  Medical Oncology/Hematology  Patient Care Team: Asencion Noble, MD as PCP - General (Internal Medicine)  CHIEF COMPLAINTS/PURPOSE OF CONSULTATION:  Evaluation of severe thrombocytopenia  HISTORY OF PRESENTING ILLNESS:  Wyatt [name] 88 y.o. male is here because of evaluation of thrombocytopenia, at the request of Dr. Willey Blade.  Today he reports feeling good. He reports pain on the right side on his abdomen starting 1 month ago; when the pain started it was sharp, and now the pain is dull and constant. He reports losing 5-10 lbs over the past weight unintentionally. His appetite is at baseline. He denies n/v/d, but he reports constipation and indigestion. He denies easy bruising and bleeding. He denies history of blood transfusions.   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 He denies chemical exposure. He quit smoking 55 years ago. His mother had anemia and received vitamin B-12 injections. His brother had kidney cancer, a sister had uterine cancer, and another sister had ovarian cancer.   MEDICAL HISTORY:  Past Medical History:  Diagnosis Date   Diabetes mellitus    Hypercholesteremia    Hypertension     SURGICAL HISTORY: Past Surgical History:  Procedure Laterality Date   CARDIAC CATHETERIZATION     CHOLECYSTECTOMY N/A 06/26/2012   Procedure: LAPAROSCOPIC CHOLECYSTECTOMY;  Surgeon: Donato Heinz, MD;  Location: [facility];  Service: General;  Laterality: N/A;   COLONOSCOPY  06/10/2011   Procedure: COLONOSCOPY;  Surgeon: Rogene Houston, MD;  Location: [facility];  Service: Endoscopy;  Laterality: N/A;  1030   ESOPHAGEAL DILATION N/A 07/18/2019   Procedure: ESOPHAGEAL DILATION;  Surgeon: Rogene Houston, MD;  Location: [facility];  Service: Endoscopy;  Laterality: N/A;   ESOPHAGOGASTRODUODENOSCOPY N/A 07/18/2019   Procedure: ESOPHAGOGASTRODUODENOSCOPY (EGD);  Surgeon: Rogene Houston, MD;  Location:  [facility];  Service: Endoscopy;  Laterality: N/A;  215 - moved to 8:30 per Lelon Frohlich, pt is aware    SOCIAL HISTORY: Social History   Socioeconomic History   Marital status: Married    Spouse name: Not on file   Number of children: Not on file   Years of education: Not on file   Highest education level: Not on file  Occupational History   Not on file  Tobacco Use   Smoking status: Former    Packs/day: 1.00    Years: 7.00    Pack years: 7.00    Types: Cigarettes   Smokeless tobacco: Former  Scientific laboratory technician Use: Never used  Substance and Sexual Activity   Alcohol use: No   Drug use: No   Sexual activity: Yes    Birth control/protection: None  Other Topics Concern   Not on file  Social History Narrative   Not on file   Social Determinants of Health   Financial Resource Strain: Not on file  Food Insecurity: Not on file  Transportation Needs: Not on file  Physical Activity: Not on file  Stress: Not on file  Social Connections: Not on file  Intimate Partner Violence: Not on file    FAMILY HISTORY: Family History  Problem Relation Age of Onset   Colon cancer Mother     ALLERGIES:  has No Known Allergies.  MEDICATIONS:  Current Outpatient Medications  Medication Sig Dispense Refill   aspirin EC 81 MG tablet Take 1 tablet (81 mg total) by mouth daily.     famotidine (PEPCID) 20 MG  tablet Take 1 tablet (20 mg total) by mouth at bedtime.     lisinopril (PRINIVIL,ZESTRIL) 40 MG tablet Take 40 mg by mouth daily.      metFORMIN (GLUCOPHAGE) 500 MG tablet Take 500 mg by mouth 2 (two) times daily with a meal.     metoprolol tartrate (LOPRESSOR) 25 MG tablet Take 12.5 mg by mouth 2 (two) times daily.     pantoprazole (PROTONIX) 40 MG tablet Take 1 tablet (40 mg total) by mouth daily before breakfast. 30 tablet 5   simvastatin (ZOCOR) 20 MG tablet Take 20 mg by mouth daily.      ibuprofen (ADVIL) 200 MG tablet Take 2 tablets (400 mg total) by mouth every 6 (six) hours as  needed. (Patient not taking: Reported on 05/22/2021) 30 tablet 0   nitroGLYCERIN (NITROSTAT) 0.4 MG SL tablet Place 0.4 mg under the tongue every 5 (five) minutes as needed. For chest pain (Patient not taking: Reported on 05/22/2021)     No current facility-administered medications for this visit.    REVIEW OF SYSTEMS:   Review of Systems  Constitutional:  Positive for unexpected weight change (-5-10 lbs). Negative for appetite change and fatigue.  Gastrointestinal:  Positive for abdominal pain (R side) and constipation. Negative for diarrhea, nausea and vomiting.  Hematological:  Does not bruise/bleed easily.  All other systems reviewed and are negative.   PHYSICAL EXAMINATION: ECOG PERFORMANCE STATUS: 1 - Symptomatic but completely ambulatory  Vitals:   05/22/21 0816  BP: 135/69  Pulse: 60  Resp: 18  Temp: 97.9 F (36.6 C)  SpO2: 99%   Filed Weights   05/22/21 0816  Weight: 142 lb 8 oz (64.6 kg)   Physical Exam Vitals reviewed.  Constitutional:      Appearance: Normal appearance.  Cardiovascular:     Rate and Rhythm: Normal rate and regular rhythm.     Pulses: Normal pulses.     Heart sounds: Normal heart sounds.  Pulmonary:     Effort: Pulmonary effort is normal.     Breath sounds: Normal breath sounds.  Abdominal:     Palpations: Abdomen is soft. There is no hepatomegaly, splenomegaly or mass.     Tenderness: There is no abdominal tenderness.  Musculoskeletal:     Right lower leg: No edema.     Left lower leg: No edema.  Lymphadenopathy:     Lower Body: No right inguinal adenopathy. No left inguinal adenopathy.  Neurological:     General: No focal deficit present.     Mental Status: He is alert and oriented to person, place, and time.  Psychiatric:        Mood and Affect: Mood normal.        Behavior: Behavior normal.     LABORATORY DATA:  I have reviewed the data as listed No results found for this or any previous visit (from the past 2160  hour(s)).  RADIOGRAPHIC STUDIES: I have personally reviewed the radiological images as listed and agreed with the findings in the report. No results found.  ASSESSMENT:  Severe thrombocytopenia: - Patient seen at the request of Dr. Willey Blade for severe thrombocytopenia. - CBC on 05/19/2021 with platelet count 33.  Hemoglobin, white cells were within normal limits. - CBC on 09/03/2020 with platelet count 192, hemoglobin 15.2, white count 7.1. - Review of EMR showed platelet count was low at 107 in 2014 when he had cholecystitis and cholecystectomy. - Denies any nausea or vomiting.  He reports loss of 5 to 10  pounds in the last year.  He reports decrease in appetite. - Reports right upper quadrant pain about 1 month, nagging type.  He has some constipation and indigestion.  No easy bruising or bleeding.  No prior history of transfusion. - New medication started was Protonix on the day of labs on 05/19/2021.   Social/family history: - Lives at home with his wife.  He is a retired Furniture conservator/restorer.  He quit smoking 50 years ago. - Brother had kidney cancer.  Sister had uterine cancer and another sister had ovarian cancer.   PLAN:  Severe thrombocytopenia: - We will repeat his CBC and review his smear.  We will check LDH and reticulocyte count. - Will evaluate for nutritional deficiencies including Z36, folic acid, MMA, copper.  We will check for infectious etiologies including hepatitis B, hep C and H. pylori.  We will also check for bone marrow infiltrative process with SPEP and free light chains. - Recommend flow cytometry to evaluate for acute leukemias/MDS. - If the above testing is not conclusive, recommend bone marrow aspiration and biopsy. - RTC 1 week for follow-up.  2.  Right upper quadrant pain: - He had cholecystectomy in 2014. - He has mild tenderness with no mass palpable in the right upper quadrant.  He reports nagging pain for the last 1 month. - Recommend imaging of the right upper  quadrant.  Right upper quadrant ultrasound is scheduled for next Tuesday.  We will follow-up on it.    All questions were answered. The patient knows to call the clinic with any problems, questions or concerns.  Derek Jack, MD 05/22/21 8:52 AM  [place] [phone]   I, Thana Ates, am acting as a scribe for Dr. Derek Jack.  I, Derek Jack MD, have reviewed the above documentation for accuracy and completeness, and I agree with the above.

## 2021-05-22 ENCOUNTER — Encounter (HOSPITAL_COMMUNITY): Payer: Medicare HMO | Admitting: Hematology

## 2021-05-22 ENCOUNTER — Inpatient Hospital Stay (HOSPITAL_COMMUNITY): Payer: Medicare HMO | Attending: Hematology | Admitting: Hematology

## 2021-05-22 ENCOUNTER — Other Ambulatory Visit: Payer: Self-pay

## 2021-05-22 ENCOUNTER — Encounter (HOSPITAL_COMMUNITY): Payer: Self-pay | Admitting: Hematology

## 2021-05-22 ENCOUNTER — Inpatient Hospital Stay (HOSPITAL_COMMUNITY): Payer: Medicare HMO

## 2021-05-22 DIAGNOSIS — I10 Essential (primary) hypertension: Secondary | ICD-10-CM | POA: Insufficient documentation

## 2021-05-22 DIAGNOSIS — E7800 Pure hypercholesterolemia, unspecified: Secondary | ICD-10-CM | POA: Diagnosis not present

## 2021-05-22 DIAGNOSIS — Z79899 Other long term (current) drug therapy: Secondary | ICD-10-CM | POA: Diagnosis not present

## 2021-05-22 DIAGNOSIS — R1011 Right upper quadrant pain: Secondary | ICD-10-CM | POA: Diagnosis not present

## 2021-05-22 DIAGNOSIS — D649 Anemia, unspecified: Secondary | ICD-10-CM | POA: Diagnosis not present

## 2021-05-22 DIAGNOSIS — Z87891 Personal history of nicotine dependence: Secondary | ICD-10-CM | POA: Diagnosis not present

## 2021-05-22 DIAGNOSIS — K5900 Constipation, unspecified: Secondary | ICD-10-CM | POA: Diagnosis not present

## 2021-05-22 DIAGNOSIS — D696 Thrombocytopenia, unspecified: Secondary | ICD-10-CM | POA: Insufficient documentation

## 2021-05-22 DIAGNOSIS — E119 Type 2 diabetes mellitus without complications: Secondary | ICD-10-CM | POA: Insufficient documentation

## 2021-05-22 LAB — CBC WITH DIFFERENTIAL/PLATELET
Abs Immature Granulocytes: 0.02 10*3/uL (ref 0.00–0.07)
Basophils Absolute: 0.1 10*3/uL (ref 0.0–0.1)
Basophils Relative: 1 %
Eosinophils Absolute: 0.2 10*3/uL (ref 0.0–0.5)
Eosinophils Relative: 2 %
HCT: 40.3 % (ref 39.0–52.0)
Hemoglobin: 13.3 g/dL (ref 13.0–17.0)
Immature Granulocytes: 0 %
Lymphocytes Relative: 19 %
Lymphs Abs: 1.3 10*3/uL (ref 0.7–4.0)
MCH: 29.1 pg (ref 26.0–34.0)
MCHC: 33 g/dL (ref 30.0–36.0)
MCV: 88.2 fL (ref 80.0–100.0)
Monocytes Absolute: 0.5 10*3/uL (ref 0.1–1.0)
Monocytes Relative: 7 %
Neutro Abs: 5 10*3/uL (ref 1.7–7.7)
Neutrophils Relative %: 71 %
Platelets: 32 10*3/uL — ABNORMAL LOW (ref 150–400)
RBC: 4.57 MIL/uL (ref 4.22–5.81)
RDW: 12.4 % (ref 11.5–15.5)
WBC: 7 10*3/uL (ref 4.0–10.5)
nRBC: 0 % (ref 0.0–0.2)

## 2021-05-22 LAB — RETICULOCYTES
Immature Retic Fract: 7.5 % (ref 2.3–15.9)
RBC.: 4.56 MIL/uL (ref 4.22–5.81)
Retic Count, Absolute: 56.1 10*3/uL (ref 19.0–186.0)
Retic Ct Pct: 1.2 % (ref 0.4–3.1)

## 2021-05-22 LAB — HEPATITIS C ANTIBODY: HCV Ab: NONREACTIVE

## 2021-05-22 LAB — HEPATIC FUNCTION PANEL
ALT: 17 U/L (ref 0–44)
AST: 19 U/L (ref 15–41)
Albumin: 4 g/dL (ref 3.5–5.0)
Alkaline Phosphatase: 59 U/L (ref 38–126)
Bilirubin, Direct: 0.1 mg/dL (ref 0.0–0.2)
Indirect Bilirubin: 0.6 mg/dL (ref 0.3–0.9)
Total Bilirubin: 0.7 mg/dL (ref 0.3–1.2)
Total Protein: 6.8 g/dL (ref 6.5–8.1)

## 2021-05-22 LAB — HEPATITIS B SURFACE ANTIGEN: Hepatitis B Surface Ag: NONREACTIVE

## 2021-05-22 LAB — LACTATE DEHYDROGENASE: LDH: 115 U/L (ref 98–192)

## 2021-05-22 LAB — VITAMIN B12: Vitamin B-12: 600 pg/mL (ref 180–914)

## 2021-05-22 LAB — HEPATITIS B CORE ANTIBODY, TOTAL: Hep B Core Total Ab: NONREACTIVE

## 2021-05-22 LAB — HEPATITIS B SURFACE ANTIBODY,QUALITATIVE: Hep B S Ab: NONREACTIVE

## 2021-05-22 LAB — FOLATE: Folate: 22.7 ng/mL (ref >5.9)

## 2021-05-22 NOTE — Patient Instructions (Addendum)
[place] at [facility] Discharge Instructions  You were seen and examined today by Dr. Delton Coombes. He is a Merchandiser, retail meaning that he specializes in the treatment of blood disorders and cancers. He discussed your medical history and the events that led up to you being here today. He discussed your most recent labs showing that your platelets are low. Dr. Delton Coombes recommends further blood work to see what may be causing this. Please keep follow up appointment as scheduled.   Thank you for choosing [place] at [facility] to provide your oncology and hematology care.  To afford each patient quality time with our provider, please arrive at least 15 minutes before your scheduled appointment time.   If you have a lab appointment with the [facility] please come in thru the Main Entrance and check in at the main information desk.  You need to re-schedule your appointment should you arrive 10 or more minutes late.  We strive to give you quality time with our providers, and arriving late affects you and other patients whose appointments are after yours.  Also, if you no show three or more times for appointments you may be dismissed from the clinic at the providers discretion.     Again, thank you for choosing [facility].  Our hope is that these requests will decrease the amount of time that you wait before being seen by our physicians.       _____________________________________________________________  Should you have questions after your visit to [facility], please contact our office at [phone] and follow the prompts.  Our office hours are 8:00 a.m. and 4:30 p.m. Monday - Friday.  Please note that voicemails left after 4:00 p.m. may not be returned until the following business day.  We are closed weekends and major holidays.  You do have access to a nurse 24-7, just call the main number to the  clinic [phone] and do not press any options, hold on the line and a nurse will answer the phone.    For prescription refill requests, have your pharmacy contact our office and allow 72 hours.    Due to Covid, you will need to wear a mask upon entering the hospital. If you do not have a mask, a mask will be given to you at the Main Entrance upon arrival. For doctor visits, patients may have 1 support person age 50 or older with them. For treatment visits, patients can not have anyone with them due to social distancing guidelines and our immunocompromised population.

## 2021-05-24 LAB — METHYLMALONIC ACID, SERUM: Methylmalonic Acid, Quantitative: 105 nmol/L (ref 0–378)

## 2021-05-25 LAB — H PYLORI, IGM, IGG, IGA AB
H Pylori IgG: 0.22 Index Value (ref 0.00–0.79)
H. Pylogi, Iga Abs: <9 units (ref 0.0–8.9)
H. Pylogi, Igm Abs: <9 units (ref 0.0–8.9)

## 2021-05-25 LAB — KAPPA/LAMBDA LIGHT CHAINS
Kappa free light chain: 26.1 mg/L — ABNORMAL HIGH (ref 3.3–19.4)
Kappa, lambda light chain ratio: 1.38 (ref 0.26–1.65)
Lambda free light chains: 18.9 mg/L (ref 5.7–26.3)

## 2021-05-25 LAB — SURGICAL PATHOLOGY

## 2021-05-26 ENCOUNTER — Ambulatory Visit (HOSPITAL_COMMUNITY)
Admission: RE | Admit: 2021-05-26 | Discharge: 2021-05-26 | Disposition: A | Discharge status: Home / Self Care | Payer: Medicare HMO | Source: Ambulatory Visit | Attending: Internal Medicine | Admitting: Internal Medicine

## 2021-05-26 ENCOUNTER — Other Ambulatory Visit: Payer: Self-pay

## 2021-05-26 DIAGNOSIS — R1011 Right upper quadrant pain: Secondary | ICD-10-CM | POA: Diagnosis not present

## 2021-05-26 DIAGNOSIS — N281 Cyst of kidney, acquired: Secondary | ICD-10-CM | POA: Diagnosis not present

## 2021-05-26 DIAGNOSIS — K760 Fatty (change of) liver, not elsewhere classified: Secondary | ICD-10-CM | POA: Diagnosis not present

## 2021-05-26 LAB — PROTEIN ELECTROPHORESIS, SERUM
A/G Ratio: 1.3 (ref 0.7–1.7)
Albumin ELP: 3.7 g/dL (ref 2.9–4.4)
Alpha-1-Globulin: 0.2 g/dL (ref 0.0–0.4)
Alpha-2-Globulin: 0.8 g/dL (ref 0.4–1.0)
Beta Globulin: 1 g/dL (ref 0.7–1.3)
Gamma Globulin: 0.8 g/dL (ref 0.4–1.8)
Globulin, Total: 2.8 g/dL (ref 2.2–3.9)
Total Protein ELP: 6.5 g/dL (ref 6.0–8.5)

## 2021-05-26 LAB — COPPER, SERUM: Copper: 97 ug/dL (ref 69–132)

## 2021-05-27 ENCOUNTER — Ambulatory Visit (HOSPITAL_COMMUNITY): Payer: Medicare HMO | Admitting: Hematology

## 2021-05-28 ENCOUNTER — Inpatient Hospital Stay (HOSPITAL_COMMUNITY): Payer: Medicare HMO | Admitting: Hematology

## 2021-05-28 ENCOUNTER — Other Ambulatory Visit: Payer: Self-pay

## 2021-05-28 VITALS — BP 133/81 | HR 51 | Temp 97.4°F | Resp 18 | Ht 72.0 in | Wt 144.0 lb

## 2021-05-28 DIAGNOSIS — R1011 Right upper quadrant pain: Secondary | ICD-10-CM | POA: Diagnosis not present

## 2021-05-28 DIAGNOSIS — E119 Type 2 diabetes mellitus without complications: Secondary | ICD-10-CM | POA: Diagnosis not present

## 2021-05-28 DIAGNOSIS — D696 Thrombocytopenia, unspecified: Secondary | ICD-10-CM | POA: Diagnosis not present

## 2021-05-28 DIAGNOSIS — I10 Essential (primary) hypertension: Secondary | ICD-10-CM | POA: Diagnosis not present

## 2021-05-28 DIAGNOSIS — E7800 Pure hypercholesterolemia, unspecified: Secondary | ICD-10-CM | POA: Diagnosis not present

## 2021-05-28 DIAGNOSIS — K5900 Constipation, unspecified: Secondary | ICD-10-CM | POA: Diagnosis not present

## 2021-05-28 DIAGNOSIS — Z79899 Other long term (current) drug therapy: Secondary | ICD-10-CM | POA: Diagnosis not present

## 2021-05-28 DIAGNOSIS — Z87891 Personal history of nicotine dependence: Secondary | ICD-10-CM | POA: Diagnosis not present

## 2021-05-28 NOTE — Progress Notes (Signed)
[facility] [address], [place] [address]   CLINIC:  Medical Oncology/Hematology  PCP:  Carylon Perches, MD [address] / [place] [address]  [phone]  REASON FOR VISIT:  Follow-up for severe thrombocytopenia  PRIOR THERAPY: none  CURRENT THERAPY: under work-up  INTERVAL HISTORY:  Wyatt [name], a 88 y.o. male, returns for routine follow-up for his severe thrombocytopenia. Wyatt [name] was last seen on 05/22/2021.  Today he reports feeling well. He denies any currently bleeding or black stools. He reports continued right sided abdominal pain which is not effected by movement or eating. He denies nausea, fever, and chills.   REVIEW OF SYSTEMS:  Review of Systems  Constitutional:  Negative for appetite change, chills, fatigue and fever.  HENT:   Negative for nosebleeds.   Respiratory:  Negative for hemoptysis.   Gastrointestinal:  Positive for abdominal pain (R side 5/10) and constipation. Negative for blood in stool and nausea.  Genitourinary:  Negative for hematuria.   Hematological:  Does not bruise/bleed easily.  All other systems reviewed and are negative.  PAST MEDICAL/SURGICAL HISTORY:  Past Medical History:  Diagnosis Date   Diabetes mellitus    Hypercholesteremia    Hypertension    Past Surgical History:  Procedure Laterality Date   CARDIAC CATHETERIZATION     CHOLECYSTECTOMY N/A 06/26/2012   Procedure: LAPAROSCOPIC CHOLECYSTECTOMY;  Surgeon: Fabio Bering, MD;  Location: [facility];  Service: General;  Laterality: N/A;   COLONOSCOPY  06/10/2011   Procedure: COLONOSCOPY;  Surgeon: [name], MD;  [facility];  Service: Endoscopy;  Laterality: N/A;  1030   ESOPHAGEAL DILATION N/A 07/18/2019   Procedure: ESOPHAGEAL DILATION;  Surgeon: [name], MD;  [facility];  Service: Endoscopy;  Laterality: N/A;   ESOPHAGOGASTRODUODENOSCOPY N/A 07/18/2019   Procedure: ESOPHAGOGASTRODUODENOSCOPY (EGD);   Surgeon: [name], MD;  [facility]: [facility];  Service: Endoscopy;  Laterality: N/A;  215 - moved to 8:30 per Dewayne Hatch, pt is aware    SOCIAL HISTORY:  Social History   Socioeconomic History   Marital status: Married    Spouse name: Not on file   Number of children: Not on file   Years of education: Not on file   Highest education level: Not on file  Occupational History   Not on file  Tobacco Use   Smoking status: Former    Packs/day: 1.00    Years: 7.00    Pack years: 7.00    Types: Cigarettes   Smokeless tobacco: Former  Building services engineer Use: Never used  Substance and Sexual Activity   Alcohol use: No   Drug use: No   Sexual activity: Yes    Birth control/protection: None  Other Topics Concern   Not on file  Social History Narrative   Not on file   Social Determinants of Health   Financial Resource Strain: Not on file  Food Insecurity: Not on file  Transportation Needs: Not on file  Physical Activity: Not on file  Stress: Not on file  Social Connections: Not on file  Intimate Partner Violence: Not on file    FAMILY HISTORY:  Family History  Problem Relation Age of Onset   Colon cancer Mother     CURRENT MEDICATIONS:  Current Outpatient Medications  Medication Sig Dispense Refill   aspirin EC 81 MG tablet Take 1 tablet (81 mg total) by mouth daily.     famotidine (PEPCID) 20  MG tablet Take 1 tablet (20 mg total) by mouth at bedtime.     lisinopril (PRINIVIL,ZESTRIL) 40 MG tablet Take 40 mg by mouth daily.      metFORMIN (GLUCOPHAGE) 1000 MG tablet Take 1,000 mg by mouth 2 (two) times daily.     metoprolol tartrate (LOPRESSOR) 25 MG tablet Take 12.5 mg by mouth 2 (two) times daily.     pantoprazole (PROTONIX) 40 MG tablet Take 1 tablet (40 mg total) by mouth daily before breakfast. 30 tablet 5   simvastatin (ZOCOR) 20 MG tablet Take 20 mg by mouth daily.      ibuprofen (ADVIL) 200 MG tablet Take 2 tablets (400 mg total) by mouth every 6 (six)  hours as needed. (Patient not taking: Reported on 05/28/2021) 30 tablet 0   nitroGLYCERIN (NITROSTAT) 0.4 MG SL tablet Place 0.4 mg under the tongue every 5 (five) minutes as needed. For chest pain (Patient not taking: Reported on 05/28/2021)     No current facility-administered medications for this visit.    ALLERGIES:  No Known Allergies  PHYSICAL EXAM:  Performance status (ECOG): 1 - Symptomatic but completely ambulatory  Vitals:   05/28/21 1514  BP: 133/81  Pulse: (!) 51  Resp: 18  Temp: (!) 97.4 F (36.3 C)  SpO2: 100%   Wt Readings from Last 3 Encounters:  05/28/21 143 lb 15.4 oz (65.3 kg)  05/22/21 142 lb 8 oz (64.6 kg)  07/18/19 150 lb (68 kg)   Physical Exam Vitals reviewed.  Constitutional:      Appearance: Normal appearance.  Cardiovascular:     Rate and Rhythm: Normal rate and regular rhythm.     Pulses: Normal pulses.     Heart sounds: Normal heart sounds.  Pulmonary:     Effort: Pulmonary effort is normal.     Breath sounds: Normal breath sounds.  Neurological:     General: No focal deficit present.     Mental Status: He is alert and oriented to person, place, and time.  Psychiatric:        Mood and Affect: Mood normal.        Behavior: Behavior normal.    LABORATORY DATA:  I have reviewed the labs as listed.  CBC Latest Ref Rng & Units 05/22/2021 06/29/2012 06/26/2012  WBC 4.0 - 10.5 K/uL 7.0 6.2 6.5  Hemoglobin 13.0 - 17.0 g/dL 53.7 4.8(O) 10.8(L)  Hematocrit 39.0 - 52.0 % 40.3 26.0(L) 31.5(L)  Platelets 150 - 400 K/uL 32(L) 183 132(L)   CMP Latest Ref Rng & Units 05/22/2021 06/29/2012 06/26/2012  Glucose 70 - 99 mg/dL - 707(E) 675(Q)  BUN 6 - 23 mg/dL - 7 10  Creatinine 4.92 - 1.35 mg/dL - 0.10 0.71  Sodium 219 - 145 mEq/L - 142 134(L)  Potassium 3.5 - 5.1 mEq/L - 3.9 4.1  Chloride 96 - 112 mEq/L - 105 97  CO2 19 - 32 mEq/L - 30 26  Calcium 8.4 - 10.5 mg/dL - 8.9 7.9(L)  Total Protein 6.5 - 8.1 g/dL 6.8 5.9(L) -  Total Bilirubin 0.3 - 1.2 mg/dL  0.7 0.4 -  Alkaline Phos 38 - 126 U/L 59 52 -  AST 15 - 41 U/L 19 25 -  ALT 0 - 44 U/L 17 38 -      Component Value Date/Time   RBC 4.56 05/22/2021 0913   RBC 4.57 05/22/2021 0912   MCV 88.2 05/22/2021 0912   MCH 29.1 05/22/2021 0912   MCHC 33.0 05/22/2021 0912  RDW 12.4 05/22/2021 0912   LYMPHSABS 1.3 05/22/2021 0912   MONOABS 0.5 05/22/2021 0912   EOSABS 0.2 05/22/2021 0912   BASOSABS 0.1 05/22/2021 0912    DIAGNOSTIC IMAGING:  I have independently reviewed the scans and discussed with the patient. US Abdomen Complete  Result Date: 05/26/2021 CLINICAL DATA:  Pain right upper quadrant previous studies including CT done on 2000 EXAM: ABDOMEN ULTRASOUND COMPLETE COMPARISON:  Previous studies including CT done on 06/29/2012 FINDINGS: Gallbladder: Gallbladder is not seen consistent with previous cholecystectomy. Common bile duct: Diameter: 5 mm Liver: There is increased echogenicity suggesting fatty infiltration. No focal abnormality is seen. Portal vein is patent on color Doppler imaging with normal direction of blood flow towards the liver. IVC: No abnormality visualized. Pancreas: Not adequately visualized. Spleen: Size and appearance within normal limits. Right Kidney: Length: 10.6 cm. Echogenicity within normal limits. No mass or hydronephrosis visualized. Left Kidney: Length: 10.9 cm. There is 3.5 cm cyst in the margin of left kidney. There is no hydronephrosis. Abdominal aorta: No aneurysm visualized. Other findings: None. IMPRESSION: Status post cholecystectomy. Fatty liver. Left renal cyst. Abdominal sonogram is otherwise unremarkable. Electronically Signed   By: Elmer Picker M.D.   On: 05/26/2021 15:23     ASSESSMENT:  Severe thrombocytopenia: - Patient seen at the request of Dr. Willey Blade for severe thrombocytopenia. - CBC on 05/19/2021 with platelet count 33.  Hemoglobin, white cells were within normal limits. - CBC on 09/03/2020 with platelet count 192, hemoglobin 15.2, white  count 7.1. - Review of EMR showed platelet count was low at 107 in 2014 when he had cholecystitis and cholecystectomy. - Denies any nausea or vomiting.  He reports loss of 5 to 10 pounds in the last year.  He reports decrease in appetite. - Reports right upper quadrant pain about 1 month, nagging type.  He has some constipation and indigestion.  No easy bruising or bleeding.  No prior history of transfusion. - New medication started was Protonix on the day of labs on 05/19/2021.    Social/family history: - Lives at home with his wife.  He is a retired Furniture conservator/restorer.  He quit smoking 50 years ago. - Brother had kidney cancer.  Sister had uterine cancer and another sister had ovarian cancer.   PLAN:  Severe thrombocytopenia: - We have reviewed her repeat CBC from 05/22/2021 which showed platelet count 32 with normal hemoglobin and white count with normal differential.  Hepatitis B and C serology was negative.  H. pylori was negative. - Flow cytometry showed relative abundance of natural killer cells, 38% of lymphocytes.  No monoclonal B-cell population or abnormal T-cell phenotype identified.  This may be reactive in nature. - Nutritional deficiency work-up was also negative.  LDH was normal.  SPEP is negative. - Recommend bone marrow aspiration and biopsy to evaluate for bone marrow infiltrative process including AML/MDS/lymphoma. - Would also request sending a FISH panel for AML or MDS depending on the diagnosis. - Bone marrow biopsy is normal, we can consider treating it as ITP.   2.  Right upper quadrant pain: - He had cholecystectomy in 2014. - Complained of nagging pain in the right upper quadrant, worse at times.  No clear aggravating or alleviating factors.  Denies any nausea, fevers or chills. - Reviewed ultrasound of the abdomen from 05/26/2021 which showed fatty infiltration of the liver.  Left renal cyst.  Otherwise unremarkable.  Orders placed this encounter:  Orders Placed This  Encounter  Procedures   CT Biopsy  CT BONE MARROW BIOPSY & ASPIRATION     Derek Jack, MD [place] [phone]   I, Thana Ates, am acting as a scribe for Dr. Derek Jack.  I, Derek Jack MD, have reviewed the above documentation for accuracy and completeness, and I agree with the above.

## 2021-05-28 NOTE — Patient Instructions (Addendum)
[place] at [facility] Discharge Instructions  You were seen and examined today by Dr. Delton Coombes. He reviewed your most recent labs and everything looks normal. He recommends you have a bone marrow biopsy to see what may be causing the platelets to be elevated. We will schedule this for you to be done at [facility] or [facility]. Please have someone drive you as you will be lightly sedated for the procedure. Please keep follow up appointment as scheduled.     Thank you for choosing [place] at [facility] to provide your oncology and hematology care.  To afford each patient quality time with our provider, please arrive at least 15 minutes before your scheduled appointment time.   If you have a lab appointment with the [facility] please come in thru the Main Entrance and check in at the main information desk.  You need to re-schedule your appointment should you arrive 10 or more minutes late.  We strive to give you quality time with our providers, and arriving late affects you and other patients whose appointments are after yours.  Also, if you no show three or more times for appointments you may be dismissed from the clinic at the providers discretion.     Again, thank you for choosing [facility].  Our hope is that these requests will decrease the amount of time that you wait before being seen by our physicians.       _____________________________________________________________  Should you have questions after your visit to [facility], please contact our office at [phone] and follow the prompts.  Our office hours are 8:00 a.m. and 4:30 p.m. Monday - Friday.  Please note that voicemails left after 4:00 p.m. may not be returned until the following business day.  We are closed weekends and major holidays.  You do have access to a nurse 24-7, just call the main number to the clinic [phone] and do not press any  options, hold on the line and a nurse will answer the phone.    For prescription refill requests, have your pharmacy contact our office and allow 72 hours.    Due to Covid, you will need to wear a mask upon entering the hospital. If you do not have a mask, a mask will be given to you at the Main Entrance upon arrival. For doctor visits, patients may have 1 support person age 37 or older with them. For treatment visits, patients can not have anyone with them due to social distancing guidelines and our immunocompromised population.

## 2021-06-12 LAB — FLOW CYTOMETRY

## 2021-06-14 ENCOUNTER — Other Ambulatory Visit: Payer: Self-pay | Admitting: Radiology

## 2021-06-16 ENCOUNTER — Ambulatory Visit (HOSPITAL_COMMUNITY)
Admission: RE | Admit: 2021-06-16 | Discharge: 2021-06-16 | Disposition: A | Discharge status: Home / Self Care | Payer: Medicare HMO | Source: Ambulatory Visit | Attending: Hematology | Admitting: Hematology

## 2021-06-16 ENCOUNTER — Other Ambulatory Visit: Payer: Self-pay

## 2021-06-16 ENCOUNTER — Encounter (HOSPITAL_COMMUNITY): Payer: Self-pay

## 2021-06-16 DIAGNOSIS — I10 Essential (primary) hypertension: Secondary | ICD-10-CM | POA: Diagnosis not present

## 2021-06-16 DIAGNOSIS — D696 Thrombocytopenia, unspecified: Secondary | ICD-10-CM | POA: Insufficient documentation

## 2021-06-16 DIAGNOSIS — E785 Hyperlipidemia, unspecified: Secondary | ICD-10-CM | POA: Insufficient documentation

## 2021-06-16 DIAGNOSIS — E119 Type 2 diabetes mellitus without complications: Secondary | ICD-10-CM | POA: Diagnosis not present

## 2021-06-16 DIAGNOSIS — R1030 Lower abdominal pain, unspecified: Secondary | ICD-10-CM | POA: Insufficient documentation

## 2021-06-16 DIAGNOSIS — D469 Myelodysplastic syndrome, unspecified: Secondary | ICD-10-CM | POA: Diagnosis not present

## 2021-06-16 LAB — CBC WITH DIFFERENTIAL/PLATELET
Abs Immature Granulocytes: 0.04 10*3/uL (ref 0.00–0.07)
Basophils Absolute: 0.1 10*3/uL (ref 0.0–0.1)
Basophils Relative: 1 %
Eosinophils Absolute: 0.2 10*3/uL (ref 0.0–0.5)
Eosinophils Relative: 3 %
HCT: 40.1 % (ref 39.0–52.0)
Hemoglobin: 13.2 g/dL (ref 13.0–17.0)
Immature Granulocytes: 1 %
Lymphocytes Relative: 19 %
Lymphs Abs: 1.6 10*3/uL (ref 0.7–4.0)
MCH: 28.3 pg (ref 26.0–34.0)
MCHC: 32.9 g/dL (ref 30.0–36.0)
MCV: 86.1 fL (ref 80.0–100.0)
Monocytes Absolute: 0.7 10*3/uL (ref 0.1–1.0)
Monocytes Relative: 8 %
Neutro Abs: 6 10*3/uL (ref 1.7–7.7)
Neutrophils Relative %: 68 %
Platelets: 53 10*3/uL — ABNORMAL LOW (ref 150–400)
RBC: 4.66 MIL/uL (ref 4.22–5.81)
RDW: 12.9 % (ref 11.5–15.5)
WBC: 8.7 10*3/uL (ref 4.0–10.5)
nRBC: 0 % (ref 0.0–0.2)

## 2021-06-16 LAB — GLUCOSE, CAPILLARY: Glucose-Capillary: 164 mg/dL — ABNORMAL HIGH (ref 70–99)

## 2021-06-16 MED ORDER — FENTANYL CITRATE (PF) 100 MCG/2ML IJ SOLN
INTRAMUSCULAR | Status: AC
  Filled 2021-06-16: qty 2

## 2021-06-16 MED ORDER — SODIUM CHLORIDE 0.9 % IV SOLN: INTRAVENOUS | Status: DC

## 2021-06-16 MED ORDER — LIDOCAINE HCL (PF) 1 % IJ SOLN
INTRAMUSCULAR | Status: AC | PRN
  Administered 2021-06-16: 20 mL

## 2021-06-16 MED ORDER — MIDAZOLAM HCL 2 MG/2ML IJ SOLN
INTRAMUSCULAR | Status: AC
  Filled 2021-06-16: qty 2

## 2021-06-16 MED ORDER — MIDAZOLAM HCL 2 MG/2ML IJ SOLN
INTRAMUSCULAR | Status: AC | PRN
  Administered 2021-06-16: 1 mg via INTRAVENOUS

## 2021-06-16 MED ORDER — FENTANYL CITRATE (PF) 100 MCG/2ML IJ SOLN
INTRAMUSCULAR | Status: AC | PRN
  Administered 2021-06-16: 50 ug via INTRAVENOUS

## 2021-06-16 NOTE — Discharge Instructions (Signed)
Please call Interventional Radiology clinic [phone] with any questions or concerns. ° °You may remove your dressing and shower tomorrow. ° ° °Bone Marrow Aspiration and Bone Marrow Biopsy, Adult, Care After °This sheet gives you information about how to care for yourself after your procedure. Your health care provider may also give you more specific instructions. If you have problems or questions, contact your health care provider. °What can I expect after the procedure? °After the procedure, it is common to have: °Mild pain and tenderness. °Swelling. °Bruising. °Follow these instructions at home: °Puncture site care °Follow instructions from your health care provider about how to take care of the puncture site. Make sure you: °Wash your hands with soap and water before and after you change your bandage (dressing). If soap and water are not available, use hand sanitizer. °Change your dressing as told by your health care provider. °Check your puncture site every day for signs of infection. Check for: °More redness, swelling, or pain. °Fluid or blood. °Warmth. °Pus or a bad smell.   °Activity °Return to your normal activities as told by your health care provider. Ask your health care provider what activities are safe for you. °Do not lift anything that is heavier than 10 lb (4.5 kg), or the limit that you are told, until your health care provider says that it is safe. °Do not drive for 24 hours if you were given a sedative during your procedure. °General instructions °Take over-the-counter and prescription medicines only as told by your health care provider. °Do not take baths, swim, or use a hot tub until your health care provider approves. Ask your health care provider if you may take showers. You may only be allowed to take sponge baths. °If directed, put ice on the affected area. To do this: °Put ice in a plastic bag. °Place a towel between your skin and the bag. °Leave the ice on for 20 minutes, 2-3 times a  day. °Keep all follow-up visits as told by your health care provider. This is important.   °Contact a health care provider if: °Your pain is not controlled with medicine. °You have a fever. °You have more redness, swelling, or pain around the puncture site. °You have fluid or blood coming from the puncture site. °Your puncture site feels warm to the touch. °You have pus or a bad smell coming from the puncture site. °Summary °After the procedure, it is common to have mild pain, tenderness, swelling, and bruising. °Follow instructions from your health care provider about how to take care of the puncture site and what activities are safe for you. °Take over-the-counter and prescription medicines only as told by your health care provider. °Contact a health care provider if you have any signs of infection, such as fluid or blood coming from the puncture site. °This information is not intended to replace advice given to you by your health care provider. Make sure you discuss any questions you have with your health care provider. °Document Revised: 09/19/2018 Document Reviewed: 09/19/2018 °Elsevier Patient Education © 2021 [employer] ° ° °Moderate Conscious Sedation, Adult, Care After °This sheet gives you information about how to care for yourself after your procedure. Your health care provider may also give you more specific instructions. If you have problems or questions, contact your health care provider. °What can I expect after the procedure? °After the procedure, it is common to have: °Sleepiness for several hours. °Impaired judgment for several hours. °Difficulty with balance. °Vomiting if you eat too   soon. °Follow these instructions at home: °For the time period you were told by your health care provider: °Rest. °Do not participate in activities where you could fall or become injured. °Do not drive or use machinery. °Do not drink alcohol. °Do not take sleeping pills or medicines that cause drowsiness. °Do not  make important decisions or sign legal documents. °Do not take care of children on your own.  °  °  °Eating and drinking °Follow the diet recommended by your health care provider. °Drink enough fluid to keep your urine pale yellow. °If you vomit: °Drink water, juice, or soup when you can drink without vomiting. °Make sure you have little or no nausea before eating solid foods.   °General instructions °Take over-the-counter and prescription medicines only as told by your health care provider. °Have a responsible adult stay with you for the time you are told. It is important to have someone help care for you until you are awake and alert. °Do not smoke. °Keep all follow-up visits as told by your health care provider. This is important. °Contact a health care provider if: °You are still sleepy or having trouble with balance after 24 hours. °You feel light-headed. °You keep feeling nauseous or you keep vomiting. °You develop a rash. °You have a fever. °You have redness or swelling around the IV site. °Get help right away if: °You have trouble breathing. °You have new-onset confusion at home. °Summary °After the procedure, it is common to feel sleepy, have impaired judgment, or feel nauseous if you eat too soon. °Rest after you get home. Know the things you should not do after the procedure. °Follow the diet recommended by your health care provider and drink enough fluid to keep your urine pale yellow. °Get help right away if you have trouble breathing or new-onset confusion at home. °This information is not intended to replace advice given to you by your health care provider. Make sure you discuss any questions you have with your health care provider. °Document Revised: 08/31/2019 Document Reviewed: 03/29/2019 °Elsevier Patient Education © 2021 Elsevier Inc.  °

## 2021-06-16 NOTE — H&P (Addendum)
Chief Complaint: Thrombocytopenia. Request is for bone marrow biopsy  Referring Physician(s): Katragadda,Sreedhar  Supervising Physician: Markus Daft  Patient Status: [facility] - Out-pt  History of Present Illness: Wyatt [name] is a 88 y.o. male 88 y.o.male inpatient. History of DM, HLD, HTN. Found to have severe thrombocytopenia. Team is requesting a bone marrow biopsy for further evaluation.  Patient alert and laying in bed, calm and comfortable Endorses lower quadrant abdominal pain X 2 months.Made worse with deep breathing, urination and coughing, Condition improved with Advil.  .Denies any fevers, headache, chest pain, SOB, cough,  nausea, vomiting or bleeding. Return precautions and treatment recommendations and follow-up discussed with the patient  who is agreeable with the plan.    Past Medical History:  Diagnosis Date   Diabetes mellitus    Hypercholesteremia    Hypertension     Past Surgical History:  Procedure Laterality Date   CARDIAC CATHETERIZATION     CHOLECYSTECTOMY N/A 06/26/2012   Procedure: LAPAROSCOPIC CHOLECYSTECTOMY;  Surgeon: Donato Heinz, MD;  Location: [facility];  Service: General;  Laterality: N/A;   COLONOSCOPY  06/10/2011   Procedure: COLONOSCOPY;  Surgeon: Rogene Houston, MD;  Location: [facility] END[facility] SU[facility];  Service: Endoscopy;  Laterality: N/A;  1030   ESOPHAGEAL DILATION N/A 07/18/2019   Procedure: ESOPHAGEAL DILATION;  Surgeon: Rogene Houston, MD;  Location: [facility];  Service: Endoscopy;  Laterality: N/A;   ESOPHAGOGASTRODUODENOSCOPY N/A 07/18/2019   Procedure: ESOPHAGOGASTRODUODENOSCOPY (EGD);  Surgeon: Rogene Houston, MD;  Location: [facility];  Service: Endoscopy;  Laterality: N/A;  215 - moved to 8:30 per Lelon Frohlich, pt is aware    Allergies: Patient has no known allergies.  Medications: Prior to Admission medications   Medication Sig Start Date End Date Taking? Authorizing Provider  aspirin EC 81 MG tablet Take 1 tablet (81 mg total) by  mouth daily. 07/25/19  Yes Rehman, Mechele Dawley, MD  famotidine (PEPCID) 20 MG tablet Take 1 tablet (20 mg total) by mouth at bedtime. 07/18/19  Yes Rehman, Mechele Dawley, MD  lisinopril (PRINIVIL,ZESTRIL) 40 MG tablet Take 40 mg by mouth daily.    Yes [provider]  metFORMIN (GLUCOPHAGE) 1000 MG tablet Take 1,000 mg by mouth 2 (two) times daily. 05/13/21  Yes [provider]  metoprolol tartrate (LOPRESSOR) 25 MG tablet Take 12.5 mg by mouth 2 (two) times daily.   Yes [provider]  pantoprazole (PROTONIX) 40 MG tablet Take 1 tablet (40 mg total) by mouth daily before breakfast. 07/18/19  Yes Rehman, Mechele Dawley, MD  simvastatin (ZOCOR) 20 MG tablet Take 20 mg by mouth daily.    Yes [provider]  ibuprofen (ADVIL) 200 MG tablet Take 2 tablets (400 mg total) by mouth every 6 (six) hours as needed. Patient not taking: Reported on 05/28/2021 07/25/19   Rogene Houston, MD  nitroGLYCERIN (NITROSTAT) 0.4 MG SL tablet Place 0.4 mg under the tongue every 5 (five) minutes as needed. For chest pain Patient not taking: Reported on 05/28/2021    [provider]     Family History  Problem Relation Age of Onset   Colon cancer Mother     Social History   Socioeconomic History   Marital status: Married    Spouse name: Not on file   Number of children: Not on file   Years of education: Not on file   Highest education level: Not on file  Occupational History   Not on file  Tobacco Use  Smoking status: Former    Packs/day: 1.00    Years: 7.00    Pack years: 7.00    Types: Cigarettes   Smokeless tobacco: Former  Scientific laboratory technician Use: Never used  Substance and Sexual Activity   Alcohol use: No   Drug use: No   Sexual activity: Yes    Birth control/protection: None  Other Topics Concern   Not on file  Social History Narrative   Not on file   Social Determinants of Health   Financial Resource Strain: Not on file  Food Insecurity: Not on file   Transportation Needs: Not on file  Physical Activity: Not on file  Stress: Not on file  Social Connections: Not on file    Review of Systems: A 12 point ROS discussed and pertinent positives are indicated in the HPI above.  All other systems are negative.  Review of Systems  Constitutional:  Negative for fever.  HENT:  Negative for congestion.   Respiratory:  Negative for cough and shortness of breath.   Cardiovascular:  Negative for chest pain.  Gastrointestinal:  Positive for abdominal distention and abdominal pain (worse with deep breathing, urination and coughing, Better with advil. X 2 months.). Negative for diarrhea, nausea and vomiting.  Neurological:  Negative for headaches.  Psychiatric/Behavioral:  Negative for behavioral problems and confusion.    Vital Signs: BP (!) 147/79    Pulse 65    Temp 98.1 F (36.7 C) (Oral)    Resp 18    SpO2 100%   Physical Exam Vitals and nursing note reviewed.  Constitutional:      Appearance: He is well-developed.  HENT:     Head: Normocephalic.  Cardiovascular:     Rate and Rhythm: Normal rate and regular rhythm.     Heart sounds: Normal heart sounds.  Pulmonary:     Effort: Pulmonary effort is normal.     Breath sounds: Normal breath sounds.  Musculoskeletal:        General: Normal range of motion.     Cervical back: Normal range of motion.  Skin:    General: Skin is dry.  Neurological:     Mental Status: He is alert and oriented to person, place, and time.    Imaging: US Abdomen Complete  Result Date: 05/26/2021 CLINICAL DATA:  Pain right upper quadrant previous studies including CT done on 2000 EXAM: ABDOMEN ULTRASOUND COMPLETE COMPARISON:  Previous studies including CT done on 06/29/2012 FINDINGS: Gallbladder: Gallbladder is not seen consistent with previous cholecystectomy. Common bile duct: Diameter: 5 mm Liver: There is increased echogenicity suggesting fatty infiltration. No focal abnormality is seen. Portal vein is  patent on color Doppler imaging with normal direction of blood flow towards the liver. IVC: No abnormality visualized. Pancreas: Not adequately visualized. Spleen: Size and appearance within normal limits. Right Kidney: Length: 10.6 cm. Echogenicity within normal limits. No mass or hydronephrosis visualized. Left Kidney: Length: 10.9 cm. There is 3.5 cm cyst in the margin of left kidney. There is no hydronephrosis. Abdominal aorta: No aneurysm visualized. Other findings: None. IMPRESSION: Status post cholecystectomy. Fatty liver. Left renal cyst. Abdominal sonogram is otherwise unremarkable. Electronically Signed   By: Elmer Picker M.D.   On: 05/26/2021 15:23    Labs:  CBC: Recent Labs    05/22/21 0912  WBC 7.0  HGB 13.3  HCT 40.3  PLT 32*    COAGS: No results for input(s): INR, APTT in the last 8760 hours.  BMP: No results for  input(s): NA, K, CL, CO2, GLUCOSE, BUN, CALCIUM, CREATININE, GFRNONAA, GFRAA in the last 8760 hours.  Invalid input(s): CMP  LIVER FUNCTION TESTS: Recent Labs    05/22/21 0912  BILITOT 0.7  AST 19  ALT 17  ALKPHOS 59  PROT 6.8  ALBUMIN 4.0     Assessment and Plan:  88 y.o.male inpatient. History of DM, HLD, HTN. Found to have severe thrombocytopenia. Team is requesting a bone marrow biopsy for further evaluation.   PLT 51.  Patient is on 81 mg of ASA. Patient states last dose was approximately 3 weeks ago.  All other labs and medications are within acceptable parameters. NKDA. Patient has been NPO since midnight.  Risks and benefits of bone marrow biopsy was discussed with the patient and/or patient's family including, but not limited to bleeding, infection, damage to adjacent structures or low yield requiring additional tests.  All of the questions were answered and there is agreement to proceed.  Consent signed and in chart.   Thank you for this interesting consult.  I greatly enjoyed meeting Wyatt [name] and look forward to  participating in their care.  A copy of this report was sent to the requesting provider on this date.  Electronically Signed: Jacqualine Mau, NP 06/16/2021, 8:13 AM   I spent a total of  30 Minutes   in face to face in clinical consultation, greater than 50% of which was counseling/coordinating care for bone marrow biopsy

## 2021-06-16 NOTE — Procedures (Signed)
Interventional Radiology Procedure:   Indications: Thrombocytopenia  Procedure: CT guided bone marrow biopsy  Findings: 2 aspirates and 1 core from left ilium  Complications: None     EBL: Minimal, less than 10 ml  Plan: Discharge to home in one hour.   Taneika Choi R. Anselm Pancoast, MD  Pager: [phone]

## 2021-06-18 ENCOUNTER — Other Ambulatory Visit (HOSPITAL_COMMUNITY): Payer: Self-pay | Admitting: Hematology

## 2021-06-18 MED ORDER — TRAMADOL HCL 50 MG PO TABS: 50.0000 mg | ORAL_TABLET | Freq: Two times a day (BID) | ORAL | 0 refills | Status: DC | PRN

## 2021-06-23 ENCOUNTER — Other Ambulatory Visit (HOSPITAL_COMMUNITY): Payer: Self-pay

## 2021-06-23 DIAGNOSIS — D696 Thrombocytopenia, unspecified: Secondary | ICD-10-CM

## 2021-06-23 DIAGNOSIS — R1031 Right lower quadrant pain: Secondary | ICD-10-CM

## 2021-06-23 NOTE — Progress Notes (Signed)
Message from Dr. Delton Coombes- Please schedule him for CT [facility] with contrast for unresolving right lower quadrant pain as soon as possible.  Thanks.

## 2021-06-27 ENCOUNTER — Inpatient Hospital Stay (HOSPITAL_COMMUNITY): Payer: Medicare HMO

## 2021-06-27 ENCOUNTER — Other Ambulatory Visit: Payer: Self-pay

## 2021-06-27 ENCOUNTER — Emergency Department (HOSPITAL_COMMUNITY): Payer: Medicare HMO

## 2021-06-27 ENCOUNTER — Inpatient Hospital Stay (HOSPITAL_COMMUNITY)
Admission: EM | Admit: 2021-06-27 | Discharge: 2021-07-03 | DRG: 086 | Disposition: A | Discharge status: Home Health | Payer: Medicare HMO | Attending: Family Medicine | Admitting: Family Medicine

## 2021-06-27 ENCOUNTER — Encounter (HOSPITAL_COMMUNITY): Payer: Self-pay | Admitting: Emergency Medicine

## 2021-06-27 DIAGNOSIS — Z800 Family history of malignant neoplasm of digestive organs: Secondary | ICD-10-CM | POA: Diagnosis not present

## 2021-06-27 DIAGNOSIS — R52 Pain, unspecified: Secondary | ICD-10-CM | POA: Diagnosis not present

## 2021-06-27 DIAGNOSIS — R402252 Coma scale, best verbal response, oriented, at arrival to emergency department: Secondary | ICD-10-CM | POA: Diagnosis present

## 2021-06-27 DIAGNOSIS — E119 Type 2 diabetes mellitus without complications: Secondary | ICD-10-CM | POA: Diagnosis present

## 2021-06-27 DIAGNOSIS — C251 Malignant neoplasm of body of pancreas: Secondary | ICD-10-CM | POA: Diagnosis present

## 2021-06-27 DIAGNOSIS — I82890 Acute embolism and thrombosis of other specified veins: Secondary | ICD-10-CM | POA: Diagnosis present

## 2021-06-27 DIAGNOSIS — E222 Syndrome of inappropriate secretion of antidiuretic hormone: Secondary | ICD-10-CM | POA: Diagnosis not present

## 2021-06-27 DIAGNOSIS — S065XAA Traumatic subdural hemorrhage with loss of consciousness status unknown, initial encounter: Secondary | ICD-10-CM | POA: Diagnosis not present

## 2021-06-27 DIAGNOSIS — R402362 Coma scale, best motor response, obeys commands, at arrival to emergency department: Secondary | ICD-10-CM | POA: Diagnosis present

## 2021-06-27 DIAGNOSIS — I10 Essential (primary) hypertension: Secondary | ICD-10-CM | POA: Diagnosis present

## 2021-06-27 DIAGNOSIS — D696 Thrombocytopenia, unspecified: Secondary | ICD-10-CM | POA: Diagnosis present

## 2021-06-27 DIAGNOSIS — C259 Malignant neoplasm of pancreas, unspecified: Secondary | ICD-10-CM | POA: Diagnosis present

## 2021-06-27 DIAGNOSIS — R55 Syncope and collapse: Secondary | ICD-10-CM | POA: Diagnosis present

## 2021-06-27 DIAGNOSIS — M545 Low back pain, unspecified: Secondary | ICD-10-CM | POA: Diagnosis not present

## 2021-06-27 DIAGNOSIS — I672 Cerebral atherosclerosis: Secondary | ICD-10-CM | POA: Diagnosis not present

## 2021-06-27 DIAGNOSIS — H9190 Unspecified hearing loss, unspecified ear: Secondary | ICD-10-CM | POA: Diagnosis present

## 2021-06-27 DIAGNOSIS — E785 Hyperlipidemia, unspecified: Secondary | ICD-10-CM | POA: Diagnosis present

## 2021-06-27 DIAGNOSIS — M50323 Other cervical disc degeneration at C6-C7 level: Secondary | ICD-10-CM | POA: Diagnosis not present

## 2021-06-27 DIAGNOSIS — Z87891 Personal history of nicotine dependence: Secondary | ICD-10-CM

## 2021-06-27 DIAGNOSIS — W19XXXA Unspecified fall, initial encounter: Secondary | ICD-10-CM | POA: Diagnosis not present

## 2021-06-27 DIAGNOSIS — Z7982 Long term (current) use of aspirin: Secondary | ICD-10-CM | POA: Diagnosis not present

## 2021-06-27 DIAGNOSIS — R296 Repeated falls: Secondary | ICD-10-CM | POA: Diagnosis present

## 2021-06-27 DIAGNOSIS — E7800 Pure hypercholesterolemia, unspecified: Secondary | ICD-10-CM | POA: Diagnosis present

## 2021-06-27 DIAGNOSIS — R402142 Coma scale, eyes open, spontaneous, at arrival to emergency department: Secondary | ICD-10-CM | POA: Diagnosis present

## 2021-06-27 DIAGNOSIS — W1839XA Other fall on same level, initial encounter: Secondary | ICD-10-CM | POA: Diagnosis present

## 2021-06-27 DIAGNOSIS — Z043 Encounter for examination and observation following other accident: Secondary | ICD-10-CM | POA: Diagnosis not present

## 2021-06-27 DIAGNOSIS — M25551 Pain in right hip: Secondary | ICD-10-CM

## 2021-06-27 DIAGNOSIS — M542 Cervicalgia: Secondary | ICD-10-CM | POA: Diagnosis not present

## 2021-06-27 DIAGNOSIS — Z20822 Contact with and (suspected) exposure to covid-19: Secondary | ICD-10-CM | POA: Diagnosis not present

## 2021-06-27 DIAGNOSIS — R42 Dizziness and giddiness: Secondary | ICD-10-CM | POA: Diagnosis not present

## 2021-06-27 DIAGNOSIS — S065X0A Traumatic subdural hemorrhage without loss of consciousness, initial encounter: Principal | ICD-10-CM | POA: Diagnosis present

## 2021-06-27 DIAGNOSIS — I6200 Nontraumatic subdural hemorrhage, unspecified: Secondary | ICD-10-CM | POA: Diagnosis not present

## 2021-06-27 DIAGNOSIS — Y92019 Unspecified place in single-family (private) house as the place of occurrence of the external cause: Secondary | ICD-10-CM

## 2021-06-27 DIAGNOSIS — G8929 Other chronic pain: Secondary | ICD-10-CM | POA: Diagnosis present

## 2021-06-27 DIAGNOSIS — Z79899 Other long term (current) drug therapy: Secondary | ICD-10-CM

## 2021-06-27 DIAGNOSIS — I2510 Atherosclerotic heart disease of native coronary artery without angina pectoris: Secondary | ICD-10-CM | POA: Diagnosis present

## 2021-06-27 DIAGNOSIS — E871 Hypo-osmolality and hyponatremia: Secondary | ICD-10-CM | POA: Diagnosis not present

## 2021-06-27 DIAGNOSIS — M1611 Unilateral primary osteoarthritis, right hip: Secondary | ICD-10-CM | POA: Diagnosis not present

## 2021-06-27 DIAGNOSIS — E860 Dehydration: Secondary | ICD-10-CM | POA: Diagnosis present

## 2021-06-27 DIAGNOSIS — Z7984 Long term (current) use of oral hypoglycemic drugs: Secondary | ICD-10-CM | POA: Diagnosis not present

## 2021-06-27 DIAGNOSIS — I6201 Nontraumatic acute subdural hemorrhage: Secondary | ICD-10-CM | POA: Diagnosis not present

## 2021-06-27 DIAGNOSIS — R1031 Right lower quadrant pain: Secondary | ICD-10-CM | POA: Diagnosis not present

## 2021-06-27 DIAGNOSIS — Z9049 Acquired absence of other specified parts of digestive tract: Secondary | ICD-10-CM

## 2021-06-27 DIAGNOSIS — I700 Atherosclerosis of aorta: Secondary | ICD-10-CM | POA: Diagnosis not present

## 2021-06-27 DIAGNOSIS — M50322 Other cervical disc degeneration at C5-C6 level: Secondary | ICD-10-CM | POA: Diagnosis not present

## 2021-06-27 LAB — COMPREHENSIVE METABOLIC PANEL
ALT: 20 U/L (ref 0–44)
AST: 19 U/L (ref 15–41)
Albumin: 3.6 g/dL (ref 3.5–5.0)
Alkaline Phosphatase: 62 U/L (ref 38–126)
Anion gap: 7 (ref 5–15)
BUN: 10 mg/dL (ref 8–23)
CO2: 29 mmol/L (ref 22–32)
Calcium: 8.9 mg/dL (ref 8.9–10.3)
Chloride: 94 mmol/L — ABNORMAL LOW (ref 98–111)
Creatinine, Ser: 0.6 mg/dL — ABNORMAL LOW (ref 0.61–1.24)
GFR, Estimated: >60 mL/min (ref >60)
Glucose, Bld: 201 mg/dL — ABNORMAL HIGH (ref 70–99)
Potassium: 4.5 mmol/L (ref 3.5–5.1)
Sodium: 130 mmol/L — ABNORMAL LOW (ref 135–145)
Total Bilirubin: 1.1 mg/dL (ref 0.3–1.2)
Total Protein: 6.3 g/dL — ABNORMAL LOW (ref 6.5–8.1)

## 2021-06-27 LAB — CBC WITH DIFFERENTIAL/PLATELET
Abs Immature Granulocytes: 0.03 10*3/uL (ref 0.00–0.07)
Basophils Absolute: 0 10*3/uL (ref 0.0–0.1)
Basophils Relative: 0 %
Eosinophils Absolute: 0.1 10*3/uL (ref 0.0–0.5)
Eosinophils Relative: 1 %
HCT: 38 % — ABNORMAL LOW (ref 39.0–52.0)
Hemoglobin: 12.7 g/dL — ABNORMAL LOW (ref 13.0–17.0)
Immature Granulocytes: 0 %
Lymphocytes Relative: 9 %
Lymphs Abs: 0.7 10*3/uL (ref 0.7–4.0)
MCH: 28.6 pg (ref 26.0–34.0)
MCHC: 33.4 g/dL (ref 30.0–36.0)
MCV: 85.6 fL (ref 80.0–100.0)
Monocytes Absolute: 0.6 10*3/uL (ref 0.1–1.0)
Monocytes Relative: 7 %
Neutro Abs: 6.1 10*3/uL (ref 1.7–7.7)
Neutrophils Relative %: 83 %
Platelets: 50 10*3/uL — ABNORMAL LOW (ref 150–400)
RBC: 4.44 MIL/uL (ref 4.22–5.81)
RDW: 12.7 % (ref 11.5–15.5)
WBC: 7.5 10*3/uL (ref 4.0–10.5)
nRBC: 0 % (ref 0.0–0.2)

## 2021-06-27 LAB — TROPONIN I (HIGH SENSITIVITY)
Troponin I (High Sensitivity): 3 ng/L (ref <18)
Troponin I (High Sensitivity): 3 ng/L (ref <18)

## 2021-06-27 LAB — RESP PANEL BY RT-PCR (FLU A&B, COVID) ARPGX2
Influenza A by PCR: NEGATIVE
Influenza B by PCR: NEGATIVE
SARS Coronavirus 2 by RT PCR: NEGATIVE

## 2021-06-27 LAB — CBG MONITORING, ED
Glucose-Capillary: 208 mg/dL — ABNORMAL HIGH (ref 70–99)
Glucose-Capillary: 190 mg/dL — ABNORMAL HIGH (ref 70–99)

## 2021-06-27 MED ORDER — SODIUM CHLORIDE 0.9% FLUSH
3.0000 mL | Freq: Two times a day (BID) | INTRAVENOUS | Status: DC
  Administered 2021-06-27 – 2021-07-03 (×10): 3 mL via INTRAVENOUS

## 2021-06-27 MED ORDER — INSULIN ASPART 100 UNIT/ML IJ SOLN
0.0000 [IU] | Freq: Three times a day (TID) | INTRAMUSCULAR | Status: DC
  Administered 2021-06-27: 2 [IU] via SUBCUTANEOUS
  Administered 2021-06-28: 5 [IU] via SUBCUTANEOUS
  Administered 2021-06-28: 3 [IU] via SUBCUTANEOUS
  Administered 2021-06-28: 5 [IU] via SUBCUTANEOUS
  Administered 2021-06-29: 3 [IU] via SUBCUTANEOUS
  Administered 2021-06-29: 7 [IU] via SUBCUTANEOUS
  Administered 2021-06-29: 3 [IU] via SUBCUTANEOUS
  Administered 2021-06-30 (×2): 2 [IU] via SUBCUTANEOUS
  Administered 2021-06-30: 5 [IU] via SUBCUTANEOUS
  Administered 2021-07-01: 2 [IU] via SUBCUTANEOUS
  Administered 2021-07-01 – 2021-07-02 (×3): 3 [IU] via SUBCUTANEOUS
  Administered 2021-07-02 (×2): 2 [IU] via SUBCUTANEOUS
  Administered 2021-07-03: 3 [IU] via SUBCUTANEOUS
  Administered 2021-07-03: 2 [IU] via SUBCUTANEOUS
  Filled 2021-06-27: qty 1

## 2021-06-27 MED ORDER — ACETAMINOPHEN 650 MG RE SUPP: 650.0000 mg | Freq: Four times a day (QID) | RECTAL | Status: DC | PRN

## 2021-06-27 MED ORDER — SODIUM CHLORIDE 0.9% FLUSH
3.0000 mL | Freq: Two times a day (BID) | INTRAVENOUS | Status: DC
Start: 2021-06-27 — End: 2021-07-03
  Administered 2021-06-27 – 2021-07-02 (×7): 3 mL via INTRAVENOUS

## 2021-06-27 MED ORDER — ONDANSETRON HCL 4 MG PO TABS: 4.0000 mg | ORAL_TABLET | Freq: Four times a day (QID) | ORAL | Status: DC | PRN

## 2021-06-27 MED ORDER — IOHEXOL 300 MG/ML  SOLN
100.0000 mL | Freq: Once | INTRAMUSCULAR | Status: AC | PRN
  Administered 2021-06-27: 100 mL via INTRAVENOUS

## 2021-06-27 MED ORDER — SIMVASTATIN 20 MG PO TABS
20.0000 mg | ORAL_TABLET | Freq: Every day | ORAL | Status: DC
  Administered 2021-06-27 – 2021-07-03 (×7): 20 mg via ORAL
  Filled 2021-06-27 (×2): qty 1
  Filled 2021-06-27: qty 2
  Filled 2021-06-27 (×4): qty 1

## 2021-06-27 MED ORDER — ACETAMINOPHEN 325 MG PO TABS
650.0000 mg | ORAL_TABLET | Freq: Four times a day (QID) | ORAL | Status: DC | PRN
  Administered 2021-06-27 – 2021-07-02 (×6): 650 mg via ORAL
  Filled 2021-06-27 (×6): qty 2

## 2021-06-27 MED ORDER — SODIUM CHLORIDE 0.9 % IV SOLN
20.0000 ug | Freq: Once | INTRAVENOUS | Status: AC
  Administered 2021-06-27: 20 ug via INTRAVENOUS
  Filled 2021-06-27: qty 5

## 2021-06-27 MED ORDER — POLYETHYLENE GLYCOL 3350 17 G PO PACK: 17.0000 g | PACK | Freq: Every day | ORAL | Status: DC | PRN

## 2021-06-27 MED ORDER — DEXAMETHASONE 4 MG PO TABS
40.0000 mg | ORAL_TABLET | Freq: Every day | ORAL | Status: AC
  Administered 2021-06-27 – 2021-06-30 (×4): 40 mg via ORAL
  Filled 2021-06-27 (×4): qty 10

## 2021-06-27 MED ORDER — SODIUM CHLORIDE 0.9 % IV SOLN: 250.0000 mL | INTRAVENOUS | Status: DC | PRN

## 2021-06-27 MED ORDER — PANTOPRAZOLE SODIUM 40 MG IV SOLR
40.0000 mg | Freq: Every day | INTRAVENOUS | Status: DC
  Administered 2021-06-28: 40 mg via INTRAVENOUS
  Filled 2021-06-27: qty 10

## 2021-06-27 MED ORDER — SODIUM CHLORIDE 0.9% FLUSH: 3.0000 mL | INTRAVENOUS | Status: DC | PRN

## 2021-06-27 MED ORDER — ONDANSETRON HCL 4 MG/2ML IJ SOLN: 4.0000 mg | Freq: Four times a day (QID) | INTRAMUSCULAR | Status: DC | PRN

## 2021-06-27 NOTE — ED Triage Notes (Addendum)
Patient brought in via EMS from home. Alert and oriented. Airway patent. Per paramedic patient fell this morning upon standing and going to the bathroom. Per patient became dizzy and had LOC upon standing this morning. Patient reports waking up in the bed with wife and son around him. Unsure how long he had LOC. Patient denies dizziness or headache at this time. Patient does c/o neck pain. Per paramedic was reported he hit head with fall. Denies taking any type of anticoagulant. Patient recently stopped taking aspirin a month and half ago due to doctors recommendation. Patient c/o abd pain in which he is to have CT on Tuesday for. Denies any nausea or vomiting. Denies any neurological deficits, no facial drooping, or slurred speech.

## 2021-06-27 NOTE — H&P (Signed)
History and Physical    Patient: Wyatt [name] TKW:[id number] DOB: 1933-09-21 DOA: 06/27/2021 DOS: the patient was seen and examined on 06/27/2021 PCP: Asencion Noble, MD  Patient coming from: Home  Chief Complaint:  Chief Complaint  Patient presents with   Fall    HPI: Wyatt [name] is a 88 y.o. male with medical history significant of DM, HTN, HLD, CAD, thrombocytopenia who presents after a syncopal episode at home.  Patient states that he has had right upper quadrant abdominal pain for about 3 months.  Yesterday he was at his baseline health.  This morning, he got out of bed and was walking to the restroom when he passed out.  He states that he was out for minutes.  He denies any recent illnesses, no chest pain, shortness of breath, nausea or vomiting.  Denies any gross blood loss from his thrombocytopenia.  He had recently undergone bone marrow biopsy at the end of January for thrombocytopenia work-up.  Review of Systems: As mentioned in the history of present illness. All other systems reviewed and are negative. Past Medical History:  Diagnosis Date   Diabetes mellitus    Hypercholesteremia    Hypertension    Past Surgical History:  Procedure Laterality Date   CARDIAC CATHETERIZATION     CHOLECYSTECTOMY N/A 06/26/2012   Procedure: LAPAROSCOPIC CHOLECYSTECTOMY;  Surgeon: Donato Heinz, MD;  Location: [facility];  Service: General;  Laterality: N/A;   COLONOSCOPY  06/10/2011   Procedure: COLONOSCOPY;  Surgeon: Rogene Houston, MD;  Location: [facility];  Service: Endoscopy;  Laterality: N/A;  1030   ESOPHAGEAL DILATION N/A 07/18/2019   Procedure: ESOPHAGEAL DILATION;  Surgeon: Rogene Houston, MD;  Location: [facility];  Service: Endoscopy;  Laterality: N/A;   ESOPHAGOGASTRODUODENOSCOPY N/A 07/18/2019   Procedure: ESOPHAGOGASTRODUODENOSCOPY (EGD);  Surgeon: Rogene Houston, MD;  Location[facility];  Service: Endoscopy;  Laterality: N/A;  215 - moved to 8:30 per Lelon Frohlich, pt is aware    Social History:  reports that he has quit smoking. He has a 7.00 pack-year smoking history. He has quit using smokeless tobacco. He reports that he does not drink alcohol and does not use drugs.  No Known Allergies  Family History  Problem Relation Age of Onset   Colon cancer Mother     Prior to Admission medications   Medication Sig Start Date End Date Taking? Authorizing Provider  aspirin EC 81 MG tablet Take 1 tablet (81 mg total) by mouth daily. 07/25/19   Rogene Houston, MD  famotidine (PEPCID) 20 MG tablet Take 1 tablet (20 mg total) by mouth at bedtime. 07/18/19   Rehman, Mechele Dawley, MD  ibuprofen (ADVIL) 200 MG tablet Take 2 tablets (400 mg total) by mouth every 6 (six) hours as needed. Patient not taking: Reported on 05/28/2021 07/25/19   Rogene Houston, MD  lisinopril (PRINIVIL,ZESTRIL) 40 MG tablet Take 40 mg by mouth daily.     [provider]  metFORMIN (GLUCOPHAGE) 1000 MG tablet Take 1,000 mg by mouth 2 (two) times daily. 05/13/21   [provider]  metoprolol tartrate (LOPRESSOR) 25 MG tablet Take 12.5 mg by mouth 2 (two) times daily.    [provider]  nitroGLYCERIN (NITROSTAT) 0.4 MG SL tablet Place 0.4 mg under the tongue every 5 (five) minutes as needed. For chest pain Patient not taking: Reported on 05/28/2021    [provider]  pantoprazole (PROTONIX) 40 MG tablet Take 1 tablet (40 mg  total) by mouth daily before breakfast. 07/18/19   Rehman, Mechele Dawley, MD  simvastatin (ZOCOR) 20 MG tablet Take 20 mg by mouth daily.     [provider]  traMADol (ULTRAM) 50 MG tablet Take 1 tablet (50 mg total) by mouth 2 (two) times daily as needed. 06/18/21   Derek Jack, MD    Physical Exam: Vitals:   06/27/21 1030 06/27/21 1100 06/27/21 1230 06/27/21 1300  BP: 124/80 133/74 128/77 137/70  Pulse: 75 72 81 80  Resp: $Remo'17 17 17 19  'OYYDJ$ Temp:      TempSrc:      SpO2: 94% 98% 92% 96%  Weight:      Height:        Examination: General exam: Appears calm and comfortable, hard of hearing Respiratory system: Clear to auscultation. Respiratory effort normal. Cardiovascular system: S1 & S2 heard, RRR. No pedal edema. Gastrointestinal system: Abdomen is nondistended, soft and tender to palpation right upper quadrant Central nervous system: Alert and oriented. Non focal exam.  Cranial nerve II through XII grossly intact.  Strength 5 out of 5 all extremities.  Speech clear  Extremities: Symmetric in appearance bilaterally  Skin: No rashes, lesions or ulcers on exposed skin  Psychiatry: Judgement and insight appear stable. Mood & affect appropriate.    Data Reviewed:  Troponin negative, platelet 50, CT head, cervical spine, abd/pelvis reviewed and pertinent results in A/P   Assessment and Plan: * Subdural hematoma- (present on admission) S/p syncope at home. CT head: Acute right parietal subdural hematoma with maximum thickness of 6 mm. Mild mass effect upon the underlying cerebral hemisphere with approximately 2 mm of midline shift. 2. Tiny left subdural hematoma overlying the inferior left parietal lobe also noted. Neurosurgery consulted, no acute surgical intervention planned but due to his chronic thrombocytopenia, he will need close monitoring at [facility] and repeat CT head tomorrow  Neurochecks  Syncope, vasovagal- (present on admission) Fall precaution, orthostatic VS   Thrombocytopenia ([place])- (present on admission) Followed by Dr. Delton Coombes S/p bone marrow biopsy Jan 2023  EDP discussed with Dr. Delton Coombes over the phone, recommended to start Decadron 40 mg daily for thrombocytopenia as well as Protonix for GI protection.  Pancreatic adenocarcinoma ([place])- (present on admission) New finding on CT A/P: Ill-defined low-density mass at the body with upstream pancreatic atrophy, mass roughly 3.1 cm in length. The mass infiltrates the peripancreatic fat, especially inferiorly.  Associated splenic vein occlusion with collaterals. Patient followed by Dr. Delton Coombes for thrombocytopenia.  EDP discussed with Dr. Delton Coombes over the phone, he will arrange for close follow-up upon patient's discharge from the hospital  Coronary atherosclerosis- (present on admission) Hold aspirin in setting of SDH and thrombocytopenia  Essential hypertension- (present on admission) Hold lisinopril and lopressor in setting of syncope  HLD (hyperlipidemia)- (present on admission) Zocor   DM (diabetes mellitus) ([place]) Hold metformin SSI        Advance Care Planning:   Code Status: Not on file Full code   Consults: Neurosurgery, oncology   Family Communication: None at bedside   Severity of Illness: The appropriate patient status for this patient is INPATIENT. Inpatient status is judged to be reasonable and necessary in order to provide the required intensity of service to ensure the patient's safety. The patient's presenting symptoms, physical exam findings, and initial radiographic and laboratory data in the context of their chronic comorbidities is felt to place them at high risk for further clinical deterioration. Furthermore, it is not anticipated that  the patient will be medically stable for discharge from the hospital within 2 midnights of admission.   * I certify that at the point of admission it is my clinical judgment that the patient will require inpatient hospital care spanning beyond 2 midnights from the point of admission due to high intensity of service, high risk for further deterioration and high frequency of surveillance required.*  Author: Dessa Phi, DO 06/27/2021 1:54 PM  For on call review [email or web address].

## 2021-06-27 NOTE — Assessment & Plan Note (Addendum)
Continue simvastatin. LFTs wnl.

## 2021-06-27 NOTE — ED Notes (Signed)
C-collar placed on patient. Wife now in the room. Per wife patient has had generalized weakness that has progressively gotten worse, in which he has had labs, bone marrow biopsy for recently. Patient also recently had ultrasound for abd pain with the CT scheduled on Tuesday. Wife states fall was not witnessed but heard and that patient was the one that got into bed by himself. Wife states LOC was brief.

## 2021-06-27 NOTE — ED Notes (Signed)
C-collar removed per EDPa's approval.

## 2021-06-27 NOTE — ED Provider Notes (Signed)
[facility] Provider Note   CSN: [id number] Arrival date & time: 06/27/21  5638     History  Chief Complaint  Patient presents with   Wyatt [name] is a 88 y.o. male who has history of recurrent falls in the last few months who presents after syncopal episode this morning with associated fall.  Patient states that he got up quickly out of bed to go to the bathroom and felt very lightheaded and then syncopized.  His wife states that he was only unconscious for a few seconds but laid on the floor to reorient himself for few minutes.  He denies any nausea, vomiting, blurry or double vision since that time.  He does complain of neck pain. Additionally continues to complain about  severe right-sided abdominal pain that radiates to his hip.  This is been ongoing for approximately 5 months.  He does have a CT of the abdomen and pelvis with contrast scheduled for 2/14 for unresolving right lower quadrant pain. Denies any chest pain, shortness of breath, or palpitations at this time.  He used to be on aspirin but recently stopped taking this after recurrent falls.  He is not anticoagulated.  I personally reviewed his medical records.  He has history of hypertension, hypercholesterolemia, and diabetes mellitus.  HPI     Home Medications Prior to Admission medications   Medication Sig Start Date End Date Taking? Authorizing Provider  aspirin EC 81 MG tablet Take 1 tablet (81 mg total) by mouth daily. 07/25/19   Rogene Houston, MD  famotidine (PEPCID) 20 MG tablet Take 1 tablet (20 mg total) by mouth at bedtime. 07/18/19   Rehman, Mechele Dawley, MD  ibuprofen (ADVIL) 200 MG tablet Take 2 tablets (400 mg total) by mouth every 6 (six) hours as needed. Patient not taking: Reported on 05/28/2021 07/25/19   Rogene Houston, MD  lisinopril (PRINIVIL,ZESTRIL) 40 MG tablet Take 40 mg by mouth daily.     [provider]  metFORMIN (GLUCOPHAGE) 1000 MG tablet Take 1,000 mg  by mouth 2 (two) times daily. 05/13/21   [provider]  metoprolol tartrate (LOPRESSOR) 25 MG tablet Take 12.5 mg by mouth 2 (two) times daily.    [provider]  nitroGLYCERIN (NITROSTAT) 0.4 MG SL tablet Place 0.4 mg under the tongue every 5 (five) minutes as needed. For chest pain Patient not taking: Reported on 05/28/2021    [provider]  pantoprazole (PROTONIX) 40 MG tablet Take 1 tablet (40 mg total) by mouth daily before breakfast. 07/18/19   Rehman, Mechele Dawley, MD  simvastatin (ZOCOR) 20 MG tablet Take 20 mg by mouth daily.     [provider]  traMADol (ULTRAM) 50 MG tablet Take 1 tablet (50 mg total) by mouth 2 (two) times daily as needed. 06/18/21   Derek Jack, MD      Allergies    Patient has no known allergies.    Review of Systems   Review of Systems  Constitutional: Negative.   HENT: Negative.    Eyes: Negative.   Respiratory: Negative.    Cardiovascular: Negative.   Gastrointestinal:  Positive for abdominal pain.  Genitourinary: Negative.   Musculoskeletal:  Positive for neck pain.  Skin: Negative.   Neurological:  Positive for syncope.  Hematological: Negative.    Physical Exam Updated Vital Signs BP 133/81 (BP Location: Left Arm)    Pulse 82    Temp 98 F (36.7 C) (Oral)  Resp 18    Ht 6' (1.829 m)    Wt 64.9 kg    SpO2 97%    BMI 19.39 kg/m  Physical Exam Vitals and nursing note reviewed.  Constitutional:      Appearance: He is not ill-appearing or toxic-appearing.     Interventions: Cervical collar in place.  HENT:     Head: Normocephalic and atraumatic.     Nose: Nose normal.     Mouth/Throat:     Mouth: Mucous membranes are moist.     Pharynx: Oropharynx is clear. Uvula midline. No oropharyngeal exudate or posterior oropharyngeal erythema.     Tonsils: No tonsillar exudate.  Eyes:     General: Lids are normal. Vision grossly intact.        Right eye: No discharge.        Left eye: No discharge.      Extraocular Movements: Extraocular movements intact.     Conjunctiva/sclera: Conjunctivae normal.     Pupils: Pupils are equal, round, and reactive to light.  Neck:     Trachea: Trachea and phonation normal.  Cardiovascular:     Rate and Rhythm: Normal rate and regular rhythm.     Pulses: Normal pulses.     Heart sounds: Normal heart sounds. No murmur heard. Pulmonary:     Effort: Pulmonary effort is normal. No tachypnea, bradypnea, accessory muscle usage, prolonged expiration or respiratory distress.     Breath sounds: Normal breath sounds. No wheezing or rales.  Chest:     Chest wall: No mass, lacerations, deformity, swelling, crepitus or edema.  Abdominal:     General: Bowel sounds are normal. There is no distension.     Palpations: Abdomen is soft.     Tenderness: There is abdominal tenderness in the right upper quadrant and right lower quadrant. There is no right CVA tenderness, left CVA tenderness, guarding or rebound.  Musculoskeletal:        General: No deformity.     Cervical back: Normal range of motion and neck supple. No tenderness.     Right lower leg: No edema.     Left lower leg: No edema.     Comments: TTP over right hip, though patient freely moving the leg and moving around in the bed without difficulty. Normal DP pulses bilaterally.   Lymphadenopathy:     Cervical: No cervical adenopathy.  Skin:    General: Skin is warm and dry.     Capillary Refill: Capillary refill takes less than 2 seconds.  Neurological:     General: No focal deficit present.     Mental Status: He is alert and oriented to person, place, and time. Mental status is at baseline.     GCS: GCS eye subscore is 4. GCS verbal subscore is 5. GCS motor subscore is 6.     Cranial Nerves: Cranial nerves 2-12 are intact.     Sensory: Sensation is intact.     Motor: Motor function is intact.  Psychiatric:        Mood and Affect: Mood normal.    ED Results / Procedures / Treatments   Labs (all labs  ordered are listed, but only abnormal results are displayed) Labs Reviewed  CBG MONITORING, ED - Abnormal; Notable for the following components:      Result Value   Glucose-Capillary 208 (*)    All other components within normal limits    EKG None  Radiology No results found.  Procedures .Critical Care Performed by:  Shaquoia Miers, Gypsy Balsam, PA-C Authorized by: Emeline Darling, PA-C   Critical care provider statement:    Critical care time (minutes):  45   Critical care was time spent personally by me on the following activities:  Development of treatment plan with patient or surrogate, discussions with consultants, evaluation of patient's response to treatment, examination of patient, obtaining history from patient or surrogate, ordering and performing treatments and interventions, ordering and review of laboratory studies, ordering and review of radiographic studies, pulse oximetry and re-evaluation of patient's condition    Medications Ordered in ED Medications - No data to display  ED Course/ Medical Decision Making/ A&P Clinical Course as of 06/27/21 1505  Sat Jun 27, 2021  1120 Critical result called from radiologist, Dr. Clovis Riley, regarding head CT which revealed small bilateral subdural hematomas, 6 mm in thickness on the righ, and 2 mm in thickness on the left.  I appreciate his collaboration in the care of this patient. [RS]  1133 Issue with radiology results crossing over into epic.  I personally reviewed report from patient's CT head and C-spine in PACS.  Acute right parietal subdural hematoma with maximum  thickness of 6 mm with 2 mm of midline shift.  Additional tiny left subdural hematoma in the parietal lobe.  No acute cervical abnormality. [RS]  1202 Consult to neurosurgeon, Dr. Arnoldo Morale, who states that subdurals of the size are typically not an issue, however patient has significant thrombocytopenia with platelets of only 50 therefore he feels patient would benefit  from admission to the hospital for further monitoring and repeat CT scan tomorrow.  He is requesting transfer to [facility] for medical admission.  He is requesting medicine to call him once the patient arrives.  I appreciate his collaboration in the care of this patient. [RS]  1211 Consult to pharmacist regarding appropriate dosing for DDAVP in context of subdural hemorrhage and thrombocytopenia.  He recommends 20 mcg IV at this time.  Appreciate collaboration of care with patient. [RS]  0272 Consult to hospitalist, Dr. Maylene Roes, who is agreeable to admitting this patient to [facility].  I appreciate her collaboration in the care of this patient. [RS]  1331 Consult to Dr. Delton Coombes, oncologist who initially ordered patient's outpatient CT for this upcoming week.  He recommends starting Decadron 40 mg p.o. once daily for the next 4 days as well as protonix 40 mg daily. I appreciate his collaboration in the care of this patient. [RS]    Clinical Course User Index [RS] Emeline Darling, PA-C                           Medical Decision Making 88 year old male presents with concern for neck pain after syncopal episode today with fall.  The differential for syncope is extensive and includes, but is not limited to: arrythmia (Vtach, SVT, SSS, sinus arrest, AV block, bradycardia), aortic stenosis, AMI, hypertrophic obstructive cardiomyopathy (HOCM), PE, atrial myxoma, pulmonary hypertension, orthostatic hypotension, hypovolemia, drug effect, GB syndrome, micturition, cough, carotid sinus sensitivity, seizure, TIA/CVA, hypoglycemia, vertigo.  Vital signs otherwise normal.  Cardiopulmonary exam is unremarkable, abdominal exam with exquisite tenderness palpation on the right lower quadrant.  Patient is in the C-spine.  Neurovascular intact in all 4 extremities with normal brief neurologic exam.   Amount and/or Complexity of Data Reviewed Labs: ordered.    Details: CBC with mild anemia  with hemoglobin of 12.7 at patient's baseline of 13.  Thrombocytopenia with  platelets of 50, patient with chronically low platelets.  CMP with mild hyponatremia of 130, otherwise unremarkable.  Troponin is normal, 3. Radiology: ordered.    Details: CT of the head with bilateral parietal subdural hematomas, right greater than left.  CT of the C-spine unremarkable.  Plain film of the hips and pelvis negative.  Chest x-ray negative for acute cardiopulmonary disease.  CT of the abdomen pelvis obtained due to ongoing chronic right lower abdominal pain with associated 30 pound weight loss over the last few months.  Findings unfortunately consistent with metastatic pancreatic malignancy with peritoneal involvement. ECG/medicine tests: independent interpretation performed.    Details: EKG with sinus rhythm without STEMI. Discussion of management or test interpretation with external provider(s): Case discussed with neurosurgery Dr. Arnoldo Morale, oncology Dr. Delton Coombes, and hospital medicine Dr. Maylene Roes.  Risk Prescription drug management. Decision regarding hospitalization.   Syncopal episode most consistent with vagal episode as it occurred immediately after standing from bed first thing in the morning.  Cardiac work-up is reassuring.  Overall clinical picture with multiple indications for admission to the hospital at this time.  Plan for patient to be admitted to [facility] voiced understanding of his medical evaluation and treatment plan.  Each of his questions was answered to his expressed satisfaction.  He is amenable to plan for admission at this time.  This chart was dictated using voice recognition software, Dragon. Despite the best efforts of this provider to proofread and correct errors, errors may still occur which can change documentation meaning.  Final Clinical Impression(s) / ED Diagnoses Final diagnoses:  None    Rx / DC Orders ED Discharge Orders     None          Aura Dials 06/27/21 1509    Fredia Sorrow, MD 06/28/21 [phone]

## 2021-06-27 NOTE — Assessment & Plan Note (Addendum)
HbA1c 7.5%. Will continue to hold metformin - SSI.

## 2021-06-27 NOTE — Consult Note (Signed)
The patient is an 88 year old white male with a history of thrombocytopenia who took a fall.  He was taken to [facility] where a head CT demonstrated small bilateral acute subdural hematomas.  His platelet count is 50,000.  I was contacted by the ER.  I spoke with his PA.  I recommended he be transferred to [facility] medical service for further observation in light of his thrombocytopenia.  I do not think it is likely that he will need surgery.  I will plan to repeat his CT scan tomorrow.

## 2021-06-27 NOTE — ED Notes (Signed)
Report given to [facility]  

## 2021-06-27 NOTE — Assessment & Plan Note (Addendum)
New finding on CT A/P: Ill-defined low-density mass at the body with upstream pancreatic atrophy, mass roughly 3.1 cm in length. The mass infiltrates the peripancreatic fat, especially inferiorly. Associated splenic vein occlusion with collaterals. - Not biopsy-proven yet, though tumor marker severely elevated. IR was consulted and will perform omental biopsy as an outpatient.  - Dr. Delton Coombes aware of admission. Had follow up scheduled this afternoon which the patient will not make. I called to make Dr. Delton Coombes aware of need to reschedule, no answer, left secure chat as well, waiting to hear back.

## 2021-06-27 NOTE — Assessment & Plan Note (Addendum)
Hold aspirin in setting of SDH and thrombocytopenia. Pt denies any chest pain or sob.

## 2021-06-27 NOTE — Progress Notes (Signed)
Patient arrived to room [facility].  Assessment complete, VS obtained, and Admission database began.

## 2021-06-27 NOTE — Assessment & Plan Note (Addendum)
S/p syncope at home. CT head: Acute right parietal subdural hematoma with maximum thickness of 6 mm. Mild mass effect upon the underlying cerebral hemisphere with approximately 2 mm of midline shift. Tiny left subdural hematoma overlying the inferior left parietal lobe also noted. Repeat CT demonstrated stable size and appearance of subdural hematomas bilaterally. No new acute findings are noted. - Neurosurgery, Dr. Arnoldo Morale, evaluated the patient, recommends against any neurosurgical intervention, plans to follow up as outpatient.  - Avoid antiplatelet/anticoagulation.

## 2021-06-27 NOTE — Assessment & Plan Note (Addendum)
Will continue holding lisinopril and lopressor in setting of syncope and normotension.

## 2021-06-27 NOTE — Assessment & Plan Note (Addendum)
Fall precautions. Therapy has recommended outpatient PT which will be in [place].

## 2021-06-27 NOTE — Assessment & Plan Note (Addendum)
Followed by Dr. Delton Coombes s/p bone marrow biopsy Jan 2023.  - Completed 4 days of decadron $RemoveBef'40mg'bpaKlCPyar$  daily with PPI for GI ppx. Platelet count has subsequently improved.  - Continue outpatient follow up with Dr. Delton Coombes who can also assist with suspected cancer.

## 2021-06-28 ENCOUNTER — Inpatient Hospital Stay (HOSPITAL_COMMUNITY): Payer: Medicare HMO

## 2021-06-28 DIAGNOSIS — I10 Essential (primary) hypertension: Secondary | ICD-10-CM

## 2021-06-28 LAB — BASIC METABOLIC PANEL
Anion gap: 12 (ref 5–15)
Anion gap: 10 (ref 5–15)
BUN: 13 mg/dL (ref 8–23)
BUN: 16 mg/dL (ref 8–23)
CO2: 24 mmol/L (ref 22–32)
CO2: 27 mmol/L (ref 22–32)
Calcium: 8.9 mg/dL (ref 8.9–10.3)
Calcium: 9.2 mg/dL (ref 8.9–10.3)
Chloride: 91 mmol/L — ABNORMAL LOW (ref 98–111)
Chloride: 91 mmol/L — ABNORMAL LOW (ref 98–111)
Creatinine, Ser: 0.7 mg/dL (ref 0.61–1.24)
Creatinine, Ser: 0.75 mg/dL (ref 0.61–1.24)
GFR, Estimated: >60 mL/min (ref >60)
GFR, Estimated: >60 mL/min (ref >60)
Glucose, Bld: 264 mg/dL — ABNORMAL HIGH (ref 70–99)
Glucose, Bld: 264 mg/dL — ABNORMAL HIGH (ref 70–99)
Potassium: 4.1 mmol/L (ref 3.5–5.1)
Potassium: 4.2 mmol/L (ref 3.5–5.1)
Sodium: 127 mmol/L — ABNORMAL LOW (ref 135–145)
Sodium: 128 mmol/L — ABNORMAL LOW (ref 135–145)

## 2021-06-28 LAB — GLUCOSE, CAPILLARY
Glucose-Capillary: 285 mg/dL — ABNORMAL HIGH (ref 70–99)
Glucose-Capillary: 242 mg/dL — ABNORMAL HIGH (ref 70–99)
Glucose-Capillary: 268 mg/dL — ABNORMAL HIGH (ref 70–99)
Glucose-Capillary: 272 mg/dL — ABNORMAL HIGH (ref 70–99)

## 2021-06-28 LAB — HEMOGLOBIN A1C
Hgb A1c MFr Bld: 7.5 % — ABNORMAL HIGH (ref 4.8–5.6)
Mean Plasma Glucose: 168.55 mg/dL

## 2021-06-28 LAB — CBC
HCT: 36.1 % — ABNORMAL LOW (ref 39.0–52.0)
Hemoglobin: 12.2 g/dL — ABNORMAL LOW (ref 13.0–17.0)
MCH: 28.2 pg (ref 26.0–34.0)
MCHC: 33.8 g/dL (ref 30.0–36.0)
MCV: 83.6 fL (ref 80.0–100.0)
Platelets: 53 10*3/uL — ABNORMAL LOW (ref 150–400)
RBC: 4.32 MIL/uL (ref 4.22–5.81)
RDW: 12.5 % (ref 11.5–15.5)
WBC: 4.8 10*3/uL (ref 4.0–10.5)
nRBC: 0 % (ref 0.0–0.2)

## 2021-06-28 LAB — CBC WITH DIFFERENTIAL/PLATELET
Abs Immature Granulocytes: 0.05 10*3/uL (ref 0.00–0.07)
Basophils Absolute: 0 10*3/uL (ref 0.0–0.1)
Basophils Relative: 0 %
Eosinophils Absolute: 0 10*3/uL (ref 0.0–0.5)
Eosinophils Relative: 0 %
HCT: 35.9 % — ABNORMAL LOW (ref 39.0–52.0)
Hemoglobin: 12.4 g/dL — ABNORMAL LOW (ref 13.0–17.0)
Immature Granulocytes: 1 %
Lymphocytes Relative: 7 %
Lymphs Abs: 0.6 10*3/uL — ABNORMAL LOW (ref 0.7–4.0)
MCH: 28.6 pg (ref 26.0–34.0)
MCHC: 34.5 g/dL (ref 30.0–36.0)
MCV: 82.7 fL (ref 80.0–100.0)
Monocytes Absolute: 0.5 10*3/uL (ref 0.1–1.0)
Monocytes Relative: 5 %
Neutro Abs: 7.5 10*3/uL (ref 1.7–7.7)
Neutrophils Relative %: 87 %
Platelets: 69 10*3/uL — ABNORMAL LOW (ref 150–400)
RBC: 4.34 MIL/uL (ref 4.22–5.81)
RDW: 12.4 % (ref 11.5–15.5)
WBC: 8.6 10*3/uL (ref 4.0–10.5)
nRBC: 0 % (ref 0.0–0.2)

## 2021-06-28 LAB — SODIUM, URINE, RANDOM: Sodium, Ur: 60 mmol/L

## 2021-06-28 LAB — CREATININE, URINE, RANDOM: Creatinine, Urine: 122.82 mg/dL

## 2021-06-28 MED ORDER — PANTOPRAZOLE SODIUM 40 MG PO TBEC
40.0000 mg | DELAYED_RELEASE_TABLET | Freq: Every day | ORAL | Status: DC
  Administered 2021-06-28 – 2021-07-03 (×6): 40 mg via ORAL
  Filled 2021-06-28 (×6): qty 1

## 2021-06-28 NOTE — Progress Notes (Addendum)
Progress Note   Patient: Wyatt [name] JXB:[id number] DOB: 1934/05/02 DOA: 06/27/2021     1 DOS: the patient was seen and examined on 06/28/2021   Brief hospital course: Wyatt [name] is a 88 y.o. male with medical history significant of DM, HTN, HLD, CAD, thrombocytopenia who presents after a syncopal episode at home.  Patient states that he has had right upper quadrant abdominal pain for about 3 months.  Yesterday he was at his baseline health.  This morning, he got out of bed and was walking to the restroom when he passed out.  He states that he was out for minutes.  He denies any recent illnesses, no chest pain, shortness of breath, nausea or vomiting.  Denies any gross blood loss from his thrombocytopenia.  He had recently undergone bone marrow biopsy at the end of January for thrombocytopenia work-up.  Assessment and Plan: * Subdural hematoma- (present on admission) S/p syncope at home. CT head: Acute right parietal subdural hematoma with maximum thickness of 6 mm. Mild mass effect upon the underlying cerebral hemisphere with approximately 2 mm of midline shift. 2. Tiny left subdural hematoma overlying the inferior left parietal lobe also noted. Neurosurgery consulted, no acute surgical intervention planned but due to his chronic thrombocytopenia, . Plan for transfer to [facility] .  Neuro checks. Repeat CT head this am showed Stable size and appearance of subdural hematomas bilaterally. No new acute findings are noted.  Pancreatic adenocarcinoma ([place])- (present on admission) New finding on CT A/P: Ill-defined low-density mass at the body with upstream pancreatic atrophy, mass roughly 3.1 cm in length. The mass infiltrates the peripancreatic fat, especially inferiorly. Associated splenic vein occlusion with collaterals. Patient followed by Dr. Delton Coombes for thrombocytopenia.  EDP discussed with Dr. Delton Coombes over the phone, he will arrange for close follow-up upon patient's discharge from the  hospital  Syncope, vasovagal- (present on admission) Fall precaution, orthostatic VS . Therapy evaluations ordered.   Thrombocytopenia ([place])- (present on admission) Followed by Dr. Delton Coombes S/p bone marrow biopsy Jan 2023  EDP discussed with Dr. Delton Coombes over the phone, recommended to start Decadron 40 mg daily for thrombocytopenia as well as Protonix for GI protection. Repeat labs today.   Coronary atherosclerosis- (present on admission) Hold aspirin in setting of SDH and thrombocytopenia. Pt denies any chest pain or sob.   Essential hypertension- (present on admission) Hold lisinopril and lopressor in setting of syncope. Check orthostatic vital signs.   HLD (hyperlipidemia)- (present on admission) Zocor   DM (diabetes mellitus) ([place]) Hold metformin SSI  CBG (last 3)  Recent Labs    06/27/21 0950 06/27/21 1730 06/28/21 0801  GLUCAP 208* 190* 285*   Check hemoglobin A1c .    Hyponatremia:  Unclear etiology.  Check tsh, am cortisol. Urine sodium and urine creatinine.    Subjective: headache resolved.   Physical Exam: Vitals:   06/27/21 1935 06/27/21 2326 06/28/21 0356 06/28/21 0806  BP: 131/72 (!) 117/52 118/64 131/74  Pulse: 91 90 90 88  Resp:  _0 Temp: 99.4 F (37.4 C) 98 F (36.7 C) 98.2 F (36.8 C) 98.4 F (36.9 C)  TempSrc: Oral Oral Oral Oral  SpO2: 96% 97% 94% 96%  Weight:      Height:       General exam: Appears calm and comfortable  Respiratory system: Clear to auscultation. Respiratory effort normal. Cardiovascular system: S1 & S2 heard, RRR. No JVD,  Gastrointestinal system: Abdomen is nondistended, soft and nontender.  Normal bowel  sounds heard. Central nervous system: comfortable. Alert and answering questions appropriately. No focal deficits seen.  Extremities: Symmetric 5 x 5 power. Skin: No rashes,  Psychiatry: Mood & affect appropriate.    Data Reviewed: Results for orders placed or performed during the hospital encounter  of 06/27/21 (from the past 24 hour(s))  CBG monitoring, ED     Status: Abnormal   Collection Time: 06/27/21  9:50 AM  Result Value Ref Range   Glucose-Capillary 208 (H) 70 - 99 mg/dL  Comprehensive metabolic panel     Status: Abnormal   Collection Time: 06/27/21 10:21 AM  Result Value Ref Range   Sodium 130 (L) 135 - 145 mmol/L   Potassium 4.5 3.5 - 5.1 mmol/L   Chloride 94 (L) 98 - 111 mmol/L   CO2 29 22 - 32 mmol/L   Glucose, Bld 201 (H) 70 - 99 mg/dL   BUN 10 8 - 23 mg/dL   Creatinine, Ser 0.60 (L) 0.61 - 1.24 mg/dL   Calcium 8.9 8.9 - 10.3 mg/dL   Total Protein 6.3 (L) 6.5 - 8.1 g/dL   Albumin 3.6 3.5 - 5.0 g/dL   AST 19 15 - 41 U/L   ALT 20 0 - 44 U/L   Alkaline Phosphatase 62 38 - 126 U/L   Total Bilirubin 1.1 0.3 - 1.2 mg/dL   GFR, Estimated >60 >60 mL/min   Anion gap 7 5 - 15  CBC with Differential     Status: Abnormal   Collection Time: 06/27/21 10:21 AM  Result Value Ref Range   WBC 7.5 4.0 - 10.5 K/uL   RBC 4.44 4.22 - 5.81 MIL/uL   Hemoglobin 12.7 (L) 13.0 - 17.0 g/dL   HCT 38.0 (L) 39.0 - 52.0 %   MCV 85.6 80.0 - 100.0 fL   MCH 28.6 26.0 - 34.0 pg   MCHC 33.4 30.0 - 36.0 g/dL   RDW 12.7 11.5 - 15.5 %   Platelets 50 (L) 150 - 400 K/uL   nRBC 0.0 0.0 - 0.2 %   Neutrophils Relative % 83 %   Neutro Abs 6.1 1.7 - 7.7 K/uL   Lymphocytes Relative 9 %   Lymphs Abs 0.7 0.7 - 4.0 K/uL   Monocytes Relative 7 %   Monocytes Absolute 0.6 0.1 - 1.0 K/uL   Eosinophils Relative 1 %   Eosinophils Absolute 0.1 0.0 - 0.5 K/uL   Basophils Relative 0 %   Basophils Absolute 0.0 0.0 - 0.1 K/uL   WBC Morphology MORPHOLOGY UNREMARKABLE    Immature Granulocytes 0 %   Abs Immature Granulocytes 0.03 0.00 - 0.07 K/uL   Spherocytes PRESENT   Troponin I (High Sensitivity)     Status: None   Collection Time: 06/27/21 10:21 AM  Result Value Ref Range   Troponin I (High Sensitivity) 3 <18 ng/L  Troponin I (High Sensitivity)     Status: None   Collection Time: 06/27/21 12:20 PM   Result Value Ref Range   Troponin I (High Sensitivity) 3 <18 ng/L  Resp Panel by RT-PCR (Flu A&B, Covid) Nasopharyngeal Swab     Status: None   Collection Time: 06/27/21  1:14 PM   Specimen: Nasopharyngeal Swab; Nasopharyngeal(NP) swabs in vial transport medium  Result Value Ref Range   SARS Coronavirus 2 by RT PCR NEGATIVE NEGATIVE   Influenza A by PCR NEGATIVE NEGATIVE   Influenza B by PCR NEGATIVE NEGATIVE  CBG monitoring, ED     Status: Abnormal   Collection Time: 06/27/21  5:30 PM  Result Value Ref Range   Glucose-Capillary 190 (H) 70 - 99 mg/dL  Glucose, capillary     Status: Abnormal   Collection Time: 06/28/21  8:01 AM  Result Value Ref Range   Glucose-Capillary 285 (H) 70 - 99 mg/dL     Family Communication: none at bedside.   Disposition: Status is: Inpatient Remains inpatient appropriate because: subdural hematoma.           Planned Discharge Destination: Home with Home Health     Time spent: 38 minutes  Author: Hosie Poisson, MD 06/28/2021 9:30 AM  For on call review [email or web address].

## 2021-06-28 NOTE — Consult Note (Signed)
Reason for Consult: Bilateral subdural hematomas, thrombocytopenia Referring Physician: Dr. Dion Body is an 88 y.o. male.  HPI: The patient is an 88 year old white male with a history of thrombocytopenia who by report took a fall yesterday.  He was seen at the [facility], ER and worked up with a head CT which demonstrated small bilateral subdural hematomas.  I was contacted and recommended the patient be admitted for observation and repeat head CT given his thrombocytopenia.  The patient was transferred to [facility] and admitted by Dr. Maylene Roes.  Presently the patient is alert and pleasant.  He is mildly confused which I think is his baseline.  He is accompanied by his daughter who fills in some the details.  She tells me he has been diagnosed with pancreatic cancer and has been losing weight.  The patient admits to some chronic neck pain.  He denies headaches, seizures, etc.  He does not recall falling.  Past Medical History:  Diagnosis Date   Diabetes mellitus    Hypercholesteremia    Hypertension     Past Surgical History:  Procedure Laterality Date   CARDIAC CATHETERIZATION     CHOLECYSTECTOMY N/A 06/26/2012   Procedure: LAPAROSCOPIC CHOLECYSTECTOMY;  Surgeon: Donato Heinz, MD;  Location: [facility] ORS;  Service: General;  Laterality: N/A;   COLONOSCOPY  06/10/2011   Procedure: COLONOSCOPY;  Surgeon: Rogene Houston, MD;  Location: [facility] ENDO SUITE;  Service: Endoscopy;  Laterality: N/A;  1030   ESOPHAGEAL DILATION N/A 07/18/2019   Procedure: ESOPHAGEAL DILATION;  Surgeon: Rogene Houston, MD;  Location: [facility] ENDO SUITE;  Service: Endoscopy;  Laterality: N/A;   ESOPHAGOGASTRODUODENOSCOPY N/A 07/18/2019   Procedure: ESOPHAGOGASTRODUODENOSCOPY (EGD);  Surgeon: Rogene Houston, MD;  Location: [facility] END[facility] SU[facility];  Service: Endoscopy;  Laterality: N/A;  215 - moved to 8:30 per Lelon Frohlich, pt is aware    Family History  Problem Relation Age of Onset   Colon cancer Mother     Social History:   reports that he has quit smoking. He has a 7.00 pack-year smoking history. He has quit using smokeless tobacco. He reports that he does not drink alcohol and does not use drugs.  Allergies: No Known Allergies  Medications: I have reviewed the patient's current medications. Prior to Admission:  Medications Prior to Admission  Medication Sig Dispense Refill Last Dose   famotidine (PEPCID) 20 MG tablet Take 1 tablet (20 mg total) by mouth at bedtime.   Past Week   ibuprofen (ADVIL) 200 MG tablet Take 200 mg by mouth every 6 (six) hours as needed.   06/26/2021   lisinopril (PRINIVIL,ZESTRIL) 40 MG tablet Take 40 mg by mouth daily.    06/26/2021   metFORMIN (GLUCOPHAGE) 1000 MG tablet Take 500 mg by mouth 2 (two) times daily.   06/26/2021   metoprolol tartrate (LOPRESSOR) 25 MG tablet Take 12.5 mg by mouth 2 (two) times daily.   06/26/2021 at AM   nitroGLYCERIN (NITROSTAT) 0.4 MG SL tablet Place 0.4 mg under the tongue every 5 (five) minutes as needed for chest pain.   unknown   pantoprazole (PROTONIX) 40 MG tablet Take 1 tablet (40 mg total) by mouth daily before breakfast. 30 tablet 5 06/26/2021   simvastatin (ZOCOR) 20 MG tablet Take 20 mg by mouth daily.    06/26/2021   traMADol (ULTRAM) 50 MG tablet Take 1 tablet (50 mg total) by mouth 2 (two) times daily as needed. (Patient not taking: Reported on 06/27/2021) 30 tablet  0 Not Taking   Scheduled:  dexamethasone  40 mg Oral Daily   insulin aspart  0-9 Units Subcutaneous TID WC   pantoprazole  40 mg Oral Q0600   simvastatin  20 mg Oral Daily   sodium chloride flush  3 mL Intravenous Q12H   sodium chloride flush  3 mL Intravenous Q12H   Continuous:  sodium chloride     LPF:XTKWIO chloride, acetaminophen **OR** acetaminophen, ondansetron **OR** ondansetron (ZOFRAN) IV, polyethylene glycol, sodium chloride flush Anti-infectives (From admission, onward)    None        Results for orders placed or performed during the hospital encounter of  06/27/21 (from the past 48 hour(s))  CBG monitoring, ED     Status: Abnormal   Collection Time: 06/27/21  9:50 AM  Result Value Ref Range   Glucose-Capillary 208 (H) 70 - 99 mg/dL    Comment: Glucose reference range applies only to samples taken after fasting for at least 8 hours.  Comprehensive metabolic panel     Status: Abnormal   Collection Time: 06/27/21 10:21 AM  Result Value Ref Range   Sodium 130 (L) 135 - 145 mmol/L   Potassium 4.5 3.5 - 5.1 mmol/L   Chloride 94 (L) 98 - 111 mmol/L   CO2 29 22 - 32 mmol/L   Glucose, Bld 201 (H) 70 - 99 mg/dL    Comment: Glucose reference range applies only to samples taken after fasting for at least 8 hours.   BUN 10 8 - 23 mg/dL   Creatinine, Ser 0.60 (L) 0.61 - 1.24 mg/dL   Calcium 8.9 8.9 - 10.3 mg/dL   Total Protein 6.3 (L) 6.5 - 8.1 g/dL   Albumin 3.6 3.5 - 5.0 g/dL   AST 19 15 - 41 U/L   ALT 20 0 - 44 U/L   Alkaline Phosphatase 62 38 - 126 U/L   Total Bilirubin 1.1 0.3 - 1.2 mg/dL   GFR, Estimated >60 >60 mL/min    Comment: (NOTE) Calculated using the CKD-EPI Creatinine Equation (2021)    Anion gap 7 5 - 15    Comment: Performed at [facility], [address] [place], [place] [address]  CBC with Differential     Status: Abnormal   Collection Time: 06/27/21 10:21 AM  Result Value Ref Range   WBC 7.5 4.0 - 10.5 K/uL   RBC 4.44 4.22 - 5.81 MIL/uL   Hemoglobin 12.7 (L) 13.0 - 17.0 g/dL   HCT 38.0 (L) 39.0 - 52.0 %   MCV 85.6 80.0 - 100.0 fL   MCH 28.6 26.0 - 34.0 pg   MCHC 33.4 30.0 - 36.0 g/dL   RDW 12.7 11.5 - 15.5 %   Platelets 50 (L) 150 - 400 K/uL    Comment: SPECIMEN CHECKED FOR CLOTS Immature Platelet Fraction may be clinically indicated, consider ordering this additional test [id number] PLATELET COUNT CONFIRMED BY SMEAR    nRBC 0.0 0.0 - 0.2 %   Neutrophils Relative % 83 %   Neutro Abs 6.1 1.7 - 7.7 K/uL   Lymphocytes Relative 9 %   Lymphs Abs 0.7 0.7 - 4.0 K/uL   Monocytes Relative 7 %   Monocytes Absolute 0.6  0.1 - 1.0 K/uL   Eosinophils Relative 1 %   Eosinophils Absolute 0.1 0.0 - 0.5 K/uL   Basophils Relative 0 %   Basophils Absolute 0.0 0.0 - 0.1 K/uL   WBC Morphology MORPHOLOGY UNREMARKABLE    Immature Granulocytes 0 %   Abs Immature  Granulocytes 0.03 0.00 - 0.07 K/uL   Spherocytes PRESENT     Comment: Performed at [facility], [address] [place], [place] [address]  Troponin I (High Sensitivity)     Status: None   Collection Time: 06/27/21 10:21 AM  Result Value Ref Range   Troponin I (High Sensitivity) 3 <18 ng/L    Comment: (NOTE) Elevated high sensitivity troponin I (hsTnI) values and significant  changes across serial measurements may suggest ACS but many other  chronic and acute conditions are known to elevate hsTnI results.  Refer to the "Links" section for chest pain algorithms and additional  guidance. Performed at [facility], [address], [place], [place] [address]   Troponin I (High Sensitivity)     Status: None   Collection Time: 06/27/21 12:20 PM  Result Value Ref Range   Troponin I (High Sensitivity) 3 <18 ng/L    Comment: (NOTE) Elevated high sensitivity troponin I (hsTnI) values and significant  changes across serial measurements may suggest ACS but many other  chronic and acute conditions are known to elevate hsTnI results.  Refer to the "Links" section for chest pain algorithms and additional  guidance. Performed at [facility], [address] [place], [place] [address]   Resp Panel by RT-PCR (Flu A&B, Covid) Nasopharyngeal Swab     Status: None   Collection Time: 06/27/21  1:14 PM   Specimen: Nasopharyngeal Swab; Nasopharyngeal(NP) swabs in vial transport medium  Result Value Ref Range   SARS Coronavirus 2 by RT PCR NEGATIVE NEGATIVE    Comment: (NOTE) SARS-CoV-2 target nucleic acids are NOT DETECTED.  The SARS-CoV-2 RNA is generally detectable in upper respiratory specimens during the acute phase of infection. The lowest concentration of  SARS-CoV-2 viral copies this assay can detect is 138 copies/mL. A negative result does not preclude SARS-Cov-2 infection and should not be used as the sole basis for treatment or other patient management decisions. A negative result may occur with  improper specimen collection/handling, submission of specimen other than nasopharyngeal swab, presence of viral mutation(s) within the areas targeted by this assay, and inadequate number of viral copies(<138 copies/mL). A negative result must be combined with clinical observations, patient history, and epidemiological information. The expected result is Negative.  Fact Sheet for Patients:  EntrepreneurPulse.com.au  Fact Sheet for Healthcare Providers:  IncredibleEmployment.be  This test is no t yet approved or cleared by the [place] FDA and  has been authorized for detection and/or diagnosis of SARS-CoV-2 by FDA under an Emergency Use Authorization (EUA). This EUA will remain  in effect (meaning this test can be used) for the duration of the COVID-19 declaration under Section 564(b)(1) of the Act, 21 U.S.C.section 360bbb-3(b)(1), unless the authorization is terminated  or revoked sooner.       Influenza A by PCR NEGATIVE NEGATIVE   Influenza B by PCR NEGATIVE NEGATIVE    Comment: (NOTE) The Xpert Xpress SARS-CoV-2/FLU/RSV plus assay is intended as an aid in the diagnosis of influenza from Nasopharyngeal swab specimens and should not be used as a sole basis for treatment. Nasal washings and aspirates are unacceptable for Xpert Xpress SARS-CoV-2/FLU/RSV testing.  Fact Sheet for Patients: EntrepreneurPulse.com.au  Fact Sheet for Healthcare Providers: IncredibleEmployment.be  This test is not yet approved or cleared by the [place] FDA and has been authorized for detection and/or diagnosis of SARS-CoV-2 by FDA under an Emergency Use Authorization (EUA).  This EUA will remain in effect (meaning this test can be used) for the  duration of the COVID-19 declaration under Section 564(b)(1) of the Act, 21 U.S.C. section 360bbb-3(b)(1), unless the authorization is terminated or revoked.  Performed at [facility], [address], [place], [place] [address]   CBG monitoring, ED     Status: Abnormal   Collection Time: 06/27/21  5:30 PM  Result Value Ref Range   Glucose-Capillary 190 (H) 70 - 99 mg/dL    Comment: Glucose reference range applies only to samples taken after fasting for at least 8 hours.  Glucose, capillary     Status: Abnormal   Collection Time: 06/28/21  8:01 AM  Result Value Ref Range   Glucose-Capillary 285 (H) 70 - 99 mg/dL    Comment: Glucose reference range applies only to samples taken after fasting for at least 8 hours.    DG Chest 1 View  Result Date: 06/27/2021 CLINICAL DATA:  Status post fall. EXAM: CHEST  1 VIEW COMPARISON:  06/23/2012 FINDINGS: The heart size and mediastinal contours are within normal limits. Aortic atherosclerosis. Both lungs are clear. The visualized skeletal structures are unremarkable. IMPRESSION: No active disease. Electronically Signed   By: Kerby Moors M.D.   On: 06/27/2021 11:41   CT HEAD WO CONTRAST  Result Date: 06/28/2021 CLINICAL DATA:  88 year old male with history of bilateral subdural hematomas. Follow-up study. EXAM: CT HEAD WITHOUT CONTRAST TECHNIQUE: Contiguous axial images were obtained from the base of the skull through the vertex without intravenous contrast. RADIATION DOSE REDUCTION: This exam was performed according to the departmental dose-optimization program which includes automated exposure control, adjustment of the mA and/or kV according to patient size and/or use of iterative reconstruction technique. COMPARISON:  Head CT 06/27/2021. FINDINGS: Brain: There is again a trace left subdural high attenuation fluid collection adjacent to the sylvian fissure (coronal image 38 of  series 5) measuring 2 mm in thickness, stable. Right-sided subdural high attenuation fluid collection adjacent to the right posterior frontal and parietal region (coronal image 29 of series 5), also similar to the prior study measuring up to 7 mm in thickness on today's examination. No new acute intracranial hemorrhage otherwise noted. No intraventricular blood. Moderate cerebral atrophy with ex vacuo dilatation of the ventricular system. Patchy and confluent areas of decreased attenuation are noted throughout the deep and periventricular white matter of the cerebral hemispheres bilaterally, compatible with chronic microvascular ischemic disease. Vascular: Extensive atherosclerosis in the cerebral vasculature. Skull: Normal. Negative for fracture or focal lesion. Sinuses/Orbits: No acute finding. Other: None. IMPRESSION: 1. Stable size and appearance of subdural hematomas bilaterally. No new acute findings are noted. 2. Moderate cerebral atrophy with ex vacuo dilatation of the ventricular system and extensive chronic microvascular ischemic changes in the cerebral white matter. Electronically Signed   By: Vinnie Langton M.D.   On: 06/28/2021 08:00   CT Head Wo Contrast  Result Date: 06/27/2021 CLINICAL DATA:  Status post fall.  Hit head on bathroom floor. EXAM: CT HEAD WITHOUT CONTRAST CT CERVICAL SPINE WITHOUT CONTRAST TECHNIQUE: Multidetector CT imaging of the head and cervical spine was performed following the standard protocol without intravenous contrast. Multiplanar CT image reconstructions of the cervical spine were also generated. RADIATION DOSE REDUCTION: This exam was performed according to the departmental dose-optimization program which includes automated exposure control, adjustment of the mA and/or kV according to patient size and/or use of iterative reconstruction technique. COMPARISON:  07/14/2020 FINDINGS: CT HEAD FINDINGS Brain: Acute right parietal subdural hematoma is identified, image 33/4.  This has a maximum thickness of 6 mm, image  33/4. Mild mass effect upon the underlying cerebral hemisphere with approximately 2 mm of midline shift. Tiny left subdural hematoma is also noted overlying the inferior left parietal lobe measuring 3 mm, image 19/2. No signs of intraventricular hemorrhage. Prominence of the sulci and ventricles compatible with atrophy. There is mild diffuse low-attenuation within the subcortical and periventricular white matter compatible with chronic microvascular disease. Vascular: No hyperdense vessel or unexpected calcification. Skull: Normal. Negative for fracture or focal lesion. Sinuses/Orbits: The paranasal sinuses and mastoid air cells are clear. Other: None. CT CERVICAL SPINE FINDINGS Alignment: Normal. Skull base and vertebrae: No acute fracture. No primary bone lesion or focal pathologic process. Soft tissues and spinal canal: No prevertebral fluid or swelling. No visible canal hematoma. Disc levels: Multilevel disc space narrowing and endplate spurring noted at C4-5 through C6-7. Upper chest: Negative. Other: None IMPRESSION: 1. Acute right parietal subdural hematoma with maximum thickness of 6 mm. Mild mass effect upon the underlying cerebral hemisphere with approximately 2 mm of midline shift. 2. Tiny left subdural hematoma overlying the inferior left parietal lobe also noted. 3. Chronic small vessel ischemic disease and atrophy. 4. No evidence for cervical spine fracture or subluxation. 5. Cervical degenerative disc disease. Critical Value/emergent results were called by telephone at the time of interpretation on 06/27/2021 at 11:30 am to provider [facility] , who verbally acknowledged these results. Electronically Signed   By: Kerby Moors M.D.   On: 06/27/2021 11:30   CT Cervical Spine Wo Contrast  Result Date: 06/27/2021 CLINICAL DATA:  Status post fall.  Hit head on bathroom floor. EXAM: CT HEAD WITHOUT CONTRAST CT CERVICAL SPINE WITHOUT CONTRAST  TECHNIQUE: Multidetector CT imaging of the head and cervical spine was performed following the standard protocol without intravenous contrast. Multiplanar CT image reconstructions of the cervical spine were also generated. RADIATION DOSE REDUCTION: This exam was performed according to the departmental dose-optimization program which includes automated exposure control, adjustment of the mA and/or kV according to patient size and/or use of iterative reconstruction technique. COMPARISON:  07/14/2020 FINDINGS: CT HEAD FINDINGS Brain: Acute right parietal subdural hematoma is identified, image 33/4. This has a maximum thickness of 6 mm, image 33/4. Mild mass effect upon the underlying cerebral hemisphere with approximately 2 mm of midline shift. Tiny left subdural hematoma is also noted overlying the inferior left parietal lobe measuring 3 mm, image 19/2. No signs of intraventricular hemorrhage. Prominence of the sulci and ventricles compatible with atrophy. There is mild diffuse low-attenuation within the subcortical and periventricular white matter compatible with chronic microvascular disease. Vascular: No hyperdense vessel or unexpected calcification. Skull: Normal. Negative for fracture or focal lesion. Sinuses/Orbits: The paranasal sinuses and mastoid air cells are clear. Other: None. CT CERVICAL SPINE FINDINGS Alignment: Normal. Skull base and vertebrae: No acute fracture. No primary bone lesion or focal pathologic process. Soft tissues and spinal canal: No prevertebral fluid or swelling. No visible canal hematoma. Disc levels: Multilevel disc space narrowing and endplate spurring noted at C4-5 through C6-7. Upper chest: Negative. Other: None IMPRESSION: 1. Acute right parietal subdural hematoma with maximum thickness of 6 mm. Mild mass effect upon the underlying cerebral hemisphere with approximately 2 mm of midline shift. 2. Tiny left subdural hematoma overlying the inferior left parietal lobe also noted. 3.  Chronic small vessel ischemic disease and atrophy. 4. No evidence for cervical spine fracture or subluxation. 5. Cervical degenerative disc disease. Critical Value/emergent results were called by telephone at the time of interpretation on 06/27/2021 at 11:30  am to provider [facility] , who verbally acknowledged these results. Electronically Signed   By: Kerby Moors M.D.   On: 06/27/2021 11:30   CT Abdomen Pelvis W Contrast  Result Date: 06/27/2021 CLINICAL DATA:  Right lower quadrant pain for 5 months EXAM: CT ABDOMEN AND PELVIS WITH CONTRAST TECHNIQUE: Multidetector CT imaging of the abdomen and pelvis was performed using the standard protocol following bolus administration of intravenous contrast. RADIATION DOSE REDUCTION: This exam was performed according to the departmental dose-optimization program which includes automated exposure control, adjustment of the mA and/or kV according to patient size and/or use of iterative reconstruction technique. CONTRAST:  160mL OMNIPAQUE IOHEXOL 300 MG/ML  SOLN COMPARISON:  06/29/2012 06/29/2012 FINDINGS: Lower chest: No acute finding. Mild subpleural reticulation at the bases. Coronary atherosclerosis. Hepatobiliary: No focal liver abnormality.Cholecystectomy. No bile duct dilatation. Pancreas: Ill-defined low-density mass at the body with upstream pancreatic atrophy, mass roughly 3.1 cm in length. The mass infiltrates the peripancreatic fat, especially inferiorly. Associated splenic vein occlusion with collaterals. Spleen: Unremarkable. Adrenals/Urinary Tract: Negative adrenals. No hydronephrosis or stone. Left renal cystic density. Unremarkable bladder. Stomach/Bowel: Negative for bowel obstruction or visible inflammation. Extensive sigmoid diverticulosis. Reticulation fat around the rectal and sigmoid segments is primarily from peritoneal disease. No suspected colitis. No appendicitis. Vascular/Lymphatic: Extensive atheromatous calcification of the aorta and  branch vessels. Chronic splenic vein occlusion with collaterals as noted above. No noted adenopathy Reproductive:Unremarkable for age Other: Reticulation of the omentum diffusely with small volume peritoneal fluid in the pelvis where there is peritoneal thickening and enhancement. No measurable nodule. Musculoskeletal: No acute abnormalities. IMPRESSION: 1. Constellation of findings suggest pancreas adenocarcinoma with peritoneal spread. 2. 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 Electronically Signed   By: Jorje Guild M.D.   On: 06/27/2021 12:44   DG HIP UNILAT WITH PELVIS 2-3 VIEWS RIGHT  Result Date: 06/27/2021 CLINICAL DATA:  Fall.  Right-sided abdominal pain. EXAM: DG HIP (WITH OR WITHOUT PELVIS) 2-3V RIGHT COMPARISON:  None FINDINGS: There is no evidence of hip fracture or dislocation. Mild right hip osteoarthritis. Soft tissues are unremarkable. IMPRESSION: 1. No acute findings. 2. Mild right hip osteoarthritis. Electronically Signed   By: Kerby Moors M.D.   On: 06/27/2021 11:40    ROS: As above and he admits to some mild neck pain. Blood pressure 131/74, pulse 88, temperature 98.4 F (36.9 C), temperature source Oral, resp. rate 18, height 6' (1.829 m), weight 64.9 kg, SpO2 96 %. Estimated body mass index is 19.39 kg/m as calculated from the following:   Height as of this encounter: 6' (1.829 m).   Weight as of this encounter: 64.9 kg.  Physical Exam  General: An alert, pleasant and mildly confused 88 year old thin white male in no apparent distress  HEENT: Normocephalic, extract muscles intact  Neck: Supple without obvious deformities.  He has limited range of motion.  Thorax: Symmetric  Abdomen: Soft  Extremities: Unremarkable  Neurologic exam: The patient is alert and oriented x2, person, a hospital (he thought he was at [facility]) he could not tell me the month.  Glasgow Coma Scale 14.  Cranial nerves II  through XII are examined bilaterally and grossly normal except he has bilateral presbycusis.  Cerebellar function is intact to rapid alternating movements of the upper extremities bilaterally.  Sensory function is intact to light touch sensation all tested dermatomes bilaterally.  The patient strength is normal, i.e. 5/5 in his bilateral bicep, tricep, handgrip, gastrocnemius and  dorsiflexors although he does giveaway a bit.  I reviewed the patient's head CT performed yesterday at [facility] and today at [facility].  He has tiny bilateral acute subdural hematomas and diffuse brain atrophy.  I will also reviewed his cervical CT which demonstrates degenerative changes.  Assessment/Plan: Tiny bilateral acute subdural hematoma, thrombocytopenia: The patient's head scan is stable.  I will sign off.  Please have him follow-up with me in the office in a few weeks.  Ophelia Charter 06/28/2021, 10:03 AM

## 2021-06-29 ENCOUNTER — Encounter (HOSPITAL_COMMUNITY): Payer: Self-pay | Admitting: Hematology

## 2021-06-29 DIAGNOSIS — E871 Hypo-osmolality and hyponatremia: Secondary | ICD-10-CM

## 2021-06-29 LAB — BASIC METABOLIC PANEL
Anion gap: 9 (ref 5–15)
BUN: 25 mg/dL — ABNORMAL HIGH (ref 8–23)
CO2: 25 mmol/L (ref 22–32)
Calcium: 9.2 mg/dL (ref 8.9–10.3)
Chloride: 92 mmol/L — ABNORMAL LOW (ref 98–111)
Creatinine, Ser: 0.88 mg/dL (ref 0.61–1.24)
GFR, Estimated: >60 mL/min (ref >60)
Glucose, Bld: 264 mg/dL — ABNORMAL HIGH (ref 70–99)
Potassium: 4.3 mmol/L (ref 3.5–5.1)
Sodium: 126 mmol/L — ABNORMAL LOW (ref 135–145)

## 2021-06-29 LAB — OSMOLALITY: Osmolality: 280 mOsm/kg (ref 275–295)

## 2021-06-29 LAB — GLUCOSE, CAPILLARY
Glucose-Capillary: 235 mg/dL — ABNORMAL HIGH (ref 70–99)
Glucose-Capillary: 218 mg/dL — ABNORMAL HIGH (ref 70–99)
Glucose-Capillary: 317 mg/dL — ABNORMAL HIGH (ref 70–99)
Glucose-Capillary: 275 mg/dL — ABNORMAL HIGH (ref 70–99)

## 2021-06-29 LAB — CORTISOL-AM, BLOOD: Cortisol - AM: 1.3 ug/dL — ABNORMAL LOW (ref 6.7–22.6)

## 2021-06-29 LAB — TSH: TSH: 0.553 u[IU]/mL (ref 0.350–4.500)

## 2021-06-29 LAB — SODIUM: Sodium: 125 mmol/L — ABNORMAL LOW (ref 135–145)

## 2021-06-29 MED ORDER — SODIUM CHLORIDE 0.9 % IV SOLN: INTRAVENOUS | Status: DC

## 2021-06-29 MED ORDER — INSULIN ASPART 100 UNIT/ML IJ SOLN
3.0000 [IU] | Freq: Three times a day (TID) | INTRAMUSCULAR | Status: DC
  Administered 2021-06-29 – 2021-07-03 (×13): 3 [IU] via SUBCUTANEOUS

## 2021-06-29 NOTE — TOC CAGE-AID Note (Signed)
Transition of Care [facility]) - CAGE-AID Screening   Patient Details  Name: ELISHUA RADFORD MRN: [id number] Date of Birth: 12/12/1933  Transition of Care [facility]) CM/SW Contact:    Gaetano Hawthorne Tarpley-Carter, [place] Phone Number: 06/29/2021, 11:23 AM   Clinical Narrative: Pt participated in [place].  Pt stated he does not use substance or ETOH.  Pt was not offered resources, due to no usage of substance or ETOH.    Adrianna Dudas Tarpley-Carter, MSW, LCSW-A Pronouns:  She/Her/Hers [place] Transitions of Care Clinical Social Worker Direct Number:  [phone] [email or web address]   CAGE-AID Screening:    Have You Ever Felt You Ought to Cut Down on Your Drinking or Drug Use?: No Have People Annoyed You By [employer] Your Drinking Or Drug Use?: No Have You Felt Bad Or Guilty About Your Drinking Or Drug Use?: No Have You Ever Had a Drink or Used Drugs First Thing In The Morning to Steady Your Nerves or to Get Rid of a Hangover?: No CAGE-AID Score: 0  Substance Abuse Education Offered: No

## 2021-06-29 NOTE — TOC Initial Note (Signed)
Transition of Care [facility]) - Initial/Assessment Note    Patient Details  Name: Wyatt [name] MRN: [id number] Date of Birth: [birth year]  Transition of Care [facility]) CM/SW Contact:    Pollie Friar, RN Phone Number: 06/29/2021, 4:23 PM  Clinical Narrative:                 Patient is from home with spouse. He states they have all the needed DME at home. He denies any issues with his home medications. He states his wife can provide needed transportation.  Recommendations for outpatient PT. Pt prefers to attend at [facility]. Orders in Epic and information on the AVS. TOC following.  Expected Discharge Plan: OP Rehab Barriers to Discharge: Continued Medical Work up   Patient Goals and CMS Choice     Choice offered to / list presented to : Patient  Expected Discharge Plan and Services Expected Discharge Plan: OP Rehab   Discharge Planning Services: CM Consult   Living arrangements for the past 2 months: Single Family Home                                      Prior Living Arrangements/Services Living arrangements for the past 2 months: Single Family Home Lives with:: Spouse Patient language and need for interpreter reviewed:: Yes Do you feel safe going back to the place where you live?: Yes      Need for Family Participation in Patient Care: Yes (Comment) Care giver support system in place?: Yes (comment) Current home services: DME (All the DME per pt) Criminal Activity/Legal Involvement Pertinent to Current Situation/Hospitalization: No - Comment as needed  Activities of Daily Living Home Assistive Devices/Equipment: Eyeglasses ADL Screening (condition at time of admission) Patient's cognitive ability adequate to safely complete daily activities?: Yes Is the patient deaf or have difficulty hearing?: No Does the patient have difficulty seeing, even when wearing glasses/contacts?: No Does the patient have difficulty concentrating, remembering, or making decisions?:  No Patient able to express need for assistance with ADLs?: Yes Does the patient have difficulty dressing or bathing?: No Independently performs ADLs?: Yes (appropriate for developmental age) Does the patient have difficulty walking or climbing stairs?: Yes Weakness of Legs: Both Weakness of Arms/Hands: Both  Permission Sought/Granted                  Emotional Assessment Appearance:: Appears stated age Attitude/Demeanor/Rapport: Engaged Affect (typically observed): Accepting Orientation: : Oriented to Self, Oriented to Place, Oriented to  Time, Oriented to Situation   Psych Involvement: No (comment)  Admission diagnosis:  Subdural hematoma [S06.5XAA] Fall [W19.XXXA] Right hip pain [M25.551] Patient Active Problem List   Diagnosis Date Noted   Hyponatremia 06/29/2021   Syncope, vasovagal 06/27/2021   Subdural hematoma 06/27/2021   Pancreatic adenocarcinoma ([place]) 06/27/2021   Thrombocytopenia ([place]) 05/21/2021   DM (diabetes mellitus) ([place]) 05/29/2009   HLD (hyperlipidemia) 05/29/2009   Essential hypertension 05/29/2009   Coronary atherosclerosis 05/29/2009   PCP:  Asencion Noble, MD Pharmacy:   [facility] [place] [address] Phone: [phone] Fax: [phone]     Social Determinants of Health (SDOH) Interventions    Readmission Risk Interventions No flowsheet data found.

## 2021-06-29 NOTE — Progress Notes (Signed)
Request to IR for pancreatic mass biopsy -- patient imaging reviewed by Dr. Anselm Pancoast today who does not approve pancreatic mass biopsy (not typically done in IR here), but does approve omental biopsy to be done as an outpatient given recent SDH and need for sedation for biopsy.  Discussed with primary team today, order placed for schedulers to call patient to arrange biopsy time, IR contact info placed in AVS.  Inpatient biopsy order will be cancelled. Please call IR with questions or concerns.  Candiss Norse, PA-C

## 2021-06-29 NOTE — Plan of Care (Signed)
  Problem: Education: Goal: Knowledge of General Education information will improve Description: Including pain rating scale, medication(s)/side effects and non-pharmacologic comfort measures Outcome: Progressing   Problem: Clinical Measurements: Goal: Respiratory complications will improve Outcome: Progressing   

## 2021-06-29 NOTE — Progress Notes (Signed)
°  Transition of Care [facility]) Screening Note   Patient Details  Name: NDREW CREASON Date of Birth: Nov 01, 1933   Transition of Care [facility]) CM/SW Contact:    Pollie Friar, RN Phone Number: 06/29/2021, 3:15 PM    Transition of Care Department [facility]) has reviewed patient. We will continue to monitor patient advancement through interdisciplinary progression rounds. If new patient transition needs arise, please place a TOC consult. Awaiting PT/OT evals.

## 2021-06-29 NOTE — Progress Notes (Signed)
Progress Note   Patient: Wyatt [name] ZES:[id number] DOB: 10/05/1933 DOA: 06/27/2021     2 DOS: the patient was seen and examined on 06/29/2021   Brief hospital course: Wyatt [name] is a 88 y.o. male with medical history significant of DM, HTN, HLD, CAD, thrombocytopenia who presents after a syncopal episode at home.  Patient states that he has had right upper quadrant abdominal pain for about 3 months.  Yesterday he was at his baseline health.  This morning, he got out of bed and was walking to the restroom when he passed out.  He states that he was out for minutes.  He denies any recent illnesses, no chest pain, shortness of breath, nausea or vomiting.  Denies any gross blood loss from his thrombocytopenia.  He had recently undergone bone marrow biopsy at the end of January for thrombocytopenia work-up.  Assessment and Plan: * Subdural hematoma- (present on admission) S/p syncope at home. CT head: Acute right parietal subdural hematoma with maximum thickness of 6 mm. Mild mass effect upon the underlying cerebral hemisphere with approximately 2 mm of midline shift. 2. Tiny left subdural hematoma overlying the inferior left parietal lobe also noted. Neurosurgery consulted, no acute surgical intervention planned but due to his chronic thrombocytopenia, . Plan for transfer to [facility] .  Neuro checks. Repeat CT head showed Stable size and appearance of subdural hematomas bilaterally. No new acute findings are noted. Neuro surgery signed off.  Pt reports his headache has improved.   Pancreatic adenocarcinoma ([place])- (present on admission) New finding on CT A/P: Ill-defined low-density mass at the body with upstream pancreatic atrophy, mass roughly 3.1 cm in length. The mass infiltrates the peripancreatic fat, especially inferiorly. Associated splenic vein occlusion with collaterals. IR consulted plan for outpatient omental biopsy.  Gi requested to see if he is a candidate for EUs. Recommended  outpatient follow up.  Patient followed by Dr. Delton Coombes for thrombocytopenia.  EDP discussed with Dr. Delton Coombes over the phone, he will arrange for close follow-up upon patient's discharge from the hospital  Syncope, vasovagal- (present on admission) Fall precaution, orthostatic VS . Therapy evaluations ordered.   Thrombocytopenia ([place])- (present on admission) Followed by Dr. Delton Coombes S/p bone marrow biopsy Jan 2023 , shows miId MDS, in addition hypercellular bone marrow with megakaryocytes.  EDP discussed with Dr. Delton Coombes over the phone, recommended to start Decadron 40 mg daily for thrombocytopenia as well as Protonix for GI protection. Repeat labs today.   Coronary atherosclerosis- (present on admission) Hold aspirin in setting of SDH and thrombocytopenia. Pt denies any chest pain or sob.   Essential hypertension- (present on admission) Hold lisinopril and lopressor in setting of syncope. Check orthostatic vital signs with therapy evaluations.    HLD (hyperlipidemia)- (present on admission) Zocor   DM (diabetes mellitus) ([place]) Hold metformin SSI  CBG (last 3)  Recent Labs    06/27/21 0950 06/27/21 1730 06/28/21 0801  GLUCAP 208* 190* 285*   Hemoglobin A1c is 75%   Hyponatremia:  Unclear etiology. Suspect its from dehydration and poor oral intake.  Low cortisol in am. ? From the decadron injections.  Tsh wnl.  URINE sodium is adequate.  Serum osmo is 280    Subjective: slight headache, no nausea, vomiting or dizziness.   Physical Exam: Vitals:   06/28/21 2358 06/29/21 0309 06/29/21 0806 06/29/21 1145  BP: 134/68 122/68 (!) 146/80 122/70  Pulse: 78 74 94 69  Resp: $Remo'16 16  16  'FvafH$ Temp: 98.4 F (36.9 C) 98.2  F (36.8 C) 97.9 F (36.6 C) 97.9 F (36.6 C)  TempSrc: Oral Oral Oral Oral  SpO2: 95% 96% 96% 100%  Weight:      Height:       General exam: Appears calm and comfortable  Respiratory system: Clear to auscultation. Respiratory effort  normal. Cardiovascular system: S1 & S2 heard, RRR. No JVD,  No pedal edema. Gastrointestinal system: Abdomen is nondistended, soft and nontender. Normal bowel sounds heard. Central nervous system: Alert and oriented person and place only. No focal deficits.  Extremities: Symmetric 5 x 5 power. Skin: No rashes, lesions or ulcers Psychiatry: mood okay.     Data Reviewed: Results for orders placed or performed during the hospital encounter of 06/27/21 (from the past 24 hour(s))  Glucose, capillary     Status: Abnormal   Collection Time: 06/28/21  4:43 PM  Result Value Ref Range   Glucose-Capillary 268 (H) 70 - 99 mg/dL  Sodium, urine, random     Status: None   Collection Time: 06/28/21  6:51 PM  Result Value Ref Range   Sodium, Ur 60 mmol/L  Creatinine, urine, random     Status: None   Collection Time: 06/28/21  6:51 PM  Result Value Ref Range   Creatinine, Urine 122.82 mg/dL  Glucose, capillary     Status: Abnormal   Collection Time: 06/28/21  9:19 PM  Result Value Ref Range   Glucose-Capillary 272 (H) 70 - 99 mg/dL  TSH     Status: None   Collection Time: 06/29/21  2:03 AM  Result Value Ref Range   TSH 0.553 0.350 - 4.500 uIU/mL  Cortisol-am, blood     Status: Abnormal   Collection Time: 06/29/21  2:03 AM  Result Value Ref Range   Cortisol - AM 1.3 (L) 6.7 - 22.6 ug/dL  Basic metabolic panel     Status: Abnormal   Collection Time: 06/29/21  2:03 AM  Result Value Ref Range   Sodium 126 (L) 135 - 145 mmol/L   Potassium 4.3 3.5 - 5.1 mmol/L   Chloride 92 (L) 98 - 111 mmol/L   CO2 25 22 - 32 mmol/L   Glucose, Bld 264 (H) 70 - 99 mg/dL   BUN 25 (H) 8 - 23 mg/dL   Creatinine, Ser 0.88 0.61 - 1.24 mg/dL   Calcium 9.2 8.9 - 10.3 mg/dL   GFR, Estimated >60 >60 mL/min   Anion gap 9 5 - 15  Glucose, capillary     Status: Abnormal   Collection Time: 06/29/21  6:04 AM  Result Value Ref Range   Glucose-Capillary 235 (H) 70 - 99 mg/dL  Osmolality     Status: None   Collection  Time: 06/29/21 10:22 AM  Result Value Ref Range   Osmolality 280 275 - 295 mOsm/kg  Glucose, capillary     Status: Abnormal   Collection Time: 06/29/21 11:47 AM  Result Value Ref Range   Glucose-Capillary 218 (H) 70 - 99 mg/dL     Family Communication: none at bedside.   Disposition: Status is: Inpatient Remains inpatient appropriate because: subdural hematoma.           Planned Discharge Destination: Home with Home Health     Time spent: 38 minutes  Author: Hosie Poisson, MD 06/29/2021 3:21 PM  For on call review [email or web address].

## 2021-06-29 NOTE — Evaluation (Addendum)
Physical Therapy Evaluation Patient Details Name: Wyatt [name] MRN: [id number] DOB: 05-Aug-1933 Today's Date: 06/29/2021  History of Present Illness  Patient is a 88 y/o male who presents on 06/27/21 with syncope, dizziness, + LOC and abdominal pain. Found to have pancreatic adenocarcinoma which is new finding, thrombocytopenia and bilateral acute SDH. PMH includes HTN, DM, CAD, thrombocytopenia.  Clinical Impression  Patient presents with impaired balance, impaired cognition and impaired mobility s/p above. Pt lives at home with wife and reports being independent for ADLs/IADLs at baseline. Not sure of accuracy of reported PLOF as pt difficult historian. Today, pt requires Min A without use of DME in standing and Min guard assist using RW for support. Negative orthostatics. Pt reports feeling shaky and not at functional baseline so recommending OPPT to improve overall strength, higher level balance and safe mobility.  Encouraged walking with nursing daily. Will follow acutely.     Recommendations for follow up therapy are one component of a multi-disciplinary discharge planning process, led by the attending physician.  Recommendations may be updated based on patient status, additional functional criteria and insurance authorization.  Follow Up Recommendations Outpatient PT    Assistance Recommended at Discharge Intermittent Supervision/Assistance  Patient can return home with the following  A little help with walking and/or transfers;Direct supervision/assist for financial management;Help with stairs or ramp for entrance;Assist for transportation;Assistance with cooking/housework    Equipment Recommendations None recommended by PT  Recommendations for Other Services       Functional Status Assessment Patient has had a recent decline in their functional status and demonstrates the ability to make significant improvements in function in a reasonable and predictable amount of time.      Precautions / Restrictions Precautions Precautions: Fall Restrictions Weight Bearing Restrictions: No      Mobility  Bed Mobility Overal bed mobility: Needs Assistance Bed Mobility: Supine to Sit     Supine to sit: Supervision, HOB elevated     General bed mobility comments: No assist needed.    Transfers Overall transfer level: Needs assistance Equipment used: Rolling walker (2 wheels), None Transfers: Sit to/from Stand Sit to Stand: Min assist           General transfer comment: Min A to steady in standing without RW; Min guard with RW. Unsteady once upright, No dizziness.    Ambulation/Gait Ambulation/Gait assistance: Min guard Gait Distance (Feet): 300 Feet Assistive device: Rolling walker (2 wheels) Gait Pattern/deviations: Step-through pattern, Decreased stride length, Trunk flexed   Gait velocity interpretation: 1.31 - 2.62 ft/sec, indicative of limited community ambulator   General Gait Details: Slow, steady gait with cues for RW proximity/management as pt tends to walk outside walker legs with turns.  Stairs            Wheelchair Mobility    Modified Rankin (Stroke Patients Only) Modified Rankin (Stroke Patients Only) Pre-Morbid Rankin Score: Slight disability Modified Rankin: Moderately severe disability     Balance Overall balance assessment: Needs assistance Sitting-balance support: Feet supported, No upper extremity supported Sitting balance-Leahy Scale: Good     Standing balance support: During functional activity Standing balance-Leahy Scale: Poor Standing balance comment: Requires external support for standing or UE support                             Pertinent Vitals/Pain Pain Assessment Pain Assessment: Faces Pain Location: back Pain Descriptors / Indicators: Sore Pain Intervention(s): Monitored during session    Home  Living Family/patient expects to be discharged to:: Private residence Living Arrangements:  Spouse/significant other Available Help at Discharge: Family Type of Home: House Home Access: Level entry     Alternate Level Stairs-Number of Steps: 1 flight, man cave in [place]: Laundry or work area in basement;One level Home Equipment: Conservation officer, nature (2 wheels)      Prior Function Prior Level of Function : Independent/Modified Independent             Mobility Comments: INdependent, drives ADLs Comments: Independent     Hand Dominance   Dominant Hand: Right    Extremity/Trunk Assessment   Upper Extremity Assessment Upper Extremity Assessment: Defer to OT evaluation    Lower Extremity Assessment Lower Extremity Assessment: Overall WFL for tasks assessed       Communication   Communication: No difficulties  Cognition Arousal/Alertness: Awake/alert Behavior During Therapy: WFL for tasks assessed/performed Overall Cognitive Status: No family/caregiver present to determine baseline cognitive functioning Area of Impairment: Safety/judgement, Memory, Problem solving                     Memory: Decreased short-term memory   Safety/Judgement: Decreased awareness of safety, Decreased awareness of deficits   Problem Solving: Requires verbal cues General Comments: Difficult historian at times.        General Comments General comments (skin integrity, edema, etc.): Supine BP 140/70, HR 72 bpm, Sitting BP 144/73 HR 74, standing BP 135/75, HR 84, BP post activity 142/76    Exercises     Assessment/Plan    PT Assessment Patient needs continued PT services  PT Problem List Decreased mobility;Decreased safety awareness;Decreased cognition;Decreased balance;Decreased knowledge of use of DME       PT Treatment Interventions Therapeutic exercise;Gait training;DME instruction;Balance training;Stair training;Functional mobility training;Patient/family education;Therapeutic activities;Cognitive remediation;Neuromuscular re-education    PT Goals  (Current goals can be found in the Care Plan section)  Acute Rehab PT Goals Patient Stated Goal: to be able to walk around PT Goal Formulation: With patient Time For Goal Achievement: 07/13/21 Potential to Achieve Goals: Good    Frequency Min 4X/week     Co-evaluation               AM-PAC PT "6 Clicks" Mobility  Outcome Measure Help needed turning from your back to your side while in a flat bed without using bedrails?: A Little Help needed moving from lying on your back to sitting on the side of a flat bed without using bedrails?: A Little Help needed moving to and from a bed to a chair (including a wheelchair)?: A Little Help needed standing up from a chair using your arms (e.g., wheelchair or bedside chair)?: A Little Help needed to walk in hospital room?: A Little Help needed climbing 3-5 steps with a railing? : A Little 6 Click Score: 18    End of Session Equipment Utilized During Treatment: Gait belt Activity Tolerance: Patient tolerated treatment well Patient left: in chair;with call bell/phone within reach;with chair alarm set Nurse Communication: Mobility status PT Visit Diagnosis: Other abnormalities of gait and mobility (R26.89);History of falling (Z91.81)    Time: 9518-8416 PT Time Calculation (min) (ACUTE ONLY): 26 min   Charges:   PT Evaluation $PT Eval Moderate Complexity: 1 Mod PT Treatments $Gait Training: 8-22 mins        Marisa Severin, PT, DPT Acute Rehabilitation Services Pager [phone] Office [place] 06/29/2021, 3:54 PM

## 2021-06-30 ENCOUNTER — Ambulatory Visit (HOSPITAL_COMMUNITY): Payer: Medicare HMO

## 2021-06-30 ENCOUNTER — Telehealth: Payer: Self-pay

## 2021-06-30 LAB — CBC
HCT: 32 % — ABNORMAL LOW (ref 39.0–52.0)
Hemoglobin: 11 g/dL — ABNORMAL LOW (ref 13.0–17.0)
MCH: 28.4 pg (ref 26.0–34.0)
MCHC: 34.4 g/dL (ref 30.0–36.0)
MCV: 82.7 fL (ref 80.0–100.0)
Platelets: 123 10*3/uL — ABNORMAL LOW (ref 150–400)
RBC: 3.87 MIL/uL — ABNORMAL LOW (ref 4.22–5.81)
RDW: 12.6 % (ref 11.5–15.5)
WBC: 8.8 10*3/uL (ref 4.0–10.5)
nRBC: 0 % (ref 0.0–0.2)

## 2021-06-30 LAB — OSMOLALITY, URINE: Osmolality, Ur: 800 mOsm/kg (ref 300–900)

## 2021-06-30 LAB — BASIC METABOLIC PANEL
Anion gap: 11 (ref 5–15)
Anion gap: 9 (ref 5–15)
BUN: 21 mg/dL (ref 8–23)
BUN: 18 mg/dL (ref 8–23)
CO2: 23 mmol/L (ref 22–32)
CO2: 23 mmol/L (ref 22–32)
Calcium: 8.6 mg/dL — ABNORMAL LOW (ref 8.9–10.3)
Calcium: 8.4 mg/dL — ABNORMAL LOW (ref 8.9–10.3)
Chloride: 92 mmol/L — ABNORMAL LOW (ref 98–111)
Chloride: 91 mmol/L — ABNORMAL LOW (ref 98–111)
Creatinine, Ser: 0.69 mg/dL (ref 0.61–1.24)
Creatinine, Ser: 0.85 mg/dL (ref 0.61–1.24)
GFR, Estimated: >60 mL/min (ref >60)
GFR, Estimated: >60 mL/min (ref >60)
Glucose, Bld: 201 mg/dL — ABNORMAL HIGH (ref 70–99)
Glucose, Bld: 284 mg/dL — ABNORMAL HIGH (ref 70–99)
Potassium: 4.2 mmol/L (ref 3.5–5.1)
Potassium: 4 mmol/L (ref 3.5–5.1)
Sodium: 126 mmol/L — ABNORMAL LOW (ref 135–145)
Sodium: 123 mmol/L — ABNORMAL LOW (ref 135–145)

## 2021-06-30 LAB — MAGNESIUM: Magnesium: 1.5 mg/dL — ABNORMAL LOW (ref 1.7–2.4)

## 2021-06-30 LAB — GLUCOSE, CAPILLARY
Glucose-Capillary: 180 mg/dL — ABNORMAL HIGH (ref 70–99)
Glucose-Capillary: 193 mg/dL — ABNORMAL HIGH (ref 70–99)
Glucose-Capillary: 278 mg/dL — ABNORMAL HIGH (ref 70–99)
Glucose-Capillary: 255 mg/dL — ABNORMAL HIGH (ref 70–99)

## 2021-06-30 LAB — SODIUM: Sodium: 127 mmol/L — ABNORMAL LOW (ref 135–145)

## 2021-06-30 LAB — CANCER ANTIGEN 19-9: CA 19-9: 16976 U/mL — ABNORMAL HIGH (ref 0–35)

## 2021-06-30 MED ORDER — MAGNESIUM SULFATE 2 GM/50ML IV SOLN
2.0000 g | Freq: Once | INTRAVENOUS | Status: AC
  Administered 2021-06-30: 2 g via INTRAVENOUS
  Filled 2021-06-30: qty 50

## 2021-06-30 NOTE — Progress Notes (Signed)
Physical Therapy Treatment Patient Details Name: Wyatt [name] MRN: [id number] DOB: 12-21-1933 Today's Date: 06/30/2021   History of Present Illness Patient is a 88 y/o male who presents on 06/27/21 with syncope, dizziness, + LOC and abdominal pain. Found to have pancreatic adenocarcinoma which is new finding, thrombocytopenia and bilateral acute SDH. PMH includes HTN, DM, CAD, thrombocytopenia.    PT Comments    Pt making good progress today.  Session focused on gait, dynamic gait, and standing balance activities.  Pt does still demonstrate mild instabilities - recommending outpt PT.  Pt also requiring increased time for cognitive task.  Continue plan of care.     Recommendations for follow up therapy are one component of a multi-disciplinary discharge planning process, led by the attending physician.  Recommendations may be updated based on patient status, additional functional criteria and insurance authorization.  Follow Up Recommendations  Outpatient PT     Assistance Recommended at Discharge Intermittent Supervision/Assistance  Patient can return home with the following A little help with walking and/or transfers;Direct supervision/assist for financial management;Help with stairs or ramp for entrance;Assist for transportation;Assistance with cooking/housework   Equipment Recommendations  None recommended by PT    Recommendations for Other Services       Precautions / Restrictions Precautions Precautions: Fall     Mobility  Bed Mobility Overal bed mobility: Needs Assistance Bed Mobility: Supine to Sit     Supine to sit: Supervision, HOB elevated          Transfers Overall transfer level: Needs assistance Equipment used: None Transfers: Sit to/from Stand Sit to Stand: Min guard           General transfer comment: performed x 3    Ambulation/Gait Ambulation/Gait assistance: Supervision, Min guard Gait Distance (Feet): 500 Feet Assistive device: None    Gait velocity: decreased     General Gait Details: Min guard progressing to close supervision with gait; with dynamic gait challenges min guard; did note wide BOS with high steps at times but no foot drop   Stairs Stairs: Yes Stairs assistance: Min guard Stair Management: Two rails, Alternating pattern, Forwards Number of Stairs: 5     Wheelchair Mobility    Modified Rankin (Stroke Patients Only) Modified Rankin (Stroke Patients Only) Pre-Morbid Rankin Score: Slight disability Modified Rankin: Moderately severe disability (supervision for gait)     Balance Overall balance assessment: Needs assistance Sitting-balance support: Feet supported, No upper extremity supported Sitting balance-Leahy Scale: Good     Standing balance support: No upper extremity supported Standing balance-Leahy Scale: Good Standing balance comment: no AD to ambulate; performed high level balance activities below               High Level Balance Comments: Walking balance activities with min guard including: looking up/down/L/R, side stepping 5' each direction x 5, forward/backward 5'x5 (slight difficulty with backward but improved with practice.  Standing balance activities including reaching forward 5 inches, leaning into therapist with support removed random intervals 5 x all directions (pt did not have to utilize stepping technique but also not fully leaning into therapist hands), rhomberg EO without difficulty ; rhomberg EC 10-15 sec x 5 with sway; unable to stand on 1 leg            Cognition Arousal/Alertness: Awake/alert Behavior During Therapy: WFL for tasks assessed/performed Overall Cognitive Status: No family/caregiver present to determine baseline cognitive functioning  Problem Solving: Slow processing General Comments: Pt following basic commands well; A and O x 4; had difficulty with cognitive task with ambulation (could only name 1  animal with "c", significantly increased time and cues to say months of year backward)        Exercises      General Comments        Pertinent Vitals/Pain Pain Assessment Pain Assessment: No/denies pain    Home Living                          Prior Function            PT Goals (current goals can now be found in the care plan section) Progress towards PT goals: Progressing toward goals    Frequency    Min 4X/week      PT Plan Current plan remains appropriate    Co-evaluation              AM-PAC PT "6 Clicks" Mobility   Outcome Measure  Help needed turning from your back to your side while in a flat bed without using bedrails?: None Help needed moving from lying on your back to sitting on the side of a flat bed without using bedrails?: None Help needed moving to and from a bed to a chair (including a wheelchair)?: A Little Help needed standing up from a chair using your arms (e.g., wheelchair or bedside chair)?: A Little Help needed to walk in hospital room?: A Little Help needed climbing 3-5 steps with a railing? : A Little 6 Click Score: 20    End of Session Equipment Utilized During Treatment: Gait belt Activity Tolerance: Patient tolerated treatment well Patient left: in chair;with call bell/phone within reach;with chair alarm set Nurse Communication: Mobility status PT Visit Diagnosis: Other abnormalities of gait and mobility (R26.89);History of falling (Z91.81)     Time: 8676-1950 PT Time Calculation (min) (ACUTE ONLY): 23 min  Charges:  $Gait Training: 8-22 mins $Neuromuscular Re-education: 8-22 mins                     Abran Richard, PT Acute Rehab Services Pager [phone] [facility] 06/30/2021, 2:32 PM

## 2021-06-30 NOTE — Evaluation (Signed)
Occupational Therapy Evaluation Patient Details Name: Wyatt [name] MRN: [id number] DOB: [birth year] Today's Date: 06/30/2021   History of Present Illness Patient is a 88 y/o male who presents on 06/27/21 with syncope, dizziness, + LOC and abdominal pain. Found to have pancreatic adenocarcinoma which is new finding, thrombocytopenia and bilateral acute SDH. PMH includes HTN, DM, CAD, thrombocytopenia.   Clinical Impression   Wyatt [name] was evaluated s/p the above admission list, he is generally indep at baseline including all IADLs. Upon evaluation pt was set up for all sitting ADLs and min guard for all OOB ADL and functional mobility without AD. Pt is limited by an unsteady gait, generalized weakness and poor activity tolerance. He will benefit from OT acutely to address the limitations listed below. Anticipate pt progress well acutely without need for follow up OT at d/c.      Recommendations for follow up therapy are one component of a multi-disciplinary discharge planning process, led by the attending physician.  Recommendations may be updated based on patient status, additional functional criteria and insurance authorization.   Follow Up Recommendations  No OT follow up    Assistance Recommended at Discharge Intermittent Supervision/Assistance  Patient can return home with the following Assistance with cooking/housework;Assist for transportation;Help with stairs or ramp for entrance    Functional Status Assessment  Patient has had a recent decline in their functional status and demonstrates the ability to make significant improvements in function in a reasonable and predictable amount of time.  Equipment Recommendations  None recommended by OT       Precautions / Restrictions Precautions Precautions: Fall Restrictions Weight Bearing Restrictions: No      Mobility Bed Mobility Overal bed mobility: Needs Assistance             General bed mobility comments: in chair upon  arrival    Transfers Overall transfer level: Needs assistance Equipment used: None Transfers: Sit to/from Stand Sit to Stand: Min guard                  Balance Overall balance assessment: Needs assistance Sitting-balance support: Feet supported, No upper extremity supported Sitting balance-Leahy Scale: Good     Standing balance support: No upper extremity supported Standing balance-Leahy Scale: Good                             ADL either performed or assessed with clinical judgement   ADL Overall ADL's : Needs assistance/impaired Eating/Feeding: Independent;Sitting   Grooming: Min guard;Standing   Upper Body Bathing: Set up;Sitting   Lower Body Bathing: Min guard;Sit to/from stand   Upper Body Dressing : Set up;Sitting   Lower Body Dressing: Min guard;Sit to/from stand   Toilet Transfer: Min guard;Ambulation   Toileting- Clothing Manipulation and Hygiene: Supervision/safety;Sitting/lateral lean       Functional mobility during ADLs: Min guard General ADL Comments: mildly unsteady on his feet, generalized weakness, poor activity tolerance     Vision Baseline Vision/History: 1 Wears glasses Ability to See in Adequate Light: 0 Adequate Patient Visual Report: No change from baseline Vision Assessment?: No apparent visual deficits Additional Comments: does not have glasses acutely            Pertinent Vitals/Pain Pain Assessment Pain Assessment: No/denies pain Pain Intervention(s): Monitored during session     Hand Dominance Right   Extremity/Trunk Assessment Upper Extremity Assessment Upper Extremity Assessment: Generalized weakness (globally4/5, equal on both sides)   Lower Extremity Assessment  Lower Extremity Assessment: Defer to PT evaluation   Cervical / Trunk Assessment Cervical / Trunk Assessment: Kyphotic (mild)   Communication Communication Communication: No difficulties   Cognition Arousal/Alertness:  Awake/alert Behavior During Therapy: WFL for tasks assessed/performed Overall Cognitive Status: No family/caregiver present to determine baseline cognitive functioning             Problem Solving: Slow processing General Comments: slow processing, couldbe related to [facility]. increased time needed for commands and questions     General Comments  VSS on RA     Home Living Family/patient expects to be discharged to:: Private residence Living Arrangements: Spouse/significant other Available Help at Discharge: Family Type of Home: House Home Access: Level entry     Home Layout: Laundry or work area in basement;One level Alternate Level Stairs-Number of Steps: 1 flight, man cave in basement   [employer] Shower/Tub: Walk-in Human resources officer: Standard     Home Equipment: Agricultural consultant (2 wheels)          Prior Functioning/Environment Prior Level of Function : Independent/Modified Independent             Mobility Comments: INdependent, drives ADLs Comments: Independent        OT Problem List: Decreased strength;Decreased range of motion;Decreased activity tolerance;Impaired balance (sitting and/or standing);Decreased safety awareness;Decreased knowledge of use of DME or AE      OT Treatment/Interventions: Self-care/ADL training;Therapeutic exercise;Therapeutic activities;Patient/family education;Balance training    OT Goals(Current goals can be found in the care plan section) Acute Rehab OT Goals Patient Stated Goal: get stronger OT Goal Formulation: With patient Time For Goal Achievement: 07/14/21 Potential to Achieve Goals: Good ADL Goals Pt Will Transfer to Toilet: Independently;ambulating Pt/caregiver will Perform Home Exercise Program: Increased strength;Both right and left upper extremity;With written HEP provided Additional ADL Goal #1: Pt will indep verbalize at least 3 fall prevention strategies to apply to the home setting  OT Frequency: Min  2X/week       AM-PAC OT "6 Clicks" Daily Activity     Outcome Measure Help from another person eating meals?: None Help from another person taking care of personal grooming?: A Little Help from another person toileting, which includes using toliet, bedpan, or urinal?: A Little Help from another person bathing (including washing, rinsing, drying)?: A Little Help from another person to put on and taking off regular upper body clothing?: None Help from another person to put on and taking off regular lower body clothing?: A Little 6 Click Score: 20   End of Session Equipment Utilized During Treatment: Gait belt Nurse Communication: Mobility status  Activity Tolerance: Patient tolerated treatment well Patient left: in chair;with call bell/phone within reach;with chair alarm set  OT Visit Diagnosis: Unsteadiness on feet (R26.81);Other abnormalities of gait and mobility (R26.89);Muscle weakness (generalized) (M62.81);History of falling (Z91.81)                Time: 1435-1450 OT Time Calculation (min): 15 min Charges:  OT General Charges $OT Visit: 1 Visit OT Evaluation $OT Eval Low Complexity: 1 Low   Jazzmyn Filion A Azarian Starace 06/30/2021, 3:50 PM

## 2021-06-30 NOTE — Telephone Encounter (Signed)
Attempted to reach pt on his mobile phone, no vm is set up.   I called pt's home phone and spoke with his wife. I told her that I will mail a copy of the appt information to their home. Pt's wife confirmed address on file. She verbalized understanding and had no concerns at the end of the call.   Letter mailed to patient with appt information.

## 2021-06-30 NOTE — Progress Notes (Signed)
Progress Note   Patient: Wyatt [name] ZDG:[id number] DOB: [birth year] DOA: 06/27/2021     3 DOS: the patient was seen and examined on 06/30/2021   Brief hospital course: Wyatt [name] is a 88 y.o. male with medical history significant of DM, HTN, HLD, CAD, thrombocytopenia who presents after a syncopal episode at home.  Patient states that he has had right upper quadrant abdominal pain for about 3 months.  on the day of admission. pt  got out of bed and was walking to the restroom when he passed out.  He states that he was out for minutes.  He denies any recent illnesses, no chest pain, shortness of breath, nausea or vomiting.  Denies any gross blood loss from his thrombocytopenia.  He had recently undergone bone marrow biopsy at the end of January for thrombocytopenia work-up. He was found to have sub dural hematoma. Neuro surgery consulted and recommended no surgical intervention.  Hospital course complicated by hyponatremia, which has improved.  Pt was found to have adeno ca on the CT abd and pelvis.IR consulted for omental biopsy scheduled as outpatient.  GI also consulted to see if he needs EUS , in case the omental biopsy does not yield the diagnosis.  Plan to discharge him in the morning with outpatient PT if his sodium improves.    Assessment and Plan: * Subdural hematoma- (present on admission) S/p syncope at home. CT head: Acute right parietal subdural hematoma with maximum thickness of 6 mm. Mild mass effect upon the underlying cerebral hemisphere with approximately 2 mm of midline shift. 2. Tiny left subdural hematoma overlying the inferior left parietal lobe also noted. Neurosurgery consulted, no acute surgical intervention planned but due to his chronic thrombocytopenia, . Plan for [facility] .  Neuro checks. Repeat CT head showed Stable size and appearance of subdural hematomas bilaterally. No new acute findings are noted. Neuro surgery signed off.  Pt reports his headache  has improved.  Therapy eval recommended outpatient PT.   Pancreatic adenocarcinoma ([place])- (present on admission) New finding on CT A/P: Ill-defined low-density mass at the body with upstream pancreatic atrophy, mass roughly 3.1 cm in length. The mass infiltrates the peripancreatic fat, especially inferiorly. Associated splenic vein occlusion with collaterals. IR consulted plan for outpatient omental biopsy. SCHEDULED OUTPATIENT.  Gi requested to see if he is a candidate for EUs. Recommended outpatient follow up.  Patient followed by Dr. Delton Coombes for thrombocytopenia.  EDP discussed with Dr. Delton Coombes over the phone, he will arrange for close follow-up upon patient's discharge from the hospital  Syncope, vasovagal- (present on admission) Fall precaution, orthostatic VS . Therapy evaluations ordered.   Thrombocytopenia ([place])- (present on admission) Followed by Dr. Delton Coombes S/p bone marrow biopsy Jan 2023 , shows miId MDS, in addition hypercellular bone marrow with megakaryocytes.  EDP discussed with Dr. Delton Coombes over the phone, recommended to start Decadron 40 mg daily for thrombocytopenia as well as Protonix for GI protection.   Coronary atherosclerosis- (present on admission) Hold aspirin in setting of SDH and thrombocytopenia. Pt denies any chest pain or sob.   Essential hypertension- (present on admission) Hold lisinopril and lopressor in setting of syncope. Check orthostatic vital signs with therapy evaluations.    HLD (hyperlipidemia)- (present on admission) Zocor   DM (diabetes mellitus) ([place]) Hold metformin SSI  CBG (last 3)  Recent Labs    06/27/21 0950 06/27/21 1730 06/28/21 0801  GLUCAP 208* 190* 285*   Hemoglobin A1c is 7.5%   Hyponatremia:  Unclear etiology. Suspect its from dehydration and poor oral intake.  Low cortisol in am. ? From the decadron injections.  Tsh wnl.  Urine sodium of 60, urine osmo is 800.  Serum osmo is 280. Sodium improving to 127  with IV fluids.     Subjective: headache has much improved.   Physical Exam: Vitals:   06/29/21 2326 06/30/21 0333 06/30/21 0746 06/30/21 1155  BP: 134/71 114/66 121/67 119/68  Pulse: 63 (!) 59 (!) 54 (!) 54  Resp: $Remo'19 19 16 16  'hPyDN$ Temp: 98.2 F (36.8 C) 98.1 F (36.7 C) 98.3 F (36.8 C) 98.1 F (36.7 C)  TempSrc: Oral Oral Oral Oral  SpO2: 97% 97% 96% 100%  Weight:      Height:       General exam: Appears calm and comfortable  Respiratory system: Clear to auscultation. Respiratory effort normal. Cardiovascular system: S1 & S2 heard, RRR. No JVD, No pedal edema. Gastrointestinal system: Abdomen is nondistended, soft and nontender.  Normal bowel sounds heard. Central nervous system: Alert and oriented. No focal neurological deficits. Extremities: Symmetric 5 x 5 power. Skin: No rashes, lesions or ulcers Psychiatry: Mood & affect appropriate.      Data Reviewed: Results for orders placed or performed during the hospital encounter of 06/27/21 (from the past 24 hour(s))  Sodium     Status: Abnormal   Collection Time: 06/29/21  8:31 PM  Result Value Ref Range   Sodium 125 (L) 135 - 145 mmol/L  Glucose, capillary     Status: Abnormal   Collection Time: 06/29/21  9:09 PM  Result Value Ref Range   Glucose-Capillary 275 (H) 70 - 99 mg/dL   Comment 1 Notify RN    Comment 2 Document in Chart   Basic metabolic panel     Status: Abnormal   Collection Time: 06/30/21  3:55 AM  Result Value Ref Range   Sodium 126 (L) 135 - 145 mmol/L   Potassium 4.2 3.5 - 5.1 mmol/L   Chloride 92 (L) 98 - 111 mmol/L   CO2 23 22 - 32 mmol/L   Glucose, Bld 201 (H) 70 - 99 mg/dL   BUN 21 8 - 23 mg/dL   Creatinine, Ser 0.69 0.61 - 1.24 mg/dL   Calcium 8.6 (L) 8.9 - 10.3 mg/dL   GFR, Estimated >60 >60 mL/min   Anion gap 11 5 - 15  CBC     Status: Abnormal   Collection Time: 06/30/21  3:55 AM  Result Value Ref Range   WBC 8.8 4.0 - 10.5 K/uL   RBC 3.87 (L) 4.22 - 5.81 MIL/uL   Hemoglobin 11.0  (L) 13.0 - 17.0 g/dL   HCT 32.0 (L) 39.0 - 52.0 %   MCV 82.7 80.0 - 100.0 fL   MCH 28.4 26.0 - 34.0 pg   MCHC 34.4 30.0 - 36.0 g/dL   RDW 12.6 11.5 - 15.5 %   Platelets 123 (L) 150 - 400 K/uL   nRBC 0.0 0.0 - 0.2 %  Magnesium     Status: Abnormal   Collection Time: 06/30/21  3:55 AM  Result Value Ref Range   Magnesium 1.5 (L) 1.7 - 2.4 mg/dL  Sodium     Status: Abnormal   Collection Time: 06/30/21  3:55 AM  Result Value Ref Range   Sodium 127 (L) 135 - 145 mmol/L  Glucose, capillary     Status: Abnormal   Collection Time: 06/30/21  8:40 AM  Result Value Ref Range   Glucose-Capillary  180 (H) 70 - 99 mg/dL  Osmolality, urine     Status: None   Collection Time: 06/30/21  8:51 AM  Result Value Ref Range   Osmolality, Ur 800 300 - 900 mOsm/kg  Glucose, capillary     Status: Abnormal   Collection Time: 06/30/21  1:02 PM  Result Value Ref Range   Glucose-Capillary 193 (H) 70 - 99 mg/dL  Glucose, capillary     Status: Abnormal   Collection Time: 06/30/21  5:10 PM  Result Value Ref Range   Glucose-Capillary 278 (H) 70 - 99 mg/dL     Family Communication: none at bedside.   Disposition: Status is: Inpatient Remains inpatient appropriate because: subdural hematoma.           Planned Discharge Destination: Home with Home Health    Time spent: 42 minutes.   Author: Hosie Poisson, MD 06/30/2021 6:20 PM  For on call review [email or web address].

## 2021-06-30 NOTE — Care Management Important Message (Signed)
Important Message  Patient Details  Name: Wyatt [name] MRN: [id number] Date of Birth: 03-25-1934   Medicare Important Message Given:  Yes     Orbie Pyo 06/30/2021, 1:46 PM

## 2021-06-30 NOTE — Telephone Encounter (Signed)
-----   Message from Levin Erp, [place] sent at 06/29/2021  4:04 PM EST ----- Regarding: Follow up visit Needs ov with jacobs or mansouraty in 4 weeks to discuss eus.  Thanks-JLL

## 2021-07-01 DIAGNOSIS — R55 Syncope and collapse: Secondary | ICD-10-CM

## 2021-07-01 DIAGNOSIS — C259 Malignant neoplasm of pancreas, unspecified: Secondary | ICD-10-CM

## 2021-07-01 DIAGNOSIS — E785 Hyperlipidemia, unspecified: Secondary | ICD-10-CM

## 2021-07-01 DIAGNOSIS — D696 Thrombocytopenia, unspecified: Secondary | ICD-10-CM

## 2021-07-01 DIAGNOSIS — E871 Hypo-osmolality and hyponatremia: Secondary | ICD-10-CM

## 2021-07-01 LAB — BASIC METABOLIC PANEL
Anion gap: 9 (ref 5–15)
BUN: 19 mg/dL (ref 8–23)
CO2: 26 mmol/L (ref 22–32)
Calcium: 8.5 mg/dL — ABNORMAL LOW (ref 8.9–10.3)
Chloride: 93 mmol/L — ABNORMAL LOW (ref 98–111)
Creatinine, Ser: 0.9 mg/dL (ref 0.61–1.24)
GFR, Estimated: >60 mL/min (ref >60)
Glucose, Bld: 187 mg/dL — ABNORMAL HIGH (ref 70–99)
Potassium: 3.5 mmol/L (ref 3.5–5.1)
Sodium: 128 mmol/L — ABNORMAL LOW (ref 135–145)

## 2021-07-01 LAB — URINALYSIS, COMPLETE (UACMP) WITH MICROSCOPIC
Bilirubin Urine: NEGATIVE
Glucose, UA: 150 mg/dL — AB
Ketones, ur: NEGATIVE mg/dL
Leukocytes,Ua: NEGATIVE
Nitrite: NEGATIVE
Protein, ur: NEGATIVE mg/dL
Specific Gravity, Urine: 1.005 (ref 1.005–1.030)
pH: 5 (ref 5.0–8.0)

## 2021-07-01 LAB — GLUCOSE, CAPILLARY
Glucose-Capillary: 241 mg/dL — ABNORMAL HIGH (ref 70–99)
Glucose-Capillary: 250 mg/dL — ABNORMAL HIGH (ref 70–99)
Glucose-Capillary: 170 mg/dL — ABNORMAL HIGH (ref 70–99)
Glucose-Capillary: 173 mg/dL — ABNORMAL HIGH (ref 70–99)

## 2021-07-01 LAB — SODIUM, URINE, RANDOM: Sodium, Ur: 107 mmol/L

## 2021-07-01 LAB — OSMOLALITY, URINE: Osmolality, Ur: 278 mOsm/kg — ABNORMAL LOW (ref 300–900)

## 2021-07-01 MED ORDER — FUROSEMIDE 10 MG/ML IJ SOLN
20.0000 mg | Freq: Once | INTRAMUSCULAR | Status: AC
  Administered 2021-07-01: 20 mg via INTRAVENOUS
  Filled 2021-07-01: qty 4

## 2021-07-01 NOTE — Progress Notes (Signed)
Progress Note  Patient: Wyatt [name] SKA:[id number] DOB: 01/20/1934  DOA: 06/27/2021  DOS: 07/01/2021    Brief hospital course: Wyatt [name] is a 88 y.o. male with medical history significant of DM, HTN, HLD, CAD, thrombocytopenia who presents after a syncopal episode at home.  Patient states that he has had right upper quadrant abdominal pain for about 3 months.  on the day of admission. pt  got out of bed and was walking to the restroom when he passed out.  He states that he was out for minutes.  He denies any recent illnesses, no chest pain, shortness of breath, nausea or vomiting.  Denies any gross blood loss from his thrombocytopenia.  He had recently undergone bone marrow biopsy at the end of January for thrombocytopenia work-up. He was found to have sub dural hematoma. Neuro surgery consulted and recommended no surgical intervention.  Hospital course complicated by hyponatremia, which has worsened. Pt was found to have adeno ca on the CT abd and pelvis.IR consulted for omental biopsy scheduled as outpatient.  GI also consulted to see if he needs EUS, in case the omental biopsy does not yield the diagnosis.  Plan to discharge him in the morning with outpatient PT if his sodium improves.  Assessment and Plan: * Subdural hematoma- (present on admission) S/p syncope at home. CT head: Acute right parietal subdural hematoma with maximum thickness of 6 mm. Mild mass effect upon the underlying cerebral hemisphere with approximately 2 mm of midline shift. Tiny left subdural hematoma overlying the inferior left parietal lobe also noted. Repeat CT demonstrated stable size and appearance of subdural hematomas bilaterally. No new acute findings are noted. - Neurosurgery, Dr. Arnoldo Morale, evaluated the patient, recommends against any neurosurgical intervention, plans to follow up as outpatient.  - Avoid antiplatelet/anticoagulation.   Hyponatremia With inappropriately elevated urine sodium and  osmolality. Worsened with isotonic IVF. 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 TSH wnl.  - Give lasix, initiate fluid restriction, consider salt tabs if Na not improving. D/w nephrology, Dr. Carolin Sicks.  - Adrenal insufficiency also on the differential as patient was given high dose decadron, though no other symptoms/signs at this time. Will defer empiric hydrocortisone, ACTH stim test unlikely to be accurate.  - Pt was given DDAVP due to thrombocytopenia and SDH on admission which may be contributing as well.   Pancreatic adenocarcinoma ([place])- (present on admission) New finding on CT A/P: Ill-defined low-density mass at the body with upstream pancreatic atrophy, mass roughly 3.1 cm in length. The mass infiltrates the peripancreatic fat, especially inferiorly. Associated splenic vein occlusion with collaterals. - Not biopsy-proven yet. IR was consulted and will perform omental biopsy as an outpatient.  - Dr. Delton Coombes aware of admission and will arrange for close follow-up upon patient's discharge from the hospital  Syncope, vasovagal- (present on admission) Fall precautions. Therapy has recommended outpatient PT which will be in [place].   Thrombocytopenia ([place])- (present on admission) Followed by Dr. Delton Coombes s/p bone marrow biopsy Jan 2023.  - Completed 4 days of decadron 74m daily with PPI for GI ppx. Platelet count has subsequently improved.  - Continue outpatient follow up with Dr. KDelton Coombeswho can also assist with suspected cancer.  Coronary atherosclerosis- (present on admission) Hold aspirin in setting of SDH and thrombocytopenia. Pt denies any chest pain or sob.   Essential hypertension- (present on admission) Hold lisinopril and lopressor in setting of syncope. Check orthostatic vital signs.  HLD (hyperlipidemia)- (present on admission) Zocor   DM (diabetes mellitus) ([place]) Hold metformin SSI  CBG  (last 3)  Recent Labs    06/27/21 0950 06/27/21 1730 06/28/21 0801  GLUCAP 208* 190* 285*   Hemoglobin A1c is 75%   Subjective: OOB this morning, reporting some bilateral lower back pain that started this morning. Suspects it's from laying in the bed much more than he normally does, feels restricted by precautions, wants to go home. Urine output robust since lasix this morning. No HA, seizure, vision changes, dizziness, syncope. Tolerating po.   Objective: Vitals:   06/30/21 2343 07/01/21 0329 07/01/21 0856 07/01/21 1140  BP: 136/66 120/68 128/70 114/89  Pulse: (!) 50 (!) 56 69 62  Resp: _0 Temp: 98.2 F (36.8 C) 98.3 F (36.8 C) 97.7 F (36.5 C) 98.2 F (36.8 C)  TempSrc: Oral Oral Oral Oral  SpO2: 100% 97% 100% 99%  Weight:      Height:       Gen: Pleasant 88 y.o. male in no distress Pulm: Nonlabored breathing room air.  CV: Regular rate and rhythm. No murmur, rub, or gallop. No JVD, no dependent edema. GI: Abdomen soft, non-tender, non-distended, with normoactive bowel sounds.  Ext: Warm, no deformities Skin: No rashes, lesions or ulcers on visualized skin. Neuro: Alert and oriented. No focal neurological deficits. Psych: Judgement and insight appear fair. Mood euthymic & affect congruent. Behavior is appropriate.    Data Personally reviewed:  CBC: Recent Labs  Lab 06/27/21 1021 06/28/21 0228 06/28/21 1020 06/30/21 0355  WBC 7.5 4.8 8.6 8.8  NEUTROABS 6.1  --  7.5  --   HGB 12.7* 12.2* 12.4* 11.0*  HCT 38.0* 36.1* 35.9* 32.0*  MCV 85.6 83.6 82.7 82.7  PLT 50* 53* 69* 841*   Basic Metabolic Panel: Recent Labs  Lab 06/28/21 0228 06/28/21 1020 06/29/21 0203 06/29/21 2031 06/30/21 0355 06/30/21 1811  NA 127* 128* 126* 125* 127*   126* 123*  K 4.1 4.2 4.3  --  4.2 4.0  CL 91* 91* 92*  --  92* 91*  CO2 _1 --  23 23  GLUCOSE 264* 264* 264*  --  201* 284*  BUN 13 16 25*  --  21 18  CREATININE 0.70 0.75 0.88  --  0.69 0.85  CALCIUM 8.9  9.2 9.2  --  8.6* 8.4*  MG  --   --   --   --  1.5*  --    GFR: Estimated Creatinine Clearance: 58.3 mL/min (by C-G formula based on SCr of 0.85 mg/dL). Liver Function Tests: Recent Labs  Lab 06/27/21 1021  AST 19  ALT 20  ALKPHOS 62  BILITOT 1.1  PROT 6.3*  ALBUMIN 3.6   No results for input(s): LIPASE, AMYLASE in the last 168 hours. No results for input(s): AMMONIA in the last 168 hours. Coagulation Profile: No results for input(s): INR, PROTIME in the last 168 hours. Cardiac Enzymes: No results for input(s): CKTOTAL, CKMB, CKMBINDEX, TROPONINI in the last 168 hours. BNP (last 3 results) No results for input(s): PROBNP in the last 8760 hours. HbA1C: No results for input(s): HGBA1C in the last 72 hours. CBG: Recent Labs  Lab 06/30/21 1302 06/30/21 1710 06/30/21 2115 07/01/21 0859 07/01/21 1136  GLUCAP 193* 278* 255* 241* 250*   Lipid Profile: No results for input(s): CHOL, HDL, LDLCALC, TRIG, CHOLHDL, LDLDIRECT in the last 72 hours. Thyroid Function Tests: Recent Labs    06/29/21 0203  TSH 0.553   Anemia Panel: No results for input(s): VITAMINB12, FOLATE, FERRITIN, TIBC, IRON, RETICCTPCT in the last 72 hours. Urine analysis:    Component Value Date/Time   COLORURINE COLORLESS (A) 07/01/2021 1054   APPEARANCEUR CLEAR 07/01/2021 1054   LABSPEC 1.005 07/01/2021 1054   PHURINE 5.0 07/01/2021 1054   GLUCOSEU 150 (A) 07/01/2021 1054   HGBUR MODERATE (A) 07/01/2021 1054   BILIRUBINUR NEGATIVE 07/01/2021 1054   KETONESUR NEGATIVE 07/01/2021 1054   PROTEINUR NEGATIVE 07/01/2021 1054   UROBILINOGEN 0.2 06/23/2012 1915   NITRITE NEGATIVE 07/01/2021 1054   LEUKOCYTESUR NEGATIVE 07/01/2021 1054   Recent Results (from the past 240 hour(s))  Resp Panel by RT-PCR (Flu A&B, Covid) Nasopharyngeal Swab     Status: None   Collection Time: 06/27/21  1:14 PM   Specimen: Nasopharyngeal Swab; Nasopharyngeal(NP) swabs in vial transport medium  Result Value Ref Range Status    SARS Coronavirus 2 by RT PCR NEGATIVE NEGATIVE Final    Comment: (NOTE) SARS-CoV-2 target nucleic acids are NOT DETECTED.  The SARS-CoV-2 RNA is generally detectable in upper respiratory specimens during the acute phase of infection. The lowest concentration of SARS-CoV-2 viral copies this assay can detect is 138 copies/mL. A negative result does not preclude SARS-Cov-2 infection and should not be used as the sole basis for treatment or other patient management decisions. A negative result may occur with  improper specimen collection/handling, submission of specimen other than nasopharyngeal swab, presence of viral mutation(s) within the areas targeted by this assay, and inadequate number of viral copies(<138 copies/mL). A negative result must be combined with clinical observations, patient history, and epidemiological information. The expected result is Negative.  Fact Sheet for Patients:  EntrepreneurPulse.com.au  Fact Sheet for Healthcare Providers:  IncredibleEmployment.be  This test is no t yet approved or cleared by the [place] FDA and  has been authorized for detection and/or diagnosis of SARS-CoV-2 by FDA under an Emergency Use Authorization (EUA). This EUA will remain  in effect (meaning this test can be used) for the duration of the COVID-19 declaration under Section 564(b)(1) of the Act, 21 U.S.C.section 360bbb-3(b)(1), unless the authorization is terminated  or revoked sooner.       Influenza A by PCR NEGATIVE NEGATIVE Final   Influenza B by PCR NEGATIVE NEGATIVE Final    Comment: (NOTE) The Xpert Xpress SARS-CoV-2/FLU/RSV plus assay is intended as an aid in the diagnosis of influenza from Nasopharyngeal swab specimens and should not be used as a sole basis for treatment. Nasal washings and aspirates are unacceptable for Xpert Xpress SARS-CoV-2/FLU/RSV testing.  Fact Sheet for  Patients: EntrepreneurPulse.com.au  Fact Sheet for Healthcare Providers: IncredibleEmployment.be  This test is not yet approved or cleared by the [place] FDA and has been authorized for detection and/or diagnosis of SARS-CoV-2 by FDA under an Emergency Use Authorization (EUA). This EUA will remain in effect (meaning this test can be used) for the duration of the COVID-19 declaration under Section 564(b)(1) of the Act, 21 U.S.C. section 360bbb-3(b)(1), unless the authorization is terminated or revoked.  Performed at [facility], [address], [place], [place] [address]      No results found.   Family Communication: None at bedside  Disposition: Status is: Inpatient Remains inpatient appropriate because: persistent and worsening hyponatremia Planned Discharge Destination: Home  Patrecia Pour, MD 07/01/2021 2:45 PM Page by [email or web address]

## 2021-07-01 NOTE — Assessment & Plan Note (Addendum)
With inappropriately elevated urine sodium and osmolality. Worsened with isotonic IVF. 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 TSH wnl.  - Sodium improved 2/15 after lasix but has not sustained improvement on fluid restriction alone. In fact, has reworsened. Will repeat lasix, renal function stable, and start salt tabs 1g TID. D/w nephrology, Dr. Carolin Sicks who does not recommend discharge at this time. - Adrenal insufficiency also on the differential as patient was given high dose decadron, though no other symptoms/signs at this time. Will defer empiric hydrocortisone, ACTH stim test unlikely to be accurate.  - Pt was given DDAVP due to thrombocytopenia and SDH on admission which may be contributing as well.

## 2021-07-02 ENCOUNTER — Ambulatory Visit (HOSPITAL_COMMUNITY): Payer: Medicare HMO | Admitting: Hematology

## 2021-07-02 LAB — BASIC METABOLIC PANEL
Anion gap: 8 (ref 5–15)
BUN: 21 mg/dL (ref 8–23)
CO2: 24 mmol/L (ref 22–32)
Calcium: 7.9 mg/dL — ABNORMAL LOW (ref 8.9–10.3)
Chloride: 93 mmol/L — ABNORMAL LOW (ref 98–111)
Creatinine, Ser: 0.78 mg/dL (ref 0.61–1.24)
GFR, Estimated: >60 mL/min (ref >60)
Glucose, Bld: 158 mg/dL — ABNORMAL HIGH (ref 70–99)
Potassium: 3.4 mmol/L — ABNORMAL LOW (ref 3.5–5.1)
Sodium: 125 mmol/L — ABNORMAL LOW (ref 135–145)

## 2021-07-02 LAB — GLUCOSE, CAPILLARY
Glucose-Capillary: 155 mg/dL — ABNORMAL HIGH (ref 70–99)
Glucose-Capillary: 210 mg/dL — ABNORMAL HIGH (ref 70–99)
Glucose-Capillary: 156 mg/dL — ABNORMAL HIGH (ref 70–99)
Glucose-Capillary: 148 mg/dL — ABNORMAL HIGH (ref 70–99)

## 2021-07-02 MED ORDER — SODIUM CHLORIDE 1 G PO TABS
1.0000 g | ORAL_TABLET | Freq: Three times a day (TID) | ORAL | Status: DC
  Administered 2021-07-02 – 2021-07-03 (×5): 1 g via ORAL
  Filled 2021-07-02 (×6): qty 1

## 2021-07-02 MED ORDER — FUROSEMIDE 10 MG/ML IJ SOLN
20.0000 mg | Freq: Once | INTRAMUSCULAR | Status: AC
  Administered 2021-07-02: 20 mg via INTRAVENOUS
  Filled 2021-07-02: qty 4

## 2021-07-02 NOTE — Progress Notes (Signed)
Physical Therapy Treatment Patient Details Name: Wyatt [name] MRN: [id number] DOB: 05/15/1934 Today's Date: 07/02/2021   History of Present Illness Patient is a 88 y/o male who presents on 06/27/21 with syncope, dizziness, + LOC and abdominal pain. Found to have pancreatic adenocarcinoma which is new finding, thrombocytopenia and bilateral acute SDH. PMH includes HTN, DM, CAD, thrombocytopenia.    PT Comments    Focus of session today functional transfers, gait, and high level balance challenges. The patient tolerated well and reports no increase in pain or fatigue.  Pt. Shows overall improvement with functional transfers, processing, ambulation, and response to balance challenges. Overall endurance, maximal strength, and response to high level balance demands are still limiting function. Pt. Would benefit from skilled PT to continue to address these deficits to improve safety and ambulation capacity. Plan and discharge setting remains unchanged. Pt to follow acutely as appropriate.     Recommendations for follow up therapy are one component of a multi-disciplinary discharge planning process, led by the attending physician.  Recommendations may be updated based on patient status, additional functional criteria and insurance authorization.  Follow Up Recommendations  Home health PT     Assistance Recommended at Discharge PRN  Patient can return home with the following A little help with walking and/or transfers;Direct supervision/assist for financial management;Help with stairs or ramp for entrance;Assist for transportation;Assistance with cooking/housework   Equipment Recommendations  None recommended by PT    Recommendations for Other Services       Precautions / Restrictions Precautions Precautions: Fall Restrictions Weight Bearing Restrictions: No     Mobility  Bed Mobility Overal bed mobility: Needs Assistance             General bed mobility comments: in chair upon  arrival    Transfers Overall transfer level: Needs assistance Equipment used: None, Rolling walker (2 wheels) Transfers: Sit to/from Stand Sit to Stand: Min guard           General transfer comment: perfromed x9, 3 at lowered bed height. Cues to quickly stand back up once touching bed for increased muscle activation    Ambulation/Gait Ambulation/Gait assistance: Supervision, Min guard Gait Distance (Feet): 100 Feet Assistive device: None, Rolling walker (2 wheels) Gait Pattern/deviations: Step-through pattern, Trunk flexed Gait velocity: decreased     General Gait Details: Min guard during balance challenges, and stair navigation. Pt. took walker when he first started walking but quickly progressed to not using it.   Stairs Stairs: Yes Stairs assistance: Min guard Stair Management: One rail Right, Alternating pattern Number of Stairs: 10 General stair comments: Pt. able to alternate steps using one rail when cued to. He did step to on the way down holding onto a rail on the right.   Wheelchair Mobility    Modified Rankin (Stroke Patients Only) Modified Rankin (Stroke Patients Only) Pre-Morbid Rankin Score: Slight disability Modified Rankin: Moderate disability     Balance Overall balance assessment: Needs assistance Sitting-balance support: Feet supported, No upper extremity supported Sitting balance-Leahy Scale: Good     Standing balance support: No upper extremity supported Standing balance-Leahy Scale: Good Standing balance comment: no AD to ambulate; performed high level balance activities below               High Level Balance Comments: Up/dwn 6" step x12, obstacle course (up/dwn small flight steps, around/over cups, over small step block, up/over cups), Standing catch/throw with PT perturbations            Cognition Arousal/Alertness:  Awake/alert Behavior During Therapy: WFL for tasks assessed/performed Overall Cognitive Status: No  family/caregiver present to determine baseline cognitive functioning                                 General Comments: HOH, able to process multi-step commands and follow all directions        Exercises      General Comments General comments (skin integrity, edema, etc.): VSS on RA      Pertinent Vitals/Pain Pain Assessment Pain Assessment: No/denies pain    Home Living                          Prior Function            PT Goals (current goals can now be found in the care plan section) Acute Rehab PT Goals Patient Stated Goal: to be able to walk around PT Goal Formulation: With patient Time For Goal Achievement: 07/13/21 Potential to Achieve Goals: Good Progress towards PT goals: Progressing toward goals    Frequency    Min 4X/week      PT Plan Current plan remains appropriate    Co-evaluation              AM-PAC PT "6 Clicks" Mobility   Outcome Measure  Help needed turning from your back to your side while in a flat bed without using bedrails?: None Help needed moving from lying on your back to sitting on the side of a flat bed without using bedrails?: None Help needed moving to and from a bed to a chair (including a wheelchair)?: None Help needed standing up from a chair using your arms (e.g., wheelchair or bedside chair)?: A Little Help needed to walk in hospital room?: A Little Help needed climbing 3-5 steps with a railing? : A Little 6 Click Score: 21    End of Session Equipment Utilized During Treatment: Gait belt Activity Tolerance: Patient tolerated treatment well Patient left: in bed;with bed alarm set;with call bell/phone within reach Nurse Communication: Mobility status PT Visit Diagnosis: Other abnormalities of gait and mobility (R26.89);History of falling (Z91.81)     Time: 2119-4174 PT Time Calculation (min) (ACUTE ONLY): 21 min  Charges:  $Neuromuscular Re-education: 8-22 mins                      Thermon Leyland, SPT Acute Rehab Services    Thermon Leyland 07/02/2021, 4:26 PM

## 2021-07-02 NOTE — TOC Progression Note (Signed)
Transition of Care [facility]) - Progression Note    Patient Details  Name: Wyatt [name] MRN: [id number] Date of Birth: Oct 06, 1933  [facility] CM/SW Contact  Pollie Friar, RN Phone Number: 07/02/2021, 11:20 AM  Clinical Narrative:    With patients permission CM spoke to patients granddaughter over the phone. She is a Therapist, sports here at the hospital. She is asking to have home health arranged instead of outpatient due to patients limited transportation after discharge. Granddaughter without a preference for home health agency after being provided choice with [email or web address] CM was able to get home health arranged through [facility]. Information on the AVS.  TOC following for further d/c needs.    Expected Discharge Plan: [place] Barriers to Discharge: Continued Medical Work up  Expected Discharge Plan and Services Expected Discharge Plan: [place]   Discharge Planning Services: CM Consult Post Acute Care Choice: [place] arrangements for the past 2 months: Single Family Home                           HH Arranged: PT, RN [facility] Agency: [place] Date [place]: 07/02/21   Representative spoke with at [place]: [place] ([place]) Interventions    Readmission Risk Interventions No flowsheet data found.

## 2021-07-02 NOTE — Progress Notes (Signed)
Progress Note  Patient: Wyatt [name] FMB:[id number] DOB: 06-02-1933  DOA: 06/27/2021  DOS: 07/02/2021    Brief hospital course: Wyatt [name] is a 88 y.o. male with medical history significant of DM, HTN, HLD, CAD, thrombocytopenia who presents after a syncopal episode at home.  Patient states that he has had right upper quadrant abdominal pain for about 3 months.  on the day of admission. pt  got out of bed and was walking to the restroom when he passed out.  He states that he was out for minutes.  He denies any recent illnesses, no chest pain, shortness of breath, nausea or vomiting.  Denies any gross blood loss from his thrombocytopenia.  He had recently undergone bone marrow biopsy at the end of January for thrombocytopenia work-up. He was found to have sub dural hematoma. Neuro surgery consulted and recommended no surgical intervention.  Hospital course complicated by hyponatremia, which has worsened. Pt was found to have adeno ca on the CT abd and pelvis.IR consulted for omental biopsy scheduled as outpatient.  GI also consulted to see if he needs EUS, in case the omental biopsy does not yield the diagnosis.   Hyponatremia worsened with isotonic saline and labs consistent with SIADH prompted treatment with fluid restriction, low dose lasix, and addition of salt tabs.   Assessment and Plan: * Subdural hematoma- (present on admission) S/p syncope at home. CT head: Acute right parietal subdural hematoma with maximum thickness of 6 mm. Mild mass effect upon the underlying cerebral hemisphere with approximately 2 mm of midline shift. Tiny left subdural hematoma overlying the inferior left parietal lobe also noted. Repeat CT demonstrated stable size and appearance of subdural hematomas bilaterally. No new acute findings are noted. - Neurosurgery, Dr. Arnoldo Morale, evaluated the patient, recommends against any neurosurgical intervention, plans to follow up as outpatient.  - Avoid  antiplatelet/anticoagulation.   Hyponatremia With inappropriately elevated urine sodium and osmolality. Worsened with isotonic IVF. 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 TSH wnl.  - Sodium improved 2/15 after lasix but has not sustained improvement on fluid restriction alone. In fact, has reworsened. Will repeat lasix, renal function stable, and start salt tabs 1g TID. D/w nephrology, Dr. Carolin Sicks who does not recommend discharge at this time. - Adrenal insufficiency also on the differential as patient was given high dose decadron, though no other symptoms/signs at this time. Will defer empiric hydrocortisone, ACTH stim test unlikely to be accurate.  - Pt was given DDAVP due to thrombocytopenia and SDH on admission which may be contributing as well.   Pancreatic adenocarcinoma (Wyatt [name])- (present on admission) New finding on CT A/P: Ill-defined low-density mass at the body with upstream pancreatic atrophy, mass roughly 3.1 cm in length. The mass infiltrates the peripancreatic fat, especially inferiorly. Associated splenic vein occlusion with collaterals. - Not biopsy-proven yet, though tumor marker severely elevated. IR was consulted and will perform omental biopsy as an outpatient.  - Dr. Delton Coombes aware of admission. Had follow up scheduled this afternoon which the patient will not make. I called to make Dr. Delton Coombes aware of need to reschedule, no answer, left secure chat as well, waiting to hear back.   Syncope, vasovagal- (present on admission) Fall precautions. Therapy has recommended outpatient PT which will be in [place].   Thrombocytopenia (Wyatt [name])- (present on admission) Followed by Dr. Delton Coombes s/p bone marrow biopsy Jan 2023.  - Completed 4 days of decadron $RemoveBef'40mg'eVeUJQNeUS$  daily with PPI for  GI ppx. Platelet count has subsequently improved.  - Continue outpatient follow up with Dr. Delton Coombes who can also assist with  suspected cancer.  Coronary atherosclerosis- (present on admission) Hold aspirin in setting of SDH and thrombocytopenia. Pt denies any chest pain or sob.   Essential hypertension- (present on admission) Will continue holding lisinopril and lopressor in setting of syncope and normotension.  HLD (hyperlipidemia)- (present on admission) Continue simvastatin. LFTs wnl.  DM (diabetes mellitus) ([place]) HbA1c 7.5%. Will continue to hold metformin - SSI.   Subjective: No new complaints, specifically no dyspnea, cough, HA, vision changes, tremor. Eating ok.  Objective: Vitals:   07/01/21 2017 07/01/21 2344 07/02/21 0342 07/02/21 0826  BP: 121/69 (!) 123/59 (!) 144/72 132/79  Pulse: 66 65 68 72  Resp: $Remo'18 17 17 18  'hhvEI$ Temp: 98.2 F (36.8 C) 98.5 F (36.9 C) 99 F (37.2 C) 98.8 F (37.1 C)  TempSrc: Oral Oral Oral Oral  SpO2: 100% 96% 98% 97%  Weight:      Height:       Gen: 88 y.o. male in no distress Pulm: Nonlabored breathing room air. Clear. CV: Regular rate and rhythm. No murmur, rub, or gallop. No JVD, no dependent edema. GI: Abdomen soft, non-tender, non-distended, with normoactive bowel sounds.  Ext: Warm, no deformities Skin: No rashes, lesions or ulcers on visualized skin. Neuro: Alert and oriented. No focal neurological deficits. Psych: Judgement and insight appear fair. Mood euthymic & affect congruent. Behavior is appropriate.    Data Personally reviewed:  CBC: Recent Labs  Lab 06/27/21 1021 06/28/21 0228 06/28/21 1020 06/30/21 0355  WBC 7.5 4.8 8.6 8.8  NEUTROABS 6.1  --  7.5  --   HGB 12.7* 12.2* 12.4* 11.0*  HCT 38.0* 36.1* 35.9* 32.0*  MCV 85.6 83.6 82.7 82.7  PLT 50* 53* 69* 811*   Basic Metabolic Panel: Recent Labs  Lab 06/29/21 0203 06/29/21 2031 06/30/21 0355 06/30/21 1811 07/01/21 1522 07/02/21 0310  NA 126* 125* 127*   126* 123* 128* 125*  K 4.3  --  4.2 4.0 3.5 3.4*  CL 92*  --  92* 91* 93* 93*  CO2 25  --  $R'23 23 26 24  'nH$ GLUCOSE 264*  --   201* 284* 187* 158*  BUN 25*  --  $R'21 18 19 21  'VQ$ CREATININE 0.88  --  0.69 0.85 0.90 0.78  CALCIUM 9.2  --  8.6* 8.4* 8.5* 7.9*  MG  --   --  1.5*  --   --   --    GFR: Estimated Creatinine Clearance: 62 mL/min (by C-G formula based on SCr of 0.78 mg/dL). Liver Function Tests: Recent Labs  Lab 06/27/21 1021  AST 19  ALT 20  ALKPHOS 62  BILITOT 1.1  PROT 6.3*  ALBUMIN 3.6   No results for input(s): LIPASE, AMYLASE in the last 168 hours. No results for input(s): AMMONIA in the last 168 hours. Coagulation Profile: No results for input(s): INR, PROTIME in the last 168 hours. Cardiac Enzymes: No results for input(s): CKTOTAL, CKMB, CKMBINDEX, TROPONINI in the last 168 hours. BNP (last 3 results) No results for input(s): PROBNP in the last 8760 hours. HbA1C: No results for input(s): HGBA1C in the last 72 hours. CBG: Recent Labs  Lab 07/01/21 0859 07/01/21 1136 07/01/21 1659 07/01/21 2128 07/02/21 0618  GLUCAP 241* 250* 170* 173* 155*   Lipid Profile: No results for input(s): CHOL, HDL, LDLCALC, TRIG, CHOLHDL, LDLDIRECT in the last 72 hours. Thyroid Function Tests: No  results for input(s): TSH, T4TOTAL, FREET4, T3FREE, THYROIDAB in the last 72 hours.  Anemia Panel: No results for input(s): VITAMINB12, FOLATE, FERRITIN, TIBC, IRON, RETICCTPCT in the last 72 hours. Urine analysis:    Component Value Date/Time   COLORURINE COLORLESS (A) 07/01/2021 1054   APPEARANCEUR CLEAR 07/01/2021 1054   LABSPEC 1.005 07/01/2021 1054   PHURINE 5.0 07/01/2021 1054   GLUCOSEU 150 (A) 07/01/2021 1054   HGBUR MODERATE (A) 07/01/2021 1054   BILIRUBINUR NEGATIVE 07/01/2021 1054   KETONESUR NEGATIVE 07/01/2021 1054   PROTEINUR NEGATIVE 07/01/2021 1054   UROBILINOGEN 0.2 06/23/2012 1915   NITRITE NEGATIVE 07/01/2021 1054   LEUKOCYTESUR NEGATIVE 07/01/2021 1054   Recent Results (from the past 240 hour(s))  Resp Panel by RT-PCR (Flu A&B, Covid) Nasopharyngeal Swab     Status: None    Collection Time: 06/27/21  1:14 PM   Specimen: Nasopharyngeal Swab; Nasopharyngeal(NP) swabs in vial transport medium  Result Value Ref Range Status   SARS Coronavirus 2 by RT PCR NEGATIVE NEGATIVE Final    Comment: (NOTE) SARS-CoV-2 target nucleic acids are NOT DETECTED.  The SARS-CoV-2 RNA is generally detectable in upper respiratory specimens during the acute phase of infection. The lowest concentration of SARS-CoV-2 viral copies this assay can detect is 138 copies/mL. A negative result does not preclude SARS-Cov-2 infection and should not be used as the sole basis for treatment or other patient management decisions. A negative result may occur with  improper specimen collection/handling, submission of specimen other than nasopharyngeal swab, presence of viral mutation(s) within the areas targeted by this assay, and inadequate number of viral copies(<138 copies/mL). A negative result must be combined with clinical observations, patient history, and epidemiological information. The expected result is Negative.  Fact Sheet for Patients:  EntrepreneurPulse.com.au  Fact Sheet for Healthcare Providers:  IncredibleEmployment.be  This test is no t yet approved or cleared by the [place] FDA and  has been authorized for detection and/or diagnosis of SARS-CoV-2 by FDA under an Emergency Use Authorization (EUA). This EUA will remain  in effect (meaning this test can be used) for the duration of the COVID-19 declaration under Section 564(b)(1) of the Act, 21 U.S.C.section 360bbb-3(b)(1), unless the authorization is terminated  or revoked sooner.       Influenza A by PCR NEGATIVE NEGATIVE Final   Influenza B by PCR NEGATIVE NEGATIVE Final    Comment: (NOTE) The Xpert Xpress SARS-CoV-2/FLU/RSV plus assay is intended as an aid in the diagnosis of influenza from Nasopharyngeal swab specimens and should not be used as a sole basis for treatment.  Nasal washings and aspirates are unacceptable for Xpert Xpress SARS-CoV-2/FLU/RSV testing.  Fact Sheet for Patients: EntrepreneurPulse.com.au  Fact Sheet for Healthcare Providers: IncredibleEmployment.be  This test is not yet approved or cleared by the [place] FDA and has been authorized for detection and/or diagnosis of SARS-CoV-2 by FDA under an Emergency Use Authorization (EUA). This EUA will remain in effect (meaning this test can be used) for the duration of the COVID-19 declaration under Section 564(b)(1) of the Act, 21 U.S.C. section 360bbb-3(b)(1), unless the authorization is terminated or revoked.  Performed at [facility], [address], [place], [place] [address]      No results found.   Family Communication: None at bedside  Disposition: Status is: Inpatient Remains inpatient appropriate because: persistent and worsening hyponatremia Planned Discharge Destination: Home once Na improving.  Patrecia Pour, MD 07/02/2021 11:20 AM Page by [email or web address]

## 2021-07-03 DIAGNOSIS — E119 Type 2 diabetes mellitus without complications: Secondary | ICD-10-CM

## 2021-07-03 DIAGNOSIS — I2510 Atherosclerotic heart disease of native coronary artery without angina pectoris: Secondary | ICD-10-CM

## 2021-07-03 LAB — BASIC METABOLIC PANEL
Anion gap: 9 (ref 5–15)
BUN: 19 mg/dL (ref 8–23)
CO2: 28 mmol/L (ref 22–32)
Calcium: 8.6 mg/dL — ABNORMAL LOW (ref 8.9–10.3)
Chloride: 90 mmol/L — ABNORMAL LOW (ref 98–111)
Creatinine, Ser: 0.89 mg/dL (ref 0.61–1.24)
GFR, Estimated: >60 mL/min (ref >60)
Glucose, Bld: 225 mg/dL — ABNORMAL HIGH (ref 70–99)
Potassium: 3.4 mmol/L — ABNORMAL LOW (ref 3.5–5.1)
Sodium: 127 mmol/L — ABNORMAL LOW (ref 135–145)

## 2021-07-03 LAB — GLUCOSE, CAPILLARY
Glucose-Capillary: 176 mg/dL — ABNORMAL HIGH (ref 70–99)
Glucose-Capillary: 210 mg/dL — ABNORMAL HIGH (ref 70–99)

## 2021-07-03 LAB — SURGICAL PATHOLOGY

## 2021-07-03 MED ORDER — SODIUM CHLORIDE 1 G PO TABS: 1.0000 g | ORAL_TABLET | Freq: Three times a day (TID) | ORAL | 0 refills | Status: DC

## 2021-07-03 NOTE — Progress Notes (Signed)
Occupational Therapy Treatment Patient Details Name: Wyatt [name] MRN: [id number] DOB: 1934-03-07 Today's Date: 07/03/2021   History of present illness Patient is a 88 y/o male who presents on 06/27/21 with syncope, dizziness, + LOC and abdominal pain. Found to have pancreatic adenocarcinoma which is new finding, thrombocytopenia and bilateral acute SDH. PMH includes HTN, DM, CAD, thrombocytopenia.   OT comments  Pt assisted with all aspects of dressing and toileting this session. Overall pt at a min guard level for safety as he appears mildly unsteady at times in standing. Pt requiring no physical assist throughout and maintained good safety awareness. OT will continue to follow acutely.    Recommendations for follow up therapy are one component of a multi-disciplinary discharge planning process, led by the attending physician.  Recommendations may be updated based on patient status, additional functional criteria and insurance authorization.    Follow Up Recommendations  No OT follow up    Assistance Recommended at Discharge Intermittent Supervision/Assistance  Patient can return home with the following  Assistance with cooking/housework;Assist for transportation;Help with stairs or ramp for entrance   Equipment Recommendations  None recommended by OT    Recommendations for Other Services      Precautions / Restrictions Precautions Precautions: Fall Restrictions Weight Bearing Restrictions: No       Mobility Bed Mobility Overal bed mobility: Modified Independent             General bed mobility comments: Slow to sit up EOB    Transfers Overall transfer level: Needs assistance Equipment used: None Transfers: Sit to/from Stand Sit to Stand: Min guard           General transfer comment: Performed multiple times during session, no physical assist only min guard for safety     Balance Overall balance assessment: Needs assistance Sitting-balance support: Feet  supported, No upper extremity supported Sitting balance-Leahy Scale: Good     Standing balance support: No upper extremity supported Standing balance-Leahy Scale: Fair Standing balance comment: A little unsteady, however no apparent LoB                           ADL either performed or assessed with clinical judgement   ADL Overall ADL's : Needs assistance/impaired     Grooming: Wash/dry face;Wash/dry hands;Supervision/safety;Standing           Upper Body Dressing : Independent;Sitting   Lower Body Dressing: Min guard;Sitting/lateral leans;Sit to/from stand   Toilet Transfer: Min guard;Ambulation   Toileting- Clothing Manipulation and Hygiene: Supervision/safety;Sitting/lateral lean       Functional mobility during ADLs: Min guard General ADL Comments: mildly unsteady on his feet, generalized weakness, poor activity tolerance. ASsisted pt with getting dressed and toileting, overall he needed no physical assist, only min gaurd for safety    Extremity/Trunk Assessment              Vision       Perception     Praxis      Cognition Arousal/Alertness: Awake/alert Behavior During Therapy: WFL for tasks assessed/performed Overall Cognitive Status: No family/caregiver present to determine baseline cognitive functioning                                 General Comments: HOH, able to process multi-step commands and follow all directions        Exercises      Shoulder Instructions  General Comments VSS on RA    Pertinent Vitals/ Pain       Pain Assessment Pain Assessment: No/denies pain  Home Living                                          Prior Functioning/Environment              Frequency  Min 2X/week        Progress Toward Goals  OT Goals(current goals can now be found in the care plan section)  Progress towards OT goals: Progressing toward goals  Acute Rehab OT Goals Patient Stated  Goal: To go home OT Goal Formulation: With patient Time For Goal Achievement: 07/14/21 Potential to Achieve Goals: Good ADL Goals Pt Will Transfer to Toilet: Independently;ambulating Pt/caregiver will Perform Home Exercise Program: Increased strength;Both right and left upper extremity;With written HEP provided Additional ADL Goal #1: Pt will indep verbalize at least 3 fall prevention strategies to apply to the home setting  Plan Discharge plan remains appropriate;Frequency remains appropriate    Co-evaluation                 AM-PAC OT "6 Clicks" Daily Activity     Outcome Measure   Help from another person eating meals?: None Help from another person taking care of personal grooming?: A Little Help from another person toileting, which includes using toliet, bedpan, or urinal?: A Little Help from another person bathing (including washing, rinsing, drying)?: A Little Help from another person to put on and taking off regular upper body clothing?: None Help from another person to put on and taking off regular lower body clothing?: A Little 6 Click Score: 20    End of Session Equipment Utilized During Treatment: Gait belt  OT Visit Diagnosis: Unsteadiness on feet (R26.81);Other abnormalities of gait and mobility (R26.89);Muscle weakness (generalized) (M62.81);History of falling (Z91.81)   Activity Tolerance Patient tolerated treatment well   Patient Left in bed;with call bell/phone within reach   Nurse Communication Mobility status        Time: 1326-1340 OT Time Calculation (min): 14 min  Charges: OT General Charges $OT Visit: 1 Visit OT Treatments $Self Care/Home Management : 8-22 mins  Jermya Dowding H., OTR/L Acute Rehabilitation  Tajana Crotteau Elane Bing Plume 07/03/2021, 5:36 PM

## 2021-07-03 NOTE — TOC Transition Note (Signed)
Transition of Care [facility]) - CM/SW Discharge Note   Patient Details  Name: Wyatt [name] MRN: [id number] Date of Birth: 1934/05/12  Transition of Care [facility]) CM/SW Contact:  Pollie Friar, RN Phone Number: 07/03/2021, 12:37 PM   Clinical Narrative:    Patient discharging home with home health services through [place]. Information on the AVS. Pt has intermittent supervision at home and transportation to home.    Final next level of care: Home w Home Health Services Barriers to Discharge: No Barriers Identified   Patient Goals and CMS Choice   CMS Medicare.gov Compare Post Acute Care list provided to:: Patient Represenative (must comment) Choice offered to / list presented to :  Wyatt [name] daughter)  Discharge Placement                       Discharge Plan and Services   Discharge Planning Services: CM Consult Post Acute Care Choice: Home Health                    HH Arranged: PT, RN [facility] Agency: [place] Date [place]: 07/02/21   Representative spoke with at [place]: [place] ([place]) Interventions     Readmission Risk Interventions No flowsheet data found.

## 2021-07-03 NOTE — Discharge Summary (Signed)
Physician Discharge Summary   Patient: Wyatt [name] MRN: [id number] DOB: 03-19-1934  Admit date:     06/27/2021  Discharge date: 07/03/21  Discharge Physician: Patrecia Pour   PCP: Asencion Noble, MD   Recommendations at discharge:  Follow up with oncology/hematology, Dr. Delton Coombes. Pt will need omental biopsy to evaluate for suspected metastatic pancreatic cancer, which has been scheduled for the week following discharge. Discussions of goals of care to be continued following this information.  Avoid antiplatelet and anticoagulant agents due to SDH. Monitor BMP and CBC with attention to sodium level (discharged on fluid restriction and salt tabs for SIADH) and platelet count (s/p decadron with improvement in platelet count).  Discharge Diagnoses: Principal Problem:   Subdural hematoma Active Problems:   DM (diabetes mellitus) (HCC)   HLD (hyperlipidemia)   Essential hypertension   Coronary atherosclerosis   Thrombocytopenia (HCC)   Syncope, vasovagal   Pancreatic adenocarcinoma [facility])   Hyponatremia  Hospital Course: Wyatt [name] is a 88 y.o. male with medical history significant of DM, HTN, HLD, CAD, thrombocytopenia who presents after a syncopal episode at home.  Patient states that he has had right upper quadrant abdominal pain for about 3 months.  on the day of admission. pt  got out of bed and was walking to the restroom when he passed out.  He states that he was out for minutes.  He denies any recent illnesses, no chest pain, shortness of breath, nausea or vomiting.  Denies any gross blood loss from his thrombocytopenia.  He had recently undergone bone marrow biopsy at the end of January for thrombocytopenia work-up. He was found to have sub dural hematoma. Neuro surgery consulted and recommended no surgical intervention.  Hospital course complicated by hyponatremia, which has worsened. Pt was found to have adeno ca on the CT abd and pelvis.IR consulted for omental biopsy  scheduled as outpatient.  GI referral was also made to see if he needs EUS, in case the omental biopsy does not yield the diagnosis.    Hyponatremia worsened with isotonic saline and labs consistent with SIADH prompted treatment with fluid restriction, low dose lasix, and addition of salt tabs. Sodium level has improved and the patient remains asymptomatic, cleared for discharge on 2/17.  Assessment and Plan: * Subdural hematoma- (present on admission) S/p syncope at home. CT head: Acute right parietal subdural hematoma with maximum thickness of 6 mm. Mild mass effect upon the underlying cerebral hemisphere with approximately 2 mm of midline shift. Tiny left subdural hematoma overlying the inferior left parietal lobe also noted. Repeat CT demonstrated stable size and appearance of subdural hematomas bilaterally. No new acute findings are noted. - Neurosurgery, Dr. Arnoldo Morale, evaluated the patient, recommends against any neurosurgical intervention, plans to follow up as outpatient.  - Avoid antiplatelet/anticoagulation.  Hyponatremia With inappropriately elevated urine sodium and osmolality. Worsened with isotonic IVF. 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 TSH wnl.  - Sodium improved with lasix and salt tabs with fluid restriction, up to 127 (corrects to 129) on day of discharge. Remains asymptomatic. D/w nephrology, Dr. Carolin Sicks who assisted with management. Continue checking BMP at follow up, ok with discharge. - Adrenal insufficiency also on the differential as patient was given high dose decadron, though no other symptoms/signs at this time. Will defer empiric hydrocortisone, ACTH stim test unlikely to be accurate.  - Pt was given DDAVP due to thrombocytopenia and SDH on admission which may be contributing as well.   Pancreatic adenocarcinoma (  [place])- (present on admission) New finding on CT A/P: Ill-defined low-density mass at the body with upstream pancreatic  atrophy, mass roughly 3.1 cm in length. The mass infiltrates the peripancreatic fat, especially inferiorly. Associated splenic vein occlusion with collaterals. - Not biopsy-proven yet, though tumor marker severely elevated. IR was consulted and will perform omental biopsy as an outpatient. GI evaluation also requested afterward in the event that biopsy is nondiagnostic.  - Dr. Delton Coombes aware of admission and has arranged follow up on 2/28.  Syncope, vasovagal- (present on admission) Fall precautions. Therapy has recommended home health PT and OT which have been ordered.  Thrombocytopenia ([place])- (present on admission) Followed by Dr. Delton Coombes s/p bone marrow biopsy Jan 2023.  - Completed 4 days of decadron $RemoveBef'40mg'FnOQGetTnb$  daily with PPI for GI ppx. Platelet count has subsequently improved.  - Continue outpatient follow up with Dr. Delton Coombes who can also assist with suspected cancer.  Coronary atherosclerosis- (present on admission) Hold aspirin in setting of SDH and thrombocytopenia. Pt denies any chest pain or sob. Continue statin, beta blocker, prn NTG.  Essential hypertension- (present on admission) Will continue lisinopril and lopressor at discharge.  HLD (hyperlipidemia)- (present on admission) Continue simvastatin. LFTs wnl.  DM (diabetes mellitus) ([place]) HbA1c 7.5%. Continue home Tx and f/u with PCP.  Consultants: Oncology, IR, GI, nephrology Procedures performed: None  Disposition: Home Diet recommendation:  Discharge Diet Orders (From admission, onward)     Start     Ordered   07/03/21 0000  Diet Carb Modified       Comments: fluid restriction of 1,539ml per day   07/03/21 1158           DISCHARGE MEDICATION: Allergies as of 07/03/2021   No Known Allergies      Medication List     STOP taking these medications    traMADol 50 MG tablet Commonly known as: ULTRAM       TAKE these medications    famotidine 20 MG tablet Commonly known as: Pepcid Take 1 tablet  (20 mg total) by mouth at bedtime.   ibuprofen 200 MG tablet Commonly known as: ADVIL Take 200 mg by mouth every 6 (six) hours as needed.   lisinopril 40 MG tablet Commonly known as: ZESTRIL Take 40 mg by mouth daily.   metFORMIN 1000 MG tablet Commonly known as: GLUCOPHAGE Take 500 mg by mouth 2 (two) times daily.   metoprolol tartrate 25 MG tablet Commonly known as: LOPRESSOR Take 12.5 mg by mouth 2 (two) times daily.   nitroGLYCERIN 0.4 MG SL tablet Commonly known as: NITROSTAT Place 0.4 mg under the tongue every 5 (five) minutes as needed for chest pain.   pantoprazole 40 MG tablet Commonly known as: PROTONIX Take 1 tablet (40 mg total) by mouth daily before breakfast.   simvastatin 20 MG tablet Commonly known as: ZOCOR Take 20 mg by mouth daily.   sodium chloride 1 g tablet Take 1 tablet (1 g total) by mouth 3 (three) times daily with meals.        Follow-up Information     [place] Follow up.   Why: IR schedulers will call you to set up omental biopsy - please call with any questions or concerns prior to your appointment. Contact information: [place] [address] [phone]         [facility], [facility] Follow up.   Specialty: Home Health Services Why: The home health agency will contact you for the  first home visit. Contact information: [place] [address] [phone]         Asencion Noble, MD Follow up.   Specialty: Internal Medicine Contact information: [address] [place] [address] [phone]         Derek Jack, MD. Daphane Shepherd on 07/14/2021.   Specialty: Hematology Why: 8:15am Contact information: [place] [address] [phone]         Mansouraty, Telford Nab., MD. Go on 07/24/2021.   Specialties: Gastroenterology, Internal Medicine Why: 3:10pm Contact information: [place]  [address] [phone]         Kyung Rudd, MD. Go on 07/08/2021.   Specialty: Radiation Oncology Why: 12:30pm Contact information: [address] [place] [address] [phone]                Subjective: Feels well, walking around much more over past 24 hours. No dizziness, headache, vision changes, nausea, vomiting, diarrhea, or pain anywhere. Wants to go home.  Discharge Exam: Filed Weights   06/27/21 0943  Weight: 64.9 kg  BP 116/74 (BP Location: Right Arm)    Pulse 88    Temp 98 F (36.7 C) (Oral)    Resp 18    Ht 6' (1.829 m)    Wt 64.9 kg    SpO2 98%    BMI 19.39 kg/m   No distress, sitting comfortably bedside chair MMM Clear, nonlabored RRR no edema or JVD No focal deficits  Condition at discharge: stable  The results of significant diagnostics from this hospitalization (including imaging, microbiology, ancillary and laboratory) are listed below for reference.   Imaging Studies: DG Chest 1 View  Result Date: 06/27/2021 CLINICAL DATA:  Status post fall. EXAM: CHEST  1 VIEW COMPARISON:  06/23/2012 FINDINGS: The heart size and mediastinal contours are within normal limits. Aortic atherosclerosis. Both lungs are clear. The visualized skeletal structures are unremarkable. IMPRESSION: No active disease. Electronically Signed   By: Kerby Moors M.D.   On: 06/27/2021 11:41   CT HEAD WO CONTRAST  Result Date: 06/28/2021 CLINICAL DATA:  88 year old male with history of bilateral subdural hematomas. Follow-up study. EXAM: CT HEAD WITHOUT CONTRAST TECHNIQUE: Contiguous axial images were obtained from the base of the skull through the vertex without intravenous contrast. RADIATION DOSE REDUCTION: This exam was performed according to the departmental dose-optimization program which includes automated exposure control, adjustment of the mA and/or kV according to patient size and/or use of iterative reconstruction technique. COMPARISON:  Head CT 06/27/2021. FINDINGS: Brain:  There is again a trace left subdural high attenuation fluid collection adjacent to the sylvian fissure (coronal image 38 of series 5) measuring 2 mm in thickness, stable. Right-sided subdural high attenuation fluid collection adjacent to the right posterior frontal and parietal region (coronal image 29 of series 5), also similar to the prior study measuring up to 7 mm in thickness on today's examination. No new acute intracranial hemorrhage otherwise noted. No intraventricular blood. Moderate cerebral atrophy with ex vacuo dilatation of the ventricular system. Patchy and confluent areas of decreased attenuation are noted throughout the deep and periventricular white matter of the cerebral hemispheres bilaterally, compatible with chronic microvascular ischemic disease. Vascular: Extensive atherosclerosis in the cerebral vasculature. Skull: Normal. Negative for fracture or focal lesion. Sinuses/Orbits: No acute finding. Other: None. IMPRESSION: 1. Stable size and appearance of subdural hematomas bilaterally. No new acute findings are noted. 2. Moderate cerebral atrophy with ex vacuo dilatation of the  ventricular system and extensive chronic microvascular ischemic changes in the cerebral white matter. Electronically Signed   By: Vinnie Langton M.D.   On: 06/28/2021 08:00   CT Head Wo Contrast  Result Date: 06/27/2021 CLINICAL DATA:  Status post fall.  Hit head on bathroom floor. EXAM: CT HEAD WITHOUT CONTRAST CT CERVICAL SPINE WITHOUT CONTRAST TECHNIQUE: Multidetector CT imaging of the head and cervical spine was performed following the standard protocol without intravenous contrast. Multiplanar CT image reconstructions of the cervical spine were also generated. RADIATION DOSE REDUCTION: This exam was performed according to the departmental dose-optimization program which includes automated exposure control, adjustment of the mA and/or kV according to patient size and/or use of iterative reconstruction technique.  COMPARISON:  07/14/2020 FINDINGS: CT HEAD FINDINGS Brain: Acute right parietal subdural hematoma is identified, image 33/4. This has a maximum thickness of 6 mm, image 33/4. Mild mass effect upon the underlying cerebral hemisphere with approximately 2 mm of midline shift. Tiny left subdural hematoma is also noted overlying the inferior left parietal lobe measuring 3 mm, image 19/2. No signs of intraventricular hemorrhage. Prominence of the sulci and ventricles compatible with atrophy. There is mild diffuse low-attenuation within the subcortical and periventricular white matter compatible with chronic microvascular disease. Vascular: No hyperdense vessel or unexpected calcification. Skull: Normal. Negative for fracture or focal lesion. Sinuses/Orbits: The paranasal sinuses and mastoid air cells are clear. Other: None. CT CERVICAL SPINE FINDINGS Alignment: Normal. Skull base and vertebrae: No acute fracture. No primary bone lesion or focal pathologic process. Soft tissues and spinal canal: No prevertebral fluid or swelling. No visible canal hematoma. Disc levels: Multilevel disc space narrowing and endplate spurring noted at C4-5 through C6-7. Upper chest: Negative. Other: None IMPRESSION: 1. Acute right parietal subdural hematoma with maximum thickness of 6 mm. Mild mass effect upon the underlying cerebral hemisphere with approximately 2 mm of midline shift. 2. Tiny left subdural hematoma overlying the inferior left parietal lobe also noted. 3. Chronic small vessel ischemic disease and atrophy. 4. No evidence for cervical spine fracture or subluxation. 5. Cervical degenerative disc disease. Critical Value/emergent results were called by telephone at the time of interpretation on 06/27/2021 at 11:30 am to provider [facility] , who verbally acknowledged these results. Electronically Signed   By: Kerby Moors M.D.   On: 06/27/2021 11:30   CT Cervical Spine Wo Contrast  Result Date: 06/27/2021 CLINICAL DATA:   Status post fall.  Hit head on bathroom floor. EXAM: CT HEAD WITHOUT CONTRAST CT CERVICAL SPINE WITHOUT CONTRAST TECHNIQUE: Multidetector CT imaging of the head and cervical spine was performed following the standard protocol without intravenous contrast. Multiplanar CT image reconstructions of the cervical spine were also generated. RADIATION DOSE REDUCTION: This exam was performed according to the departmental dose-optimization program which includes automated exposure control, adjustment of the mA and/or kV according to patient size and/or use of iterative reconstruction technique. COMPARISON:  07/14/2020 FINDINGS: CT HEAD FINDINGS Brain: Acute right parietal subdural hematoma is identified, image 33/4. This has a maximum thickness of 6 mm, image 33/4. Mild mass effect upon the underlying cerebral hemisphere with approximately 2 mm of midline shift. Tiny left subdural hematoma is also noted overlying the inferior left parietal lobe measuring 3 mm, image 19/2. No signs of intraventricular hemorrhage. Prominence of the sulci and ventricles compatible with atrophy. There is mild diffuse low-attenuation within the subcortical and periventricular white matter compatible with chronic microvascular disease. Vascular: No hyperdense vessel or unexpected calcification. Skull: Normal. Negative for  fracture or focal lesion. Sinuses/Orbits: The paranasal sinuses and mastoid air cells are clear. Other: None. CT CERVICAL SPINE FINDINGS Alignment: Normal. Skull base and vertebrae: No acute fracture. No primary bone lesion or focal pathologic process. Soft tissues and spinal canal: No prevertebral fluid or swelling. No visible canal hematoma. Disc levels: Multilevel disc space narrowing and endplate spurring noted at C4-5 through C6-7. Upper chest: Negative. Other: None IMPRESSION: 1. Acute right parietal subdural hematoma with maximum thickness of 6 mm. Mild mass effect upon the underlying cerebral hemisphere with approximately  2 mm of midline shift. 2. Tiny left subdural hematoma overlying the inferior left parietal lobe also noted. 3. Chronic small vessel ischemic disease and atrophy. 4. No evidence for cervical spine fracture or subluxation. 5. Cervical degenerative disc disease. Critical Value/emergent results were called by telephone at the time of interpretation on 06/27/2021 at 11:30 am to provider [facility] , who verbally acknowledged these results. Electronically Signed   By: Kerby Moors M.D.   On: 06/27/2021 11:30   CT Abdomen Pelvis W Contrast  Result Date: 06/27/2021 CLINICAL DATA:  Right lower quadrant pain for 5 months EXAM: CT ABDOMEN AND PELVIS WITH CONTRAST TECHNIQUE: Multidetector CT imaging of the abdomen and pelvis was performed using the standard protocol following bolus administration of intravenous contrast. RADIATION DOSE REDUCTION: This exam was performed according to the departmental dose-optimization program which includes automated exposure control, adjustment of the mA and/or kV according to patient size and/or use of iterative reconstruction technique. CONTRAST:  141mL OMNIPAQUE IOHEXOL 300 MG/ML  SOLN COMPARISON:  06/29/2012 06/29/2012 FINDINGS: Lower chest: No acute finding. Mild subpleural reticulation at the bases. Coronary atherosclerosis. Hepatobiliary: No focal liver abnormality.Cholecystectomy. No bile duct dilatation. Pancreas: Ill-defined low-density mass at the body with upstream pancreatic atrophy, mass roughly 3.1 cm in length. The mass infiltrates the peripancreatic fat, especially inferiorly. Associated splenic vein occlusion with collaterals. Spleen: Unremarkable. Adrenals/Urinary Tract: Negative adrenals. No hydronephrosis or stone. Left renal cystic density. Unremarkable bladder. Stomach/Bowel: Negative for bowel obstruction or visible inflammation. Extensive sigmoid diverticulosis. Reticulation fat around the rectal and sigmoid segments is primarily from peritoneal disease. No  suspected colitis. No appendicitis. Vascular/Lymphatic: Extensive atheromatous calcification of the aorta and branch vessels. Chronic splenic vein occlusion with collaterals as noted above. No noted adenopathy Reproductive:Unremarkable for age Other: Reticulation of the omentum diffusely with small volume peritoneal fluid in the pelvis where there is peritoneal thickening and enhancement. No measurable nodule. Musculoskeletal: No acute abnormalities. IMPRESSION: 1. Constellation of findings suggest pancreas adenocarcinoma with peritoneal spread. 2. 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 Electronically Signed   By: Jorje Guild M.D.   On: 06/27/2021 12:44   CT Biopsy  Result Date: 06/16/2021 INDICATION: 88 year old with thrombocytopenia.  Request for bone marrow biopsy. EXAM: CT GUIDED BONE MARROW ASPIRATES AND BIOPSY Physician: Stephan Minister. Anselm Pancoast, MD MEDICATIONS: None. ANESTHESIA/SEDATION: Moderate (conscious) sedation was employed during this procedure. A total of Versed 1.$RemoveBef'0mg'LvvAPiqqaG$  and fentanyl 50 mcg was administered intravenously at the order of the provider performing the procedure. Total intra-service moderate sedation time: 10 minutes. Patient's level of consciousness and vital signs were monitored continuously by radiology nurse throughout the procedure under the supervision of the provider performing the procedure. COMPLICATIONS: None immediate. PROCEDURE: The procedure was explained to the patient. The risks and benefits of the procedure were discussed and the patient's questions were addressed. Informed consent was obtained from the patient. The patient was placed prone on CT table. Images of  the pelvis were obtained. The left side of back was prepped and draped in sterile fashion. The skin and left posterior ilium were anesthetized with 1% lidocaine. 11 gauge bone needle was directed into the left ilium with CT guidance. Two aspirates and one core biopsy were  obtained. Bandage placed over the puncture site. FINDINGS: Images of the pelvis demonstrate extensive diverticulosis in the sigmoid colon. Trace fluid in the pelvis. Pericolonic densities around the sigmoid colon are suggestive for age-indeterminate inflammatory changes. There is probably a right inguinal hernia but this area is incompletely imaged. Two foci of gas associated with the right SI joint. Biopsy needle was directed to the posterior left ilium. IMPRESSION: 1. CT-guided bone marrow aspiration and biopsy. 2. Diverticulosis with trace fluid in the pelvis. Pericolonic densities could be related to inflammation of unknown age. Difficult to exclude occult diverticulitis. If there is concern for acute diverticulitis, recommend repeat CT of the abdomen and pelvis with intravascular contrast. These results will be called to the ordering clinician or representative by the Radiologist Assistant, and communication documented in the PACS or [employer]. Electronically Signed   By: Markus Daft M.D.   On: 06/16/2021 10:56   CT BONE MARROW BIOPSY & ASPIRATION  Result Date: 06/16/2021 INDICATION: 88 year old with thrombocytopenia.  Request for bone marrow biopsy. EXAM: CT GUIDED BONE MARROW ASPIRATES AND BIOPSY Physician: Stephan Minister. Anselm Pancoast, MD MEDICATIONS: None. ANESTHESIA/SEDATION: Moderate (conscious) sedation was employed during this procedure. A total of Versed 1.$RemoveBef'0mg'AuDrlkjbwN$  and fentanyl 50 mcg was administered intravenously at the order of the provider performing the procedure. Total intra-service moderate sedation time: 10 minutes. Patient's level of consciousness and vital signs were monitored continuously by radiology nurse throughout the procedure under the supervision of the provider performing the procedure. COMPLICATIONS: None immediate. PROCEDURE: The procedure was explained to the patient. The risks and benefits of the procedure were discussed and the patient's questions were addressed. Informed consent was  obtained from the patient. The patient was placed prone on CT table. Images of the pelvis were obtained. The left side of back was prepped and draped in sterile fashion. The skin and left posterior ilium were anesthetized with 1% lidocaine. 11 gauge bone needle was directed into the left ilium with CT guidance. Two aspirates and one core biopsy were obtained. Bandage placed over the puncture site. FINDINGS: Images of the pelvis demonstrate extensive diverticulosis in the sigmoid colon. Trace fluid in the pelvis. Pericolonic densities around the sigmoid colon are suggestive for age-indeterminate inflammatory changes. There is probably a right inguinal hernia but this area is incompletely imaged. Two foci of gas associated with the right SI joint. Biopsy needle was directed to the posterior left ilium. IMPRESSION: 1. CT-guided bone marrow aspiration and biopsy. 2. Diverticulosis with trace fluid in the pelvis. Pericolonic densities could be related to inflammation of unknown age. Difficult to exclude occult diverticulitis. If there is concern for acute diverticulitis, recommend repeat CT of the abdomen and pelvis with intravascular contrast. These results will be called to the ordering clinician or representative by the Radiologist Assistant, and communication documented in the PACS or [employer]. Electronically Signed   By: Markus Daft M.D.   On: 06/16/2021 10:56   DG HIP UNILAT WITH PELVIS 2-3 VIEWS RIGHT  Result Date: 06/27/2021 CLINICAL DATA:  Fall.  Right-sided abdominal pain. EXAM: DG HIP (WITH OR WITHOUT PELVIS) 2-3V RIGHT COMPARISON:  None FINDINGS: There is no evidence of hip fracture or dislocation. Mild right hip osteoarthritis. Soft tissues are unremarkable.  IMPRESSION: 1. No acute findings. 2. Mild right hip osteoarthritis. Electronically Signed   By: Kerby Moors M.D.   On: 06/27/2021 11:40    Microbiology: Results for orders placed or performed during the hospital encounter of 06/27/21   Resp Panel by RT-PCR (Flu A&B, Covid) Nasopharyngeal Swab     Status: None   Collection Time: 06/27/21  1:14 PM   Specimen: Nasopharyngeal Swab; Nasopharyngeal(NP) swabs in vial transport medium  Result Value Ref Range Status   SARS Coronavirus 2 by RT PCR NEGATIVE NEGATIVE Final    Comment: (NOTE) SARS-CoV-2 target nucleic acids are NOT DETECTED.  The SARS-CoV-2 RNA is generally detectable in upper respiratory specimens during the acute phase of infection. The lowest concentration of SARS-CoV-2 viral copies this assay can detect is 138 copies/mL. A negative result does not preclude SARS-Cov-2 infection and should not be used as the sole basis for treatment or other patient management decisions. A negative result may occur with  improper specimen collection/handling, submission of specimen other than nasopharyngeal swab, presence of viral mutation(s) within the areas targeted by this assay, and inadequate number of viral copies(<138 copies/mL). A negative result must be combined with clinical observations, patient history, and epidemiological information. The expected result is Negative.  Fact Sheet for Patients:  EntrepreneurPulse.com.au  Fact Sheet for Healthcare Providers:  IncredibleEmployment.be  This test is no t yet approved or cleared by the [place] FDA and  has been authorized for detection and/or diagnosis of SARS-CoV-2 by FDA under an Emergency Use Authorization (EUA). This EUA will remain  in effect (meaning this test can be used) for the duration of the COVID-19 declaration under Section 564(b)(1) of the Act, 21 U.S.C.section 360bbb-3(b)(1), unless the authorization is terminated  or revoked sooner.       Influenza A by PCR NEGATIVE NEGATIVE Final   Influenza B by PCR NEGATIVE NEGATIVE Final    Comment: (NOTE) The Xpert Xpress SARS-CoV-2/FLU/RSV plus assay is intended as an aid in the diagnosis of influenza from  Nasopharyngeal swab specimens and should not be used as a sole basis for treatment. Nasal washings and aspirates are unacceptable for Xpert Xpress SARS-CoV-2/FLU/RSV testing.  Fact Sheet for Patients: EntrepreneurPulse.com.au  Fact Sheet for Healthcare Providers: IncredibleEmployment.be  This test is not yet approved or cleared by the [place] FDA and has been authorized for detection and/or diagnosis of SARS-CoV-2 by FDA under an Emergency Use Authorization (EUA). This EUA will remain in effect (meaning this test can be used) for the duration of the COVID-19 declaration under Section 564(b)(1) of the Act, 21 U.S.C. section 360bbb-3(b)(1), unless the authorization is terminated or revoked.  Performed at [facility], [address] [place], [place] [address]     Labs: CBC: Recent Labs  Lab 06/27/21 1021 06/28/21 0228 06/28/21 1020 06/30/21 0355  WBC 7.5 4.8 8.6 8.8  NEUTROABS 6.1  --  7.5  --   HGB 12.7* 12.2* 12.4* 11.0*  HCT 38.0* 36.1* 35.9* 32.0*  MCV 85.6 83.6 82.7 82.7  PLT 50* 53* 69* 427*   Basic Metabolic Panel: Recent Labs  Lab 06/30/21 0355 06/30/21 1811 07/01/21 1522 07/02/21 0310 07/03/21 1039  NA 127*   126* 123* 128* 125* 127*  K 4.2 4.0 3.5 3.4* 3.4*  CL 92* 91* 93* 93* 90*  CO2 $Re'23 23 26 24 28  'Xqq$ GLUCOSE 201* 284* 187* 158* 225*  BUN $Re'21 18 19 21 19  'pug$ CREATININE 0.69 0.85 0.90 0.78 0.89  CALCIUM 8.6* 8.4* 8.5* 7.9* 8.6*  MG 1.5*  --   --   --   --    Liver Function Tests: Recent Labs  Lab 06/27/21 1021  AST 19  ALT 20  ALKPHOS 62  BILITOT 1.1  PROT 6.3*  ALBUMIN 3.6   CBG: Recent Labs  Lab 07/02/21 0618 07/02/21 1312 07/02/21 1724 07/02/21 2142 07/03/21 0606  GLUCAP 155* 210* 156* 148* 176*    Discharge time spent: greater than 30 minutes.  Signed: Patrecia Pour, MD [facility] 07/03/2021

## 2021-07-03 NOTE — Progress Notes (Signed)
Discharge instructions given. Patient and family member verbalized answered. All questions were answered.

## 2021-07-06 ENCOUNTER — Other Ambulatory Visit (HOSPITAL_COMMUNITY): Payer: Self-pay | Admitting: *Deleted

## 2021-07-06 ENCOUNTER — Encounter (HOSPITAL_COMMUNITY): Payer: Self-pay

## 2021-07-06 DIAGNOSIS — E871 Hypo-osmolality and hyponatremia: Secondary | ICD-10-CM

## 2021-07-06 DIAGNOSIS — C259 Malignant neoplasm of pancreas, unspecified: Secondary | ICD-10-CM

## 2021-07-06 MED ORDER — HYDROCODONE-ACETAMINOPHEN 7.5-325 MG PO TABS: 1.0000 | ORAL_TABLET | Freq: Four times a day (QID) | ORAL | 0 refills | Status: DC | PRN

## 2021-07-06 NOTE — Progress Notes (Signed)
Radiation Oncology         [phone] ________________________________  Initial Outpatient Consultation - Conducted via telephone due to current COVID-19 concerns for limiting patient exposure  I spoke with the patient to conduct this consult visit via telephone to spare the patient unnecessary potential exposure in the healthcare setting during the current COVID-19 pandemic. The patient was notified in advance and was offered a [place] meeting to allow for face to face communication but unfortunately reported that they did not have the appropriate resources/technology to support such a visit and instead preferred to proceed with a telephone consult.    ________________________________  Name: Wyatt [name]        MRN: [id number]  Date of Service: 07/08/2021 DOB: 1933-10-12  FA:[id number], Carloyn Manner, MD  Hosie Poisson, MD     REFERRING PHYSICIAN: Hosie Poisson, MD   DIAGNOSIS: The encounter diagnosis was Pancreatic adenocarcinoma [facility]).   HISTORY OF PRESENT ILLNESS: Wyatt [name] is a 88 y.o. male seen at the request of Dr. Karleen Hampshire from the [facility].  The patient had a recent hospitalization due to syncope and abdominal pain.  He was in the process also being worked up for severe thrombocytopenia in January 2023.  Dr. Delton Coombes has met with the patient and will see him back in a few more days.  During the hospitalization however CT of the abdomen and pelvis showed an ill-defined mass in the body of the pancreas measuring up to 3.1 cm infiltrating the peripancreatic fat and associated splenic vein occlusion with collateral vessels with noted.  There was also concern for peritoneal thickening and enhancement of the omentum.  No measurable nodule was seen specifically. CA 19-9 was elevated at 16,976. He had a CT of the head performed due to a fall with his syncope and an acute right parietal subdural hematoma was noted but neurosurgery recommended no intervention.  A CT biopsy was ordered  in the outpatient setting and is scheduled for 07/21/21.  He is also scheduled to meet with Dr. Rush Landmark next week to consider endoscopic ultrasound and biopsy.  He is contacted by phone today to discuss the possibilities of radiotherapy for his case.    PREVIOUS RADIATION THERAPY: No   PAST MEDICAL HISTORY:  Past Medical History:  Diagnosis Date   Diabetes mellitus    Hypercholesteremia    Hypertension        PAST SURGICAL HISTORY: Past Surgical History:  Procedure Laterality Date   CARDIAC CATHETERIZATION     CHOLECYSTECTOMY N/A 06/26/2012   Procedure: LAPAROSCOPIC CHOLECYSTECTOMY;  Surgeon: Donato Heinz, MD;  Location: [facility];  Service: General;  Laterality: N/A;   COLONOSCOPY  06/10/2011   Procedure: COLONOSCOPY;  Surgeon: Rogene Houston, MD;  Location: [facility];  Service: Endoscopy;  Laterality: N/A;  1030   ESOPHAGEAL DILATION N/A 07/18/2019   Procedure: ESOPHAGEAL DILATION;  Surgeon: Rogene Houston, MD;  Location: [facility];  Service: Endoscopy;  Laterality: N/A;   ESOPHAGOGASTRODUODENOSCOPY N/A 07/18/2019   Procedure: ESOPHAGOGASTRODUODENOSCOPY (EGD);  Surgeon: Rogene Houston, MD;  Location: [facility];  Service: Endoscopy;  Laterality: N/A;  215 - moved to 8:30 per Lelon Frohlich, pt is aware     FAMILY HISTORY:  Family History  Problem Relation Age of Onset   Colon cancer Mother      SOCIAL HISTORY:  reports that he has quit smoking. He has a 7.00 pack-year smoking history. He has quit using smokeless tobacco. He reports that he does not  drink alcohol and does not use drugs. The patient is married and lives in [place].    ALLERGIES: Patient has no known allergies.   MEDICATIONS:  Current Outpatient Medications  Medication Sig Dispense Refill   famotidine (PEPCID) 20 MG tablet Take 1 tablet (20 mg total) by mouth at bedtime.     ibuprofen (ADVIL) 200 MG tablet Take 200 mg by mouth every 6 (six) hours as needed.     lisinopril (PRINIVIL,ZESTRIL) 40 MG  tablet Take 40 mg by mouth daily.      metFORMIN (GLUCOPHAGE) 1000 MG tablet Take 500 mg by mouth 2 (two) times daily.     metoprolol tartrate (LOPRESSOR) 25 MG tablet Take 12.5 mg by mouth 2 (two) times daily.     nitroGLYCERIN (NITROSTAT) 0.4 MG SL tablet Place 0.4 mg under the tongue every 5 (five) minutes as needed for chest pain.     pantoprazole (PROTONIX) 40 MG tablet Take 1 tablet (40 mg total) by mouth daily before breakfast. 30 tablet 5   simvastatin (ZOCOR) 20 MG tablet Take 20 mg by mouth daily.      sodium chloride 1 g tablet Take 1 tablet (1 g total) by mouth 3 (three) times daily with meals. 90 tablet 0   No current facility-administered medications for this visit.     REVIEW OF SYSTEMS: On review of systems, the patient's granddaughter reports that he has had gradual fatigue and weight loss up to 30 pounds, and pain in the upper right abdomen that radiates to the back and is worse at night time and with laying flat. He has been using hydrocodone which has help his symptoms. He is able to complete ADLs on his own but not much else per family. No other complaints are noted.     PHYSICAL EXAM:  Unable to assess due to encounter type.    ECOG = 2  0 - Asymptomatic (Fully active, able to carry on all predisease activities without restriction)  1 - Symptomatic but completely ambulatory (Restricted in physically strenuous activity but ambulatory and able to carry out work of a light or sedentary nature. For example, light housework, office work)  2 - Symptomatic, <50% in bed during the day (Ambulatory and capable of all self care but unable to carry out any work activities. Up and about more than 50% of waking hours)  3 - Symptomatic, >50% in bed, but not bedbound (Capable of only limited self-care, confined to bed or chair 50% or more of waking hours)  4 - Bedbound (Completely disabled. Cannot carry on any self-care. Totally confined to bed or chair)  5 - Death   Eustace Pen MM,  Creech RH, Tormey DC, et al. [phone]). "Toxicity and response criteria of the [facility] Group". [place] Oncol. 5 (6): 649-55    LABORATORY DATA:  Lab Results  Component Value Date   WBC 8.8 06/30/2021   HGB 11.0 (L) 06/30/2021   HCT 32.0 (L) 06/30/2021   MCV 82.7 06/30/2021   PLT 123 (L) 06/30/2021   Lab Results  Component Value Date   NA 127 (L) 07/03/2021   K 3.4 (L) 07/03/2021   CL 90 (L) 07/03/2021   CO2 28 07/03/2021   Lab Results  Component Value Date   ALT 20 06/27/2021   AST 19 06/27/2021   ALKPHOS 62 06/27/2021   BILITOT 1.1 06/27/2021      RADIOGRAPHY: DG Chest 1 View  Result Date: 06/27/2021 CLINICAL DATA:  Status post fall.  EXAM: CHEST  1 VIEW COMPARISON:  06/23/2012 FINDINGS: The heart size and mediastinal contours are within normal limits. Aortic atherosclerosis. Both lungs are clear. The visualized skeletal structures are unremarkable. IMPRESSION: No active disease. Electronically Signed   By: Kerby Moors M.D.   On: 06/27/2021 11:41   CT HEAD WO CONTRAST  Result Date: 06/28/2021 CLINICAL DATA:  88 year old male with history of bilateral subdural hematomas. Follow-up study. EXAM: CT HEAD WITHOUT CONTRAST TECHNIQUE: Contiguous axial images were obtained from the base of the skull through the vertex without intravenous contrast. RADIATION DOSE REDUCTION: This exam was performed according to the departmental dose-optimization program which includes automated exposure control, adjustment of the mA and/or kV according to patient size and/or use of iterative reconstruction technique. COMPARISON:  Head CT 06/27/2021. FINDINGS: Brain: There is again a trace left subdural high attenuation fluid collection adjacent to the sylvian fissure (coronal image 38 of series 5) measuring 2 mm in thickness, stable. Right-sided subdural high attenuation fluid collection adjacent to the right posterior frontal and parietal region (coronal image 29 of series 5), also  similar to the prior study measuring up to 7 mm in thickness on today's examination. No new acute intracranial hemorrhage otherwise noted. No intraventricular blood. Moderate cerebral atrophy with ex vacuo dilatation of the ventricular system. Patchy and confluent areas of decreased attenuation are noted throughout the deep and periventricular white matter of the cerebral hemispheres bilaterally, compatible with chronic microvascular ischemic disease. Vascular: Extensive atherosclerosis in the cerebral vasculature. Skull: Normal. Negative for fracture or focal lesion. Sinuses/Orbits: No acute finding. Other: None. IMPRESSION: 1. Stable size and appearance of subdural hematomas bilaterally. No new acute findings are noted. 2. Moderate cerebral atrophy with ex vacuo dilatation of the ventricular system and extensive chronic microvascular ischemic changes in the cerebral white matter. Electronically Signed   By: Vinnie Langton M.D.   On: 06/28/2021 08:00   CT Head Wo Contrast  Result Date: 06/27/2021 CLINICAL DATA:  Status post fall.  Hit head on bathroom floor. EXAM: CT HEAD WITHOUT CONTRAST CT CERVICAL SPINE WITHOUT CONTRAST TECHNIQUE: Multidetector CT imaging of the head and cervical spine was performed following the standard protocol without intravenous contrast. Multiplanar CT image reconstructions of the cervical spine were also generated. RADIATION DOSE REDUCTION: This exam was performed according to the departmental dose-optimization program which includes automated exposure control, adjustment of the mA and/or kV according to patient size and/or use of iterative reconstruction technique. COMPARISON:  07/14/2020 FINDINGS: CT HEAD FINDINGS Brain: Acute right parietal subdural hematoma is identified, image 33/4. This has a maximum thickness of 6 mm, image 33/4. Mild mass effect upon the underlying cerebral hemisphere with approximately 2 mm of midline shift. Tiny left subdural hematoma is also noted  overlying the inferior left parietal lobe measuring 3 mm, image 19/2. No signs of intraventricular hemorrhage. Prominence of the sulci and ventricles compatible with atrophy. There is mild diffuse low-attenuation within the subcortical and periventricular white matter compatible with chronic microvascular disease. Vascular: No hyperdense vessel or unexpected calcification. Skull: Normal. Negative for fracture or focal lesion. Sinuses/Orbits: The paranasal sinuses and mastoid air cells are clear. Other: None. CT CERVICAL SPINE FINDINGS Alignment: Normal. Skull base and vertebrae: No acute fracture. No primary bone lesion or focal pathologic process. Soft tissues and spinal canal: No prevertebral fluid or swelling. No visible canal hematoma. Disc levels: Multilevel disc space narrowing and endplate spurring noted at C4-5 through C6-7. Upper chest: Negative. Other: None IMPRESSION: 1. Acute right parietal subdural hematoma  with maximum thickness of 6 mm. Mild mass effect upon the underlying cerebral hemisphere with approximately 2 mm of midline shift. 2. Tiny left subdural hematoma overlying the inferior left parietal lobe also noted. 3. Chronic small vessel ischemic disease and atrophy. 4. No evidence for cervical spine fracture or subluxation. 5. Cervical degenerative disc disease. Critical Value/emergent results were called by telephone at the time of interpretation on 06/27/2021 at 11:30 am to provider [facility] , who verbally acknowledged these results. Electronically Signed   By: Kerby Moors M.D.   On: 06/27/2021 11:30   CT Cervical Spine Wo Contrast  Result Date: 06/27/2021 CLINICAL DATA:  Status post fall.  Hit head on bathroom floor. EXAM: CT HEAD WITHOUT CONTRAST CT CERVICAL SPINE WITHOUT CONTRAST TECHNIQUE: Multidetector CT imaging of the head and cervical spine was performed following the standard protocol without intravenous contrast. Multiplanar CT image reconstructions of the cervical  spine were also generated. RADIATION DOSE REDUCTION: This exam was performed according to the departmental dose-optimization program which includes automated exposure control, adjustment of the mA and/or kV according to patient size and/or use of iterative reconstruction technique. COMPARISON:  07/14/2020 FINDINGS: CT HEAD FINDINGS Brain: Acute right parietal subdural hematoma is identified, image 33/4. This has a maximum thickness of 6 mm, image 33/4. Mild mass effect upon the underlying cerebral hemisphere with approximately 2 mm of midline shift. Tiny left subdural hematoma is also noted overlying the inferior left parietal lobe measuring 3 mm, image 19/2. No signs of intraventricular hemorrhage. Prominence of the sulci and ventricles compatible with atrophy. There is mild diffuse low-attenuation within the subcortical and periventricular white matter compatible with chronic microvascular disease. Vascular: No hyperdense vessel or unexpected calcification. Skull: Normal. Negative for fracture or focal lesion. Sinuses/Orbits: The paranasal sinuses and mastoid air cells are clear. Other: None. CT CERVICAL SPINE FINDINGS Alignment: Normal. Skull base and vertebrae: No acute fracture. No primary bone lesion or focal pathologic process. Soft tissues and spinal canal: No prevertebral fluid or swelling. No visible canal hematoma. Disc levels: Multilevel disc space narrowing and endplate spurring noted at C4-5 through C6-7. Upper chest: Negative. Other: None IMPRESSION: 1. Acute right parietal subdural hematoma with maximum thickness of 6 mm. Mild mass effect upon the underlying cerebral hemisphere with approximately 2 mm of midline shift. 2. Tiny left subdural hematoma overlying the inferior left parietal lobe also noted. 3. Chronic small vessel ischemic disease and atrophy. 4. No evidence for cervical spine fracture or subluxation. 5. Cervical degenerative disc disease. Critical Value/emergent results were called by  telephone at the time of interpretation on 06/27/2021 at 11:30 am to provider [facility] , who verbally acknowledged these results. Electronically Signed   By: Kerby Moors M.D.   On: 06/27/2021 11:30   CT Abdomen Pelvis W Contrast  Result Date: 06/27/2021 CLINICAL DATA:  Right lower quadrant pain for 5 months EXAM: CT ABDOMEN AND PELVIS WITH CONTRAST TECHNIQUE: Multidetector CT imaging of the abdomen and pelvis was performed using the standard protocol following bolus administration of intravenous contrast. RADIATION DOSE REDUCTION: This exam was performed according to the departmental dose-optimization program which includes automated exposure control, adjustment of the mA and/or kV according to patient size and/or use of iterative reconstruction technique. CONTRAST:  18mL OMNIPAQUE IOHEXOL 300 MG/ML  SOLN COMPARISON:  06/29/2012 06/29/2012 FINDINGS: Lower chest: No acute finding. Mild subpleural reticulation at the bases. Coronary atherosclerosis. Hepatobiliary: No focal liver abnormality.Cholecystectomy. No bile duct dilatation. Pancreas: Ill-defined low-density mass at the body with upstream  pancreatic atrophy, mass roughly 3.1 cm in length. The mass infiltrates the peripancreatic fat, especially inferiorly. Associated splenic vein occlusion with collaterals. Spleen: Unremarkable. Adrenals/Urinary Tract: Negative adrenals. No hydronephrosis or stone. Left renal cystic density. Unremarkable bladder. Stomach/Bowel: Negative for bowel obstruction or visible inflammation. Extensive sigmoid diverticulosis. Reticulation fat around the rectal and sigmoid segments is primarily from peritoneal disease. No suspected colitis. No appendicitis. Vascular/Lymphatic: Extensive atheromatous calcification of the aorta and branch vessels. Chronic splenic vein occlusion with collaterals as noted above. No noted adenopathy Reproductive:Unremarkable for age Other: Reticulation of the omentum diffusely with small volume  peritoneal fluid in the pelvis where there is peritoneal thickening and enhancement. No measurable nodule. Musculoskeletal: No acute abnormalities. IMPRESSION: 1. Constellation of findings suggest pancreas adenocarcinoma with peritoneal spread. 2. 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 Electronically Signed   By: Jorje Guild M.D.   On: 06/27/2021 12:44   CT Biopsy  Result Date: 06/16/2021 INDICATION: 88 year old with thrombocytopenia.  Request for bone marrow biopsy. EXAM: CT GUIDED BONE MARROW ASPIRATES AND BIOPSY Physician: Stephan Minister. Anselm Pancoast, MD MEDICATIONS: None. ANESTHESIA/SEDATION: Moderate (conscious) sedation was employed during this procedure. A total of Versed 1.$RemoveBef'0mg'XDUsWCLrFx$  and fentanyl 50 mcg was administered intravenously at the order of the provider performing the procedure. Total intra-service moderate sedation time: 10 minutes. Patient's level of consciousness and vital signs were monitored continuously by radiology nurse throughout the procedure under the supervision of the provider performing the procedure. COMPLICATIONS: None immediate. PROCEDURE: The procedure was explained to the patient. The risks and benefits of the procedure were discussed and the patient's questions were addressed. Informed consent was obtained from the patient. The patient was placed prone on CT table. Images of the pelvis were obtained. The left side of back was prepped and draped in sterile fashion. The skin and left posterior ilium were anesthetized with 1% lidocaine. 11 gauge bone needle was directed into the left ilium with CT guidance. Two aspirates and one core biopsy were obtained. Bandage placed over the puncture site. FINDINGS: Images of the pelvis demonstrate extensive diverticulosis in the sigmoid colon. Trace fluid in the pelvis. Pericolonic densities around the sigmoid colon are suggestive for age-indeterminate inflammatory changes. There is probably a right inguinal  hernia but this area is incompletely imaged. Two foci of gas associated with the right SI joint. Biopsy needle was directed to the posterior left ilium. IMPRESSION: 1. CT-guided bone marrow aspiration and biopsy. 2. Diverticulosis with trace fluid in the pelvis. Pericolonic densities could be related to inflammation of unknown age. Difficult to exclude occult diverticulitis. If there is concern for acute diverticulitis, recommend repeat CT of the abdomen and pelvis with intravascular contrast. These results will be called to the ordering clinician or representative by the Radiologist Assistant, and communication documented in the PACS or [employer]. Electronically Signed   By: Markus Daft M.D.   On: 06/16/2021 10:56   CT BONE MARROW BIOPSY & ASPIRATION  Result Date: 06/16/2021 INDICATION: 88 year old with thrombocytopenia.  Request for bone marrow biopsy. EXAM: CT GUIDED BONE MARROW ASPIRATES AND BIOPSY Physician: Stephan Minister. Anselm Pancoast, MD MEDICATIONS: None. ANESTHESIA/SEDATION: Moderate (conscious) sedation was employed during this procedure. A total of Versed 1.$RemoveBef'0mg'dDDRPOWhbo$  and fentanyl 50 mcg was administered intravenously at the order of the provider performing the procedure. Total intra-service moderate sedation time: 10 minutes. Patient's level of consciousness and vital signs were monitored continuously by radiology nurse throughout the procedure under the supervision of the provider performing the procedure. COMPLICATIONS:  None immediate. PROCEDURE: The procedure was explained to the patient. The risks and benefits of the procedure were discussed and the patient's questions were addressed. Informed consent was obtained from the patient. The patient was placed prone on CT table. Images of the pelvis were obtained. The left side of back was prepped and draped in sterile fashion. The skin and left posterior ilium were anesthetized with 1% lidocaine. 11 gauge bone needle was directed into the left ilium with CT guidance.  Two aspirates and one core biopsy were obtained. Bandage placed over the puncture site. FINDINGS: Images of the pelvis demonstrate extensive diverticulosis in the sigmoid colon. Trace fluid in the pelvis. Pericolonic densities around the sigmoid colon are suggestive for age-indeterminate inflammatory changes. There is probably a right inguinal hernia but this area is incompletely imaged. Two foci of gas associated with the right SI joint. Biopsy needle was directed to the posterior left ilium. IMPRESSION: 1. CT-guided bone marrow aspiration and biopsy. 2. Diverticulosis with trace fluid in the pelvis. Pericolonic densities could be related to inflammation of unknown age. Difficult to exclude occult diverticulitis. If there is concern for acute diverticulitis, recommend repeat CT of the abdomen and pelvis with intravascular contrast. These results will be called to the ordering clinician or representative by the Radiologist Assistant, and communication documented in the PACS or [employer]. Electronically Signed   By: Markus Daft M.D.   On: 06/16/2021 10:56   DG HIP UNILAT WITH PELVIS 2-3 VIEWS RIGHT  Result Date: 06/27/2021 CLINICAL DATA:  Fall.  Right-sided abdominal pain. EXAM: DG HIP (WITH OR WITHOUT PELVIS) 2-3V RIGHT COMPARISON:  None FINDINGS: There is no evidence of hip fracture or dislocation. Mild right hip osteoarthritis. Soft tissues are unremarkable. IMPRESSION: 1. No acute findings. 2. Mild right hip osteoarthritis. Electronically Signed   By: Kerby Moors M.D.   On: 06/27/2021 11:40       IMPRESSION/PLAN: 1. Probable stage IV pancreatic adenocarcinoma. Dr. Lisbeth Renshaw discusses the pathology findings and reviews the nature of pancreatic masses and the concerns that these findings represent pancreatic cancer. The omental fullness is also suspicious and should be sampled as well as his primary tumor. Dr. Lisbeth Renshaw outlines the options of therapy could be stereotactic body radiotherapy (SBRT), chemoRT  over 5 1/2 weeks, or 2 weeks of palliative radiotherapy depending on his ongoing work up.  We discussed the risks, benefits, short, and long term effects of radiotherapy, as well as the differences in intent, and we will follow-up with the patient's ongoing work-up.     Given current concerns for patient exposure during the COVID-19 pandemic, this encounter was conducted via telephone.  The patient has provided two factor identification and has given verbal consent for this type of encounter and has been advised to only accept a meeting of this type in a secure network environment. The time spent during this encounter was 60 minutes including preparation, discussion, and coordination of the patient's care. The attendants for this meeting include Blenda Nicely, RN, Dr. Lisbeth Renshaw, Hayden Pedro  and Luther Redo, Marguerita Beards, and Marcy Salvo.  During the encounter,  Blenda Nicely, RN, Dr. Lisbeth Renshaw, and Hayden Pedro were located at [facility] Radiation Oncology Department.  Luther Redo was located at home with his wife Wyatt [name] and granddaughter Marcy Salvo.   The above documentation reflects my direct findings during this shared patient visit. Please see the separate note by Dr. Lisbeth Renshaw on this date for the remainder of the  patient's plan of care.    Carola Rhine, [facility]   **Disclaimer: This note was dictated with voice recognition software. Similar sounding words can inadvertently be transcribed and this note may contain transcription errors which may not have been corrected upon publication of note.**

## 2021-07-07 ENCOUNTER — Ambulatory Visit (HOSPITAL_COMMUNITY): Payer: Medicare HMO | Admitting: Hematology

## 2021-07-07 ENCOUNTER — Encounter (HOSPITAL_COMMUNITY): Payer: Self-pay

## 2021-07-07 NOTE — Progress Notes (Signed)
Patient Name  Evie, Crumpler Legal Sex  Male DOB  1934-01-28 SSN  [id number] Address  [address] [place]  [place] [address] Phone  [phone] [facility] [place])  [phone] (Mobile)    RE: CT BIOPSY Received: Today Markus Daft, MD  Diana Eves M Schedule for CT guided omental biopsy.   [place]        Previous Messages   ----- Message -----  From: Valli Glance  Sent: 07/06/2021   3:47 PM EST  To: Markus Daft, MD  Subject: CT BIOPSY                                       Dr. Anselm Pancoast does this need to be [place] or CT???    Request to IR for pancreatic mass biopsy -- patient imaging reviewed by Dr. Anselm Pancoast today who does not approve pancreatic mass biopsy (not typically done in IR here), but does approve omental biopsy to be done as an outpatient given recent SDH and need for sedation for biopsy.     Discussed with primary team today, order placed for schedulers to call patient to arrange biopsy time, IR contact info placed in AVS.

## 2021-07-08 ENCOUNTER — Ambulatory Visit
Admit: 2021-07-08 | Discharge: 2021-07-08 | Disposition: A | Discharge status: Home / Self Care | Payer: Medicare HMO | Attending: Radiation Oncology | Admitting: Radiation Oncology

## 2021-07-08 ENCOUNTER — Other Ambulatory Visit: Payer: Self-pay

## 2021-07-08 ENCOUNTER — Encounter: Payer: Self-pay | Admitting: Radiation Oncology

## 2021-07-08 ENCOUNTER — Ambulatory Visit
Admission: RE | Admit: 2021-07-08 | Discharge: 2021-07-08 | Disposition: A | Discharge status: Home / Self Care | Payer: Medicare HMO | Source: Ambulatory Visit | Attending: Radiation Oncology | Admitting: Radiation Oncology

## 2021-07-08 DIAGNOSIS — C259 Malignant neoplasm of pancreas, unspecified: Secondary | ICD-10-CM | POA: Diagnosis not present

## 2021-07-08 DIAGNOSIS — S065X9D Traumatic subdural hemorrhage with loss of consciousness of unspecified duration, subsequent encounter: Secondary | ICD-10-CM | POA: Diagnosis not present

## 2021-07-08 DIAGNOSIS — I2510 Atherosclerotic heart disease of native coronary artery without angina pectoris: Secondary | ICD-10-CM | POA: Diagnosis not present

## 2021-07-08 DIAGNOSIS — I10 Essential (primary) hypertension: Secondary | ICD-10-CM | POA: Diagnosis not present

## 2021-07-08 DIAGNOSIS — C251 Malignant neoplasm of body of pancreas: Secondary | ICD-10-CM | POA: Diagnosis not present

## 2021-07-08 HISTORY — DX: Malignant neoplasm of pancreas, unspecified: C25.9

## 2021-07-08 NOTE — Progress Notes (Signed)
GI Location of Tumor / Histology: Pancreas- Body  Wyatt [name] presented with a month long history of right side abdominal pain.  States the pain started as sharp pains, now they area dull and constant in nature.   CT Abd/Pelvis 06/27/2021: Ill-defined low-density mass at the body with upstream pancreatic atrophy, mass roughly 3.1 cm in length. The mass infiltrates the peripancreatic fat, especially inferiorly.  Associated splenic vein occlusion with collaterals.  CT Head 06/27/2021: Acute right parietal subdural hematoma with maximum thickness of 6 mm. Mild mass effect upon the underlying cerebral hemisphere with approximately 2 mm of midline shift.  Tiny left subdural hematoma overlying the inferior left parietal lobe also noted.  Chronic small vessel ischemic disease and atrophy.  No evidence for cervical spine fracture or subluxation.   Biopsies of Pancreas Body 07/21/2021   Past/Anticipated interventions by surgeon, if any:    Past/Anticipated interventions by medical oncology, if any:  Dr. Delton Coombes 07/14/2021   Weight changes, if any: He has lost about 30 pounds over the past 3-6 months.  Bowel/Bladder complaints, if any: He has had some constipation  Nausea / Vomiting, if any: No  Pain issues, if any:  RUQ abdomen, radiating towards back/rib area.  Worse at night and laying down. Taking Hydrocodone as needed.   SAFETY ISSUES: Prior radiation? No Pacemaker/ICD? No Possible current pregnancy? N/a Is the patient on methotrexate? No  Current Complaints/Details: -Lives alone with wife, family available to help. -Will be seen by PT-

## 2021-07-09 DIAGNOSIS — I10 Essential (primary) hypertension: Secondary | ICD-10-CM | POA: Diagnosis not present

## 2021-07-09 DIAGNOSIS — I2510 Atherosclerotic heart disease of native coronary artery without angina pectoris: Secondary | ICD-10-CM | POA: Diagnosis not present

## 2021-07-09 DIAGNOSIS — E1140 Type 2 diabetes mellitus with diabetic neuropathy, unspecified: Secondary | ICD-10-CM | POA: Diagnosis not present

## 2021-07-10 DIAGNOSIS — C259 Malignant neoplasm of pancreas, unspecified: Secondary | ICD-10-CM | POA: Diagnosis not present

## 2021-07-10 DIAGNOSIS — S065X0A Traumatic subdural hemorrhage without loss of consciousness, initial encounter: Secondary | ICD-10-CM | POA: Diagnosis not present

## 2021-07-10 DIAGNOSIS — D696 Thrombocytopenia, unspecified: Secondary | ICD-10-CM | POA: Diagnosis not present

## 2021-07-10 DIAGNOSIS — E871 Hypo-osmolality and hyponatremia: Secondary | ICD-10-CM | POA: Diagnosis not present

## 2021-07-14 ENCOUNTER — Other Ambulatory Visit: Payer: Self-pay

## 2021-07-14 ENCOUNTER — Inpatient Hospital Stay (HOSPITAL_COMMUNITY): Payer: Medicare HMO

## 2021-07-14 ENCOUNTER — Inpatient Hospital Stay (HOSPITAL_COMMUNITY): Payer: Medicare HMO | Attending: Hematology | Admitting: Hematology

## 2021-07-14 ENCOUNTER — Encounter (HOSPITAL_COMMUNITY): Payer: Self-pay

## 2021-07-14 ENCOUNTER — Telehealth: Payer: Self-pay | Admitting: Licensed Clinical Social Worker

## 2021-07-14 VITALS — BP 141/78 | HR 78 | Temp 96.8°F | Resp 17 | Ht 72.0 in | Wt 142.8 lb

## 2021-07-14 DIAGNOSIS — E119 Type 2 diabetes mellitus without complications: Secondary | ICD-10-CM | POA: Diagnosis not present

## 2021-07-14 DIAGNOSIS — Z7984 Long term (current) use of oral hypoglycemic drugs: Secondary | ICD-10-CM | POA: Insufficient documentation

## 2021-07-14 DIAGNOSIS — D4620 Refractory anemia with excess of blasts, unspecified: Secondary | ICD-10-CM | POA: Diagnosis not present

## 2021-07-14 DIAGNOSIS — K869 Disease of pancreas, unspecified: Secondary | ICD-10-CM | POA: Insufficient documentation

## 2021-07-14 DIAGNOSIS — C259 Malignant neoplasm of pancreas, unspecified: Secondary | ICD-10-CM | POA: Diagnosis not present

## 2021-07-14 DIAGNOSIS — E871 Hypo-osmolality and hyponatremia: Secondary | ICD-10-CM

## 2021-07-14 DIAGNOSIS — D696 Thrombocytopenia, unspecified: Secondary | ICD-10-CM | POA: Diagnosis not present

## 2021-07-14 LAB — CBC WITH DIFFERENTIAL/PLATELET
Abs Immature Granulocytes: 0.03 10*3/uL (ref 0.00–0.07)
Basophils Absolute: 0.1 10*3/uL (ref 0.0–0.1)
Basophils Relative: 1 %
Eosinophils Absolute: 0.1 10*3/uL (ref 0.0–0.5)
Eosinophils Relative: 1 %
HCT: 39.2 % (ref 39.0–52.0)
Hemoglobin: 12.5 g/dL — ABNORMAL LOW (ref 13.0–17.0)
Immature Granulocytes: 0 %
Lymphocytes Relative: 9 %
Lymphs Abs: 0.7 10*3/uL (ref 0.7–4.0)
MCH: 27.5 pg (ref 26.0–34.0)
MCHC: 31.9 g/dL (ref 30.0–36.0)
MCV: 86.2 fL (ref 80.0–100.0)
Monocytes Absolute: 0.6 10*3/uL (ref 0.1–1.0)
Monocytes Relative: 8 %
Neutro Abs: 6.4 10*3/uL (ref 1.7–7.7)
Neutrophils Relative %: 81 %
Platelets: 96 10*3/uL — ABNORMAL LOW (ref 150–400)
RBC: 4.55 MIL/uL (ref 4.22–5.81)
RDW: 13.1 % (ref 11.5–15.5)
WBC: 7.9 10*3/uL (ref 4.0–10.5)
nRBC: 0 % (ref 0.0–0.2)

## 2021-07-14 LAB — COMPREHENSIVE METABOLIC PANEL
ALT: 19 U/L (ref 0–44)
AST: 17 U/L (ref 15–41)
Albumin: 3.4 g/dL — ABNORMAL LOW (ref 3.5–5.0)
Alkaline Phosphatase: 87 U/L (ref 38–126)
Anion gap: 11 (ref 5–15)
BUN: 14 mg/dL (ref 8–23)
CO2: 26 mmol/L (ref 22–32)
Calcium: 8.9 mg/dL (ref 8.9–10.3)
Chloride: 93 mmol/L — ABNORMAL LOW (ref 98–111)
Creatinine, Ser: 0.7 mg/dL (ref 0.61–1.24)
GFR, Estimated: >60 mL/min (ref >60)
Glucose, Bld: 168 mg/dL — ABNORMAL HIGH (ref 70–99)
Potassium: 4.3 mmol/L (ref 3.5–5.1)
Sodium: 130 mmol/L — ABNORMAL LOW (ref 135–145)
Total Bilirubin: 0.7 mg/dL (ref 0.3–1.2)
Total Protein: 6.4 g/dL — ABNORMAL LOW (ref 6.5–8.1)

## 2021-07-14 LAB — MAGNESIUM: Magnesium: 1.5 mg/dL — ABNORMAL LOW (ref 1.7–2.4)

## 2021-07-14 LAB — GENETIC SCREENING ORDER

## 2021-07-14 MED ORDER — METFORMIN HCL 1000 MG PO TABS: 1000.0000 mg | ORAL_TABLET | Freq: Two times a day (BID) | ORAL | 1 refills | Status: DC

## 2021-07-14 NOTE — Progress Notes (Signed)
[facility] [address] [place] [address]   CLINIC:  Medical Oncology/Hematology  CONSULT NOTE  Patient Care Team: Asencion Noble, MD as PCP - General (Internal Medicine) Derek Jack, MD as Medical Oncologist (Medical Oncology) Brien Mates, RN as Oncology Nurse Navigator (Medical Oncology)  CHIEF COMPLAINTS/PURPOSE OF CONSULTATION:  Evaluation of highly likely metastatic pancreatic cancer and low grade MDS  HISTORY OF PRESENTING ILLNESS:  [name] 88 y.o. male is here because of evaluation of highly likely metastatic pancreatic cancer and low grade MDS.  Today he reports feeling well, and he is accompanied by his daughter and granddaughter. He reports severe right abdominal pain below his ribs and radiating to his back which is no longer helped by 2-3 Norco tablets prn; this pain is worst at night and has worsened over the past week. He reports his pain is worse when laying down. His granddaughter reports he is experiencing constipation, intermittent headaches, and he is unsteady while walking. He is currently walking with the assistance of a cane. He is eating small portions 3 times a day.   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 He denies chemical exposure. He quit smoking 55 years ago. His mother had anemia and received vitamin B-12 injections. His brother had kidney cancer, a sister had uterine cancer, and another sister had ovarian cancer.   MEDICAL HISTORY:  Past Medical History:  Diagnosis Date   Diabetes mellitus    Hypercholesteremia    Hypertension    Pancreas cancer ([place])     SURGICAL HISTORY: Past Surgical History:  Procedure Laterality Date   CARDIAC CATHETERIZATION     CHOLECYSTECTOMY N/A 06/26/2012   Procedure: LAPAROSCOPIC CHOLECYSTECTOMY;  Surgeon: Donato Heinz, MD;  Location: [facility];  Service: General;  Laterality: N/A;   COLONOSCOPY  06/10/2011   Procedure: COLONOSCOPY;  Surgeon: Rogene Houston, MD;   Location: [facility];  Service: Endoscopy;  Laterality: N/A;  1030   ESOPHAGEAL DILATION N/A 07/18/2019   Procedure: ESOPHAGEAL DILATION;  Surgeon: Rogene Houston, MD;  Location: [facility];  Service: Endoscopy;  Laterality: N/A;   ESOPHAGOGASTRODUODENOSCOPY N/A 07/18/2019   Procedure: ESOPHAGOGASTRODUODENOSCOPY (EGD);  Surgeon: Rogene Houston, MD;  Location: [facility];  Service: Endoscopy;  Laterality: N/A;  215 - moved to 8:30 per Lelon Frohlich, pt is aware    SOCIAL HISTORY: Social History   Socioeconomic History   Marital status: Married    Spouse name: Not on file   Number of children: Not on file   Years of education: Not on file   Highest education level: Not on file  Occupational History   Not on file  Tobacco Use   Smoking status: Former    Packs/day: 1.00    Years: 7.00    Pack years: 7.00    Types: Cigarettes   Smokeless tobacco: Former  Scientific laboratory technician Use: Never used  Substance and Sexual Activity   Alcohol use: No   Drug use: No   Sexual activity: Yes    Birth control/protection: None  Other Topics Concern   Not on file  Social History Narrative   Not on file   Social Determinants of Health   Financial Resource Strain: Not on file  Food Insecurity: Not on file  Transportation Needs: Not on file  Physical Activity: Not on file  Stress: Not on file  Social Connections: Not on file  Intimate Partner Violence: Not on file  FAMILY HISTORY: Family History  Problem Relation Age of Onset   Colon cancer Mother     ALLERGIES:  has No Known Allergies.  MEDICATIONS:  Current Outpatient Medications  Medication Sig Dispense Refill   docusate sodium (COLACE) 100 MG capsule Take 100 mg by mouth 2 (two) times daily.     HYDROcodone-acetaminophen (NORCO) 7.5-325 MG tablet Take 1 tablet by mouth every 6 (six) hours as needed for moderate pain. 120 tablet 0   ibuprofen (ADVIL) 200 MG tablet Take 200 mg by mouth every 6 (six) hours as needed.      lisinopril (PRINIVIL,ZESTRIL) 40 MG tablet Take 40 mg by mouth daily.      metFORMIN (GLUCOPHAGE) 1000 MG tablet Take 500 mg by mouth 2 (two) times daily.     metoprolol tartrate (LOPRESSOR) 25 MG tablet Take 12.5 mg by mouth 2 (two) times daily.     pantoprazole (PROTONIX) 40 MG tablet Take 1 tablet (40 mg total) by mouth daily before breakfast. 30 tablet 5   simvastatin (ZOCOR) 20 MG tablet Take 20 mg by mouth daily.      sodium chloride 1 g tablet Take 1 tablet (1 g total) by mouth 3 (three) times daily with meals. 90 tablet 0   nitroGLYCERIN (NITROSTAT) 0.4 MG SL tablet Place 0.4 mg under the tongue every 5 (five) minutes as needed for chest pain. (Patient not taking: Reported on 07/14/2021)     No current facility-administered medications for this visit.    REVIEW OF SYSTEMS:   Review of Systems  Constitutional:  Negative for appetite change and fatigue.  Gastrointestinal:  Positive for abdominal pain (8/10 R lower) and constipation.  Musculoskeletal:  Positive for gait problem (unsteady).  Neurological:  Positive for gait problem (unsteady) and headaches.  Psychiatric/Behavioral:  Positive for sleep disturbance.   All other systems reviewed and are negative.   PHYSICAL EXAMINATION: ECOG PERFORMANCE STATUS: 1 - Symptomatic but completely ambulatory  Vitals:   07/14/21 0806  BP: (!) 141/78  Pulse: 78  Resp: 17  Temp: (!) 96.8 F (36 C)  SpO2: 98%   Filed Weights   07/14/21 0806  Weight: 142 lb 12.8 oz (64.8 kg)   Physical Exam Vitals reviewed.  Constitutional:      Appearance: Normal appearance.  Cardiovascular:     Rate and Rhythm: Normal rate and regular rhythm.     Pulses: Normal pulses.     Heart sounds: Normal heart sounds.  Pulmonary:     Effort: Pulmonary effort is normal.     Breath sounds: Normal breath sounds.  Abdominal:     Palpations: Abdomen is soft. There is no hepatomegaly, splenomegaly or mass.     Tenderness: There is abdominal tenderness.   Musculoskeletal:     Right lower leg: No edema.     Left lower leg: No edema.  Neurological:     General: No focal deficit present.     Mental Status: He is alert and oriented to person, place, and time.  Psychiatric:        Mood and Affect: Mood normal.        Behavior: Behavior normal.     LABORATORY DATA:  I have reviewed the data as listed CBC Latest Ref Rng & Units 06/30/2021 06/28/2021 06/28/2021  WBC 4.0 - 10.5 K/uL 8.8 8.6 4.8  Hemoglobin 13.0 - 17.0 g/dL 11.0(L) 12.4(L) 12.2(L)  Hematocrit 39.0 - 52.0 % 32.0(L) 35.9(L) 36.1(L)  Platelets 150 - 400 K/uL 123(L) 69(L) 53(L)  CMP Latest Ref Rng & Units 07/03/2021 07/02/2021 07/01/2021  Glucose 70 - 99 mg/dL 225(H) 158(H) 187(H)  BUN 8 - 23 mg/dL $Remove'19 21 19  'YuPSOVt$ Creatinine 0.61 - 1.24 mg/dL 0.89 0.78 0.90  Sodium 135 - 145 mmol/L 127(L) 125(L) 128(L)  Potassium 3.5 - 5.1 mmol/L 3.4(L) 3.4(L) 3.5  Chloride 98 - 111 mmol/L 90(L) 93(L) 93(L)  CO2 22 - 32 mmol/L $RemoveB'28 24 26  'ROVaQEko$ Calcium 8.9 - 10.3 mg/dL 8.6(L) 7.9(L) 8.5(L)  Total Protein 6.5 - 8.1 g/dL - - -  Total Bilirubin 0.3 - 1.2 mg/dL - - -  Alkaline Phos 38 - 126 U/L - - -  AST 15 - 41 U/L - - -  ALT 0 - 44 U/L - - -    RADIOGRAPHIC STUDIES: I have personally reviewed the radiological images as listed and agreed with the findings in the report. DG Chest 1 View  Result Date: 06/27/2021 CLINICAL DATA:  Status post fall. EXAM: CHEST  1 VIEW COMPARISON:  06/23/2012 FINDINGS: The heart size and mediastinal contours are within normal limits. Aortic atherosclerosis. Both lungs are clear. The visualized skeletal structures are unremarkable. IMPRESSION: No active disease. Electronically Signed   By: Kerby Moors M.D.   On: 06/27/2021 11:41   CT HEAD WO CONTRAST  Result Date: 06/28/2021 CLINICAL DATA:  89 year old male with history of bilateral subdural hematomas. Follow-up study. EXAM: CT HEAD WITHOUT CONTRAST TECHNIQUE: Contiguous axial images were obtained from the base of the skull  through the vertex without intravenous contrast. RADIATION DOSE REDUCTION: This exam was performed according to the departmental dose-optimization program which includes automated exposure control, adjustment of the mA and/or kV according to patient size and/or use of iterative reconstruction technique. COMPARISON:  Head CT 06/27/2021. FINDINGS: Brain: There is again a trace left subdural high attenuation fluid collection adjacent to the sylvian fissure (coronal image 38 of series 5) measuring 2 mm in thickness, stable. Right-sided subdural high attenuation fluid collection adjacent to the right posterior frontal and parietal region (coronal image 29 of series 5), also similar to the prior study measuring up to 7 mm in thickness on today's examination. No new acute intracranial hemorrhage otherwise noted. No intraventricular blood. Moderate cerebral atrophy with ex vacuo dilatation of the ventricular system. Patchy and confluent areas of decreased attenuation are noted throughout the deep and periventricular white matter of the cerebral hemispheres bilaterally, compatible with chronic microvascular ischemic disease. Vascular: Extensive atherosclerosis in the cerebral vasculature. Skull: Normal. Negative for fracture or focal lesion. Sinuses/Orbits: No acute finding. Other: None. IMPRESSION: 1. Stable size and appearance of subdural hematomas bilaterally. No new acute findings are noted. 2. Moderate cerebral atrophy with ex vacuo dilatation of the ventricular system and extensive chronic microvascular ischemic changes in the cerebral white matter. Electronically Signed   By: Vinnie Langton M.D.   On: 06/28/2021 08:00   CT Head Wo Contrast  Result Date: 06/27/2021 CLINICAL DATA:  Status post fall.  Hit head on bathroom floor. EXAM: CT HEAD WITHOUT CONTRAST CT CERVICAL SPINE WITHOUT CONTRAST TECHNIQUE: Multidetector CT imaging of the head and cervical spine was performed following the standard protocol without  intravenous contrast. Multiplanar CT image reconstructions of the cervical spine were also generated. RADIATION DOSE REDUCTION: This exam was performed according to the departmental dose-optimization program which includes automated exposure control, adjustment of the mA and/or kV according to patient size and/or use of iterative reconstruction technique. COMPARISON:  07/14/2020 FINDINGS: CT HEAD FINDINGS Brain: Acute right parietal subdural hematoma is  identified, image 33/4. This has a maximum thickness of 6 mm, image 33/4. Mild mass effect upon the underlying cerebral hemisphere with approximately 2 mm of midline shift. Tiny left subdural hematoma is also noted overlying the inferior left parietal lobe measuring 3 mm, image 19/2. No signs of intraventricular hemorrhage. Prominence of the sulci and ventricles compatible with atrophy. There is mild diffuse low-attenuation within the subcortical and periventricular white matter compatible with chronic microvascular disease. Vascular: No hyperdense vessel or unexpected calcification. Skull: Normal. Negative for fracture or focal lesion. Sinuses/Orbits: The paranasal sinuses and mastoid air cells are clear. Other: None. CT CERVICAL SPINE FINDINGS Alignment: Normal. Skull base and vertebrae: No acute fracture. No primary bone lesion or focal pathologic process. Soft tissues and spinal canal: No prevertebral fluid or swelling. No visible canal hematoma. Disc levels: Multilevel disc space narrowing and endplate spurring noted at C4-5 through C6-7. Upper chest: Negative. Other: None IMPRESSION: 1. Acute right parietal subdural hematoma with maximum thickness of 6 mm. Mild mass effect upon the underlying cerebral hemisphere with approximately 2 mm of midline shift. 2. Tiny left subdural hematoma overlying the inferior left parietal lobe also noted. 3. Chronic small vessel ischemic disease and atrophy. 4. No evidence for cervical spine fracture or subluxation. 5. Cervical  degenerative disc disease. Critical Value/emergent results were called by telephone at the time of interpretation on 06/27/2021 at 11:30 am to provider [facility] , who verbally acknowledged these results. Electronically Signed   By: Kerby Moors M.D.   On: 06/27/2021 11:30   CT Cervical Spine Wo Contrast  Result Date: 06/27/2021 CLINICAL DATA:  Status post fall.  Hit head on bathroom floor. EXAM: CT HEAD WITHOUT CONTRAST CT CERVICAL SPINE WITHOUT CONTRAST TECHNIQUE: Multidetector CT imaging of the head and cervical spine was performed following the standard protocol without intravenous contrast. Multiplanar CT image reconstructions of the cervical spine were also generated. RADIATION DOSE REDUCTION: This exam was performed according to the departmental dose-optimization program which includes automated exposure control, adjustment of the mA and/or kV according to patient size and/or use of iterative reconstruction technique. COMPARISON:  07/14/2020 FINDINGS: CT HEAD FINDINGS Brain: Acute right parietal subdural hematoma is identified, image 33/4. This has a maximum thickness of 6 mm, image 33/4. Mild mass effect upon the underlying cerebral hemisphere with approximately 2 mm of midline shift. Tiny left subdural hematoma is also noted overlying the inferior left parietal lobe measuring 3 mm, image 19/2. No signs of intraventricular hemorrhage. Prominence of the sulci and ventricles compatible with atrophy. There is mild diffuse low-attenuation within the subcortical and periventricular white matter compatible with chronic microvascular disease. Vascular: No hyperdense vessel or unexpected calcification. Skull: Normal. Negative for fracture or focal lesion. Sinuses/Orbits: The paranasal sinuses and mastoid air cells are clear. Other: None. CT CERVICAL SPINE FINDINGS Alignment: Normal. Skull base and vertebrae: No acute fracture. No primary bone lesion or focal pathologic process. Soft tissues and spinal  canal: No prevertebral fluid or swelling. No visible canal hematoma. Disc levels: Multilevel disc space narrowing and endplate spurring noted at C4-5 through C6-7. Upper chest: Negative. Other: None IMPRESSION: 1. Acute right parietal subdural hematoma with maximum thickness of 6 mm. Mild mass effect upon the underlying cerebral hemisphere with approximately 2 mm of midline shift. 2. Tiny left subdural hematoma overlying the inferior left parietal lobe also noted. 3. Chronic small vessel ischemic disease and atrophy. 4. No evidence for cervical spine fracture or subluxation. 5. Cervical degenerative disc disease. Critical Value/emergent results were  called by telephone at the time of interpretation on 06/27/2021 at 11:30 am to provider [facility] , who verbally acknowledged these results. Electronically Signed   By: Kerby Moors M.D.   On: 06/27/2021 11:30   CT Abdomen Pelvis W Contrast  Result Date: 06/27/2021 CLINICAL DATA:  Right lower quadrant pain for 5 months EXAM: CT ABDOMEN AND PELVIS WITH CONTRAST TECHNIQUE: Multidetector CT imaging of the abdomen and pelvis was performed using the standard protocol following bolus administration of intravenous contrast. RADIATION DOSE REDUCTION: This exam was performed according to the departmental dose-optimization program which includes automated exposure control, adjustment of the mA and/or kV according to patient size and/or use of iterative reconstruction technique. CONTRAST:  13mL OMNIPAQUE IOHEXOL 300 MG/ML  SOLN COMPARISON:  06/29/2012 06/29/2012 FINDINGS: Lower chest: No acute finding. Mild subpleural reticulation at the bases. Coronary atherosclerosis. Hepatobiliary: No focal liver abnormality.Cholecystectomy. No bile duct dilatation. Pancreas: Ill-defined low-density mass at the body with upstream pancreatic atrophy, mass roughly 3.1 cm in length. The mass infiltrates the peripancreatic fat, especially inferiorly. Associated splenic vein occlusion  with collaterals. Spleen: Unremarkable. Adrenals/Urinary Tract: Negative adrenals. No hydronephrosis or stone. Left renal cystic density. Unremarkable bladder. Stomach/Bowel: Negative for bowel obstruction or visible inflammation. Extensive sigmoid diverticulosis. Reticulation fat around the rectal and sigmoid segments is primarily from peritoneal disease. No suspected colitis. No appendicitis. Vascular/Lymphatic: Extensive atheromatous calcification of the aorta and branch vessels. Chronic splenic vein occlusion with collaterals as noted above. No noted adenopathy Reproductive:Unremarkable for age Other: Reticulation of the omentum diffusely with small volume peritoneal fluid in the pelvis where there is peritoneal thickening and enhancement. No measurable nodule. Musculoskeletal: No acute abnormalities. IMPRESSION: 1. Constellation of findings suggest pancreas adenocarcinoma with peritoneal spread. 2. 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 Electronically Signed   By: Jorje Guild M.D.   On: 06/27/2021 12:44   CT Biopsy  Result Date: 06/16/2021 INDICATION: 88 year old with thrombocytopenia.  Request for bone marrow biopsy. EXAM: CT GUIDED BONE MARROW ASPIRATES AND BIOPSY Physician: Stephan Minister. Anselm Pancoast, MD MEDICATIONS: None. ANESTHESIA/SEDATION: Moderate (conscious) sedation was employed during this procedure. A total of Versed 1.$RemoveBef'0mg'ifxjBzlADo$  and fentanyl 50 mcg was administered intravenously at the order of the provider performing the procedure. Total intra-service moderate sedation time: 10 minutes. Patient's level of consciousness and vital signs were monitored continuously by radiology nurse throughout the procedure under the supervision of the provider performing the procedure. COMPLICATIONS: None immediate. PROCEDURE: The procedure was explained to the patient. The risks and benefits of the procedure were discussed and the patient's questions were addressed. Informed  consent was obtained from the patient. The patient was placed prone on CT table. Images of the pelvis were obtained. The left side of back was prepped and draped in sterile fashion. The skin and left posterior ilium were anesthetized with 1% lidocaine. 11 gauge bone needle was directed into the left ilium with CT guidance. Two aspirates and one core biopsy were obtained. Bandage placed over the puncture site. FINDINGS: Images of the pelvis demonstrate extensive diverticulosis in the sigmoid colon. Trace fluid in the pelvis. Pericolonic densities around the sigmoid colon are suggestive for age-indeterminate inflammatory changes. There is probably a right inguinal hernia but this area is incompletely imaged. Two foci of gas associated with the right SI joint. Biopsy needle was directed to the posterior left ilium. IMPRESSION: 1. CT-guided bone marrow aspiration and biopsy. 2. Diverticulosis with trace fluid in the pelvis. Pericolonic densities could be related to  inflammation of unknown age. Difficult to exclude occult diverticulitis. If there is concern for acute diverticulitis, recommend repeat CT of the abdomen and pelvis with intravascular contrast. These results will be called to the ordering clinician or representative by the Radiologist Assistant, and communication documented in the PACS or [employer]. Electronically Signed   By: Markus Daft M.D.   On: 06/16/2021 10:56   CT BONE MARROW BIOPSY & ASPIRATION  Result Date: 06/16/2021 INDICATION: 88 year old with thrombocytopenia.  Request for bone marrow biopsy. EXAM: CT GUIDED BONE MARROW ASPIRATES AND BIOPSY Physician: Stephan Minister. Anselm Pancoast, MD MEDICATIONS: None. ANESTHESIA/SEDATION: Moderate (conscious) sedation was employed during this procedure. A total of Versed 1.$RemoveBef'0mg'LaBjofHBhJ$  and fentanyl 50 mcg was administered intravenously at the order of the provider performing the procedure. Total intra-service moderate sedation time: 10 minutes. Patient's level of consciousness  and vital signs were monitored continuously by radiology nurse throughout the procedure under the supervision of the provider performing the procedure. COMPLICATIONS: None immediate. PROCEDURE: The procedure was explained to the patient. The risks and benefits of the procedure were discussed and the patient's questions were addressed. Informed consent was obtained from the patient. The patient was placed prone on CT table. Images of the pelvis were obtained. The left side of back was prepped and draped in sterile fashion. The skin and left posterior ilium were anesthetized with 1% lidocaine. 11 gauge bone needle was directed into the left ilium with CT guidance. Two aspirates and one core biopsy were obtained. Bandage placed over the puncture site. FINDINGS: Images of the pelvis demonstrate extensive diverticulosis in the sigmoid colon. Trace fluid in the pelvis. Pericolonic densities around the sigmoid colon are suggestive for age-indeterminate inflammatory changes. There is probably a right inguinal hernia but this area is incompletely imaged. Two foci of gas associated with the right SI joint. Biopsy needle was directed to the posterior left ilium. IMPRESSION: 1. CT-guided bone marrow aspiration and biopsy. 2. Diverticulosis with trace fluid in the pelvis. Pericolonic densities could be related to inflammation of unknown age. Difficult to exclude occult diverticulitis. If there is concern for acute diverticulitis, recommend repeat CT of the abdomen and pelvis with intravascular contrast. These results will be called to the ordering clinician or representative by the Radiologist Assistant, and communication documented in the PACS or [employer]. Electronically Signed   By: Markus Daft M.D.   On: 06/16/2021 10:56   DG HIP UNILAT WITH PELVIS 2-3 VIEWS RIGHT  Result Date: 06/27/2021 CLINICAL DATA:  Fall.  Right-sided abdominal pain. EXAM: DG HIP (WITH OR WITHOUT PELVIS) 2-3V RIGHT COMPARISON:  None FINDINGS:  There is no evidence of hip fracture or dislocation. Mild right hip osteoarthritis. Soft tissues are unremarkable. IMPRESSION: 1. No acute findings. 2. Mild right hip osteoarthritis. Electronically Signed   By: Kerby Moors M.D.   On: 06/27/2021 11:40    ASSESSMENT:  Highly likely metastatic pancreatic cancer: - Presentation with right-sided abdominal pain for the last 1 month, nagging type. - Lost 10 pounds in the last year. - CTAP on 06/27/2021 with ill-defined low-density mass in the body measuring 3.1 cm.  Mass infiltrates peripancreatic fat especially inferiorly.  Associated splenic vein occlusion with collaterals.  Reticulation of the omentum diffusely with small volume peritoneal fluid in the pelvis where there is peritoneal thickening and enhancement.   Thrombocytopenia/low grade MDS: - Patient seen at the request of Dr. Willey Blade for severe thrombocytopenia. - CBC on 05/19/2021 with platelet count 33.  Hemoglobin, white cells were within normal limits. -  CBC on 09/03/2020 with platelet count 192, hemoglobin 15.2, white count 7.1. - Bone marrow biopsy on 05/22/2021: Hypercellular marrow with increased numbers of megakaryocytes with frequent hypolobated and widely separated nuclear lobes.  Low-grade MDS with minimal cytological atypia may be considered if nonclonal causes are excluded.  Chromosome analysis was 46, XY.  Only 6 metaphase cells are available for analysis.    Social/family history: - Lives at home with his wife.  He is a retired Furniture conservator/restorer.  He quit smoking 50 years ago. - Brother had kidney cancer.  Sister had uterine cancer and another sister had ovarian cancer.   PLAN:  Highly likely metastatic pancreatic cancer: - Unfortunately his CT scan found to have pancreatic mass with possible peritoneal spread.  His CA 19-9 is more than 10,000 indicating strong possibility of metastatic pancreatic cancer. - Recommend biopsy of the peritoneal area.  He is scheduled for it on 07/21/2021. -  Recommend PET CT scan. - Recommend genetic testing. - RTC after biopsy to discuss plan.   Low grade MDS: - We have reviewed bone marrow biopsy results which showed possibility of mild MDS with some atypia and increased number of megakaryocytes. - His platelet count is staying at 90-100k.  A competent of immune mediated thrombocytopenia can also be considered.  CT scan showed normal-sized spleen.    Right upper quadrant pain: - Continue hydrocodone 7.5/325 every 6 hours as needed.  4.  Nutrition/diabetes: - He is eating small meals up to 3 times per day.  He is able to eat some ice cream.  His sugars are staying high lately.  A1c is also elevated. - He is currently on metformin 500 mg twice daily.  Will increase it to 1000 mg twice daily.  He was advised to check sugars at home.   All questions were answered. The patient knows to call the clinic with any problems, questions or concerns.   Derek Jack, MD, 07/14/21 8:52 AM  [place] [phone]   I, Thana Ates, am acting as a scribe for Dr. Derek Jack.  I, Derek Jack MD, have reviewed the above documentation for accuracy and completeness, and I agree with the above.

## 2021-07-14 NOTE — Patient Instructions (Addendum)
[place] at [facility] Discharge Instructions  You were seen and examined today by Dr. Delton Coombes. Dr. Delton Coombes is a medical oncologist, meaning that he specializes in the treatment of cancer diagnoses. Dr. Delton Coombes discussed your past medical history, family history of cancers, and the events that led to you being here today.  You were referred back to Dr. Delton Coombes due to possible pancreatic cancer. Dr. Delton Coombes has recommended proceeding with biopsy as scheduled.  You should also have a PET scan. A PET scan is a specialized CT scan that illuminates where there is cancer present in the body, this will accurately identify the stage of your cancer.  Dr. Delton Coombes discussed pain medication. You should be taking your prescribed pain medication every 8 hours to stay on top of your pain.  Dr. Delton Coombes reviewed your recent bone marrow biopsy. This was mostly normal, there were no concerns for cancer arising from the bone marrow.  For nutritional status, add one Glucerna per day to your diet.  Dr. Delton Coombes will recheck your routine labs, including sodium and platelets. Dr. Delton Coombes has also recommended genetic testing.  Follow-up with Dr. Delton Coombes 1 week after biopsy.  Increase Metformin to $RemoveBefo'1000mg'FLndciurExS$  twice daily as long as you are eating at least one Glucerna daily.   Thank you for choosing [place] at [facility] to provide your oncology and hematology care.  To afford each patient quality time with our provider, please arrive at least 15 minutes before your scheduled appointment time.   If you have a lab appointment with the [facility] please come in thru the Main Entrance and check in at the main information desk.  You need to re-schedule your appointment should you arrive 10 or more minutes late.  We strive to give you quality time with our providers, and arriving late affects you and other patients whose appointments are  after yours.  Also, if you no show three or more times for appointments you may be dismissed from the clinic at the providers discretion.     Again, thank you for choosing [facility].  Our hope is that these requests will decrease the amount of time that you wait before being seen by our physicians.       _____________________________________________________________  Should you have questions after your visit to [facility], please contact our office at [phone] and follow the prompts.  Our office hours are 8:00 a.m. and 4:30 p.m. Monday - Friday.  Please note that voicemails left after 4:00 p.m. may not be returned until the following business day.  We are closed weekends and major holidays.  You do have access to a nurse 24-7, just call the main number to the clinic [phone] and do not press any options, hold on the line and a nurse will answer the phone.    For prescription refill requests, have your pharmacy contact our office and allow 72 hours.    Due to Covid, you will need to wear a mask upon entering the hospital. If you do not have a mask, a mask will be given to you at the Main Entrance upon arrival. For doctor visits, patients may have 1 support person age 55 or older with them. For treatment visits, patients can not have anyone with them due to social distancing guidelines and our immunocompromised population.

## 2021-07-14 NOTE — Progress Notes (Signed)
I met with the patient, his daughter and his granddaughter during and following initial visit with Dr. Delton Coombes. I encouraged patient and family to call with questions or concerns.

## 2021-07-14 NOTE — Telephone Encounter (Signed)
[facility] BRCA1/2 STAT panel order placed 07/14/2021, results will be shared with Dr. Delton Coombes once ready, patient is scheduled to see me 3/27

## 2021-07-16 ENCOUNTER — Ambulatory Visit (HOSPITAL_COMMUNITY)
Admission: RE | Admit: 2021-07-16 | Discharge: 2021-07-16 | Disposition: A | Discharge status: Home / Self Care | Payer: Medicare HMO | Source: Ambulatory Visit | Attending: Hematology | Admitting: Hematology

## 2021-07-16 ENCOUNTER — Other Ambulatory Visit: Payer: Self-pay

## 2021-07-16 DIAGNOSIS — C259 Malignant neoplasm of pancreas, unspecified: Secondary | ICD-10-CM | POA: Diagnosis not present

## 2021-07-16 DIAGNOSIS — I2510 Atherosclerotic heart disease of native coronary artery without angina pectoris: Secondary | ICD-10-CM | POA: Diagnosis not present

## 2021-07-16 DIAGNOSIS — E041 Nontoxic single thyroid nodule: Secondary | ICD-10-CM | POA: Diagnosis not present

## 2021-07-16 DIAGNOSIS — K5730 Diverticulosis of large intestine without perforation or abscess without bleeding: Secondary | ICD-10-CM | POA: Diagnosis not present

## 2021-07-16 MED ORDER — FLUDEOXYGLUCOSE F - 18 (FDG) INJECTION
7.8900 | Freq: Once | INTRAVENOUS | Status: AC | PRN
  Administered 2021-07-16: 7.89 via INTRAVENOUS

## 2021-07-20 ENCOUNTER — Other Ambulatory Visit: Payer: Self-pay | Admitting: Radiology

## 2021-07-21 ENCOUNTER — Ambulatory Visit (HOSPITAL_COMMUNITY)
Admission: RE | Admit: 2021-07-21 | Discharge: 2021-07-21 | Disposition: A | Discharge status: Home / Self Care | Payer: Medicare HMO | Source: Ambulatory Visit | Attending: Physician Assistant | Admitting: Physician Assistant

## 2021-07-21 ENCOUNTER — Other Ambulatory Visit: Payer: Self-pay

## 2021-07-21 DIAGNOSIS — E119 Type 2 diabetes mellitus without complications: Secondary | ICD-10-CM | POA: Insufficient documentation

## 2021-07-21 DIAGNOSIS — I10 Essential (primary) hypertension: Secondary | ICD-10-CM | POA: Diagnosis not present

## 2021-07-21 DIAGNOSIS — K668 Other specified disorders of peritoneum: Secondary | ICD-10-CM | POA: Diagnosis not present

## 2021-07-21 DIAGNOSIS — C259 Malignant neoplasm of pancreas, unspecified: Secondary | ICD-10-CM | POA: Insufficient documentation

## 2021-07-21 DIAGNOSIS — E785 Hyperlipidemia, unspecified: Secondary | ICD-10-CM | POA: Diagnosis not present

## 2021-07-21 DIAGNOSIS — D696 Thrombocytopenia, unspecified: Secondary | ICD-10-CM | POA: Insufficient documentation

## 2021-07-21 DIAGNOSIS — R599 Enlarged lymph nodes, unspecified: Secondary | ICD-10-CM | POA: Diagnosis not present

## 2021-07-21 DIAGNOSIS — C786 Secondary malignant neoplasm of retroperitoneum and peritoneum: Secondary | ICD-10-CM | POA: Diagnosis not present

## 2021-07-21 LAB — CBC
HCT: 39 % (ref 39.0–52.0)
Hemoglobin: 12.8 g/dL — ABNORMAL LOW (ref 13.0–17.0)
MCH: 28.1 pg (ref 26.0–34.0)
MCHC: 32.8 g/dL (ref 30.0–36.0)
MCV: 85.5 fL (ref 80.0–100.0)
Platelets: 151 10*3/uL (ref 150–400)
RBC: 4.56 MIL/uL (ref 4.22–5.81)
RDW: 13.1 % (ref 11.5–15.5)
WBC: 7.1 10*3/uL (ref 4.0–10.5)
nRBC: 0 % (ref 0.0–0.2)

## 2021-07-21 LAB — PROTIME-INR
INR: 1.1 (ref 0.8–1.2)
Prothrombin Time: 13.8 seconds (ref 11.4–15.2)

## 2021-07-21 LAB — GLUCOSE, CAPILLARY: Glucose-Capillary: 172 mg/dL — ABNORMAL HIGH (ref 70–99)

## 2021-07-21 LAB — APTT: aPTT: 25 seconds (ref 24–36)

## 2021-07-21 MED ORDER — SODIUM CHLORIDE 0.9 % IV SOLN: INTRAVENOUS | Status: DC

## 2021-07-21 MED ORDER — FENTANYL CITRATE (PF) 100 MCG/2ML IJ SOLN
INTRAMUSCULAR | Status: AC | PRN
  Administered 2021-07-21 (×2): 50 ug via INTRAVENOUS

## 2021-07-21 MED ORDER — FENTANYL CITRATE (PF) 100 MCG/2ML IJ SOLN
INTRAMUSCULAR | Status: AC
  Filled 2021-07-21: qty 2

## 2021-07-21 MED ORDER — LIDOCAINE HCL 1 % IJ SOLN
INTRAMUSCULAR | Status: AC
  Filled 2021-07-21: qty 10

## 2021-07-21 MED ORDER — MIDAZOLAM HCL 2 MG/2ML IJ SOLN
INTRAMUSCULAR | Status: AC | PRN
  Administered 2021-07-21 (×2): 1 mg via INTRAVENOUS

## 2021-07-21 MED ORDER — GELATIN ABSORBABLE 12-7 MM EX MISC
CUTANEOUS | Status: AC
  Filled 2021-07-21: qty 1

## 2021-07-21 MED ORDER — MIDAZOLAM HCL 2 MG/2ML IJ SOLN
INTRAMUSCULAR | Status: AC
  Filled 2021-07-21: qty 2

## 2021-07-21 NOTE — Procedures (Signed)
Interventional Radiology Procedure Note ? ?Procedure: CT Guided Biopsy of omentum ? ?Complications: None ? ?Estimated Blood Loss: < 10 mL ? 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  Three core samples obtained and sent to Pathology. ? ?Eulas Post T. Kathlene Cote, M.D ?Pager:  [phone] ? ?  ?

## 2021-07-21 NOTE — Progress Notes (Signed)
Has follow up 07/28/21

## 2021-07-21 NOTE — H&P (Signed)
Chief Complaint: Hypermetabolic omental caking. Request is for omental biopsy.  Referring Physician(s): Dr. Tomie China  Supervising Physician: Aletta Edouard  Patient Status: [facility] - Out-pt  History of Present Illness: Wyatt [name] is a 88 y.o. male History of  DM, HLD, HTN, metastatic pancreatic cancer now with thrombocytopenia thought to be associated with low grade myelodysplastic syndrome. PET scan from 3.2.23 reads Hypermetabolic mass of the pancreatic body and adjacent tail, with adjacent hypermetabolic adenopathy, hypermetabolic omental caking of tumor, and hypermetabolic ascites compatible with peritoneal spread of disease. Team is requesting biopsy of the peritoneal area for further evaluation and treatment planning. Mental biopsy request approved by IR Attending Dr. Anselm Pancoast.   Currently without any significant complaints. Patient alert and laying in bed, calm and comfortable. Denies any fevers, headache, chest pain, SOB, cough, abdominal pain, nausea, vomiting or bleeding.Return precautions and treatment recommendations and follow-up discussed with the patient who is agreeable with the plan.   Past Medical History:  Diagnosis Date   Diabetes mellitus    Hypercholesteremia    Hypertension    Pancreas cancer [facility])     Past Surgical History:  Procedure Laterality Date   CARDIAC CATHETERIZATION     CHOLECYSTECTOMY N/A 06/26/2012   Procedure: LAPAROSCOPIC CHOLECYSTECTOMY;  Surgeon: Donato Heinz, MD;  Location: [facility] ORS;  Service: General;  Laterality: N/A;   COLONOSCOPY  06/10/2011   Procedure: COLONOSCOPY;  Surgeon: Rogene Houston, MD;  Location: [facility] ENDO SUITE;  Service: Endoscopy;  Laterality: N/A;  1030   ESOPHAGEAL DILATION N/A 07/18/2019   Procedure: ESOPHAGEAL DILATION;  Surgeon: Rogene Houston, MD;  Location: [facility] ENDO SUITE;  Service: Endoscopy;  Laterality: N/A;   ESOPHAGOGASTRODUODENOSCOPY N/A 07/18/2019   Procedure: ESOPHAGOGASTRODUODENOSCOPY (EGD);  Surgeon:  Rogene Houston, MD;  Location: [facility] ENDO SUITE;  Service: Endoscopy;  Laterality: N/A;  215 - moved to 8:30 per Lelon Frohlich, pt is aware    Allergies: Patient has no known allergies.  Medications: Prior to Admission medications   Medication Sig Start Date End Date Taking? Authorizing Provider  docusate sodium (COLACE) 100 MG capsule Take 100 mg by mouth 2 (two) times daily.    [provider]  HYDROcodone-acetaminophen (NORCO) 7.5-325 MG tablet Take 1 tablet by mouth every 6 (six) hours as needed for moderate pain. 07/06/21   Derek Jack, MD  ibuprofen (ADVIL) 200 MG tablet Take 200 mg by mouth every 6 (six) hours as needed.    [provider]  lisinopril (PRINIVIL,ZESTRIL) 40 MG tablet Take 40 mg by mouth daily.     [provider]  metFORMIN (GLUCOPHAGE) 1000 MG tablet Take 1 tablet (1,000 mg total) by mouth 2 (two) times daily. 07/14/21   Derek Jack, MD  metoprolol tartrate (LOPRESSOR) 25 MG tablet Take 12.5 mg by mouth 2 (two) times daily.    [provider]  nitroGLYCERIN (NITROSTAT) 0.4 MG SL tablet Place 0.4 mg under the tongue every 5 (five) minutes as needed for chest pain. Patient not taking: Reported on 07/14/2021    [provider]  pantoprazole (PROTONIX) 40 MG tablet Take 1 tablet (40 mg total) by mouth daily before breakfast. 07/18/19   Rehman, Mechele Dawley, MD  simvastatin (ZOCOR) 20 MG tablet Take 20 mg by mouth daily.     [provider]  sodium chloride 1 g tablet Take 1 tablet (1 g total) by mouth 3 (three) times daily with meals. 07/03/21   Patrecia Pour, MD     Family History  Problem Relation Age of Onset   Colon cancer Mother     Social History   Socioeconomic History   Marital status: Married    Spouse name: Not on file   Number of children: Not on file   Years of education: Not on file   Highest education level: Not on file  Occupational History   Not on file  Tobacco Use   Smoking status: Former     Packs/day: 1.00    Years: 7.00    Pack years: 7.00    Types: Cigarettes   Smokeless tobacco: Former  Scientific laboratory technician Use: Never used  Substance and Sexual Activity   Alcohol use: No   Drug use: No   Sexual activity: Yes    Birth control/protection: None  Other Topics Concern   Not on file  Social History Narrative   Not on file   Social Determinants of Health   Financial Resource Strain: Not on file  Food Insecurity: Not on file  Transportation Needs: Not on file  Physical Activity: Not on file  Stress: Not on file  Social Connections: Not on file     Review of Systems: A 12 point ROS discussed and pertinent positives are indicated in the HPI above.  All other systems are negative.  Review of Systems  Constitutional:  Negative for fever.  HENT:  Negative for congestion.   Respiratory:  Negative for cough and shortness of breath.   Cardiovascular:  Negative for chest pain.  Gastrointestinal:  Negative for abdominal pain.  Neurological:  Negative for headaches.  Psychiatric/Behavioral:  Negative for behavioral problems and confusion.    Vital Signs: BP (!) 146/97    Pulse (!) 111    Temp 98.7 F (37.1 C) (Oral)    Resp 16    Ht 6' (1.829 m)    Wt 142 lb (64.4 kg)    SpO2 98%    BMI 19.26 kg/m   Physical Exam Vitals and nursing note reviewed.  Constitutional:      Appearance: He is well-developed.  HENT:     Head: Normocephalic.  Cardiovascular:     Rate and Rhythm: Regular rhythm. Tachycardia present.     Heart sounds: Normal heart sounds.  Pulmonary:     Effort: Pulmonary effort is normal.     Breath sounds: Normal breath sounds.  Musculoskeletal:        General: Normal range of motion.     Cervical back: Normal range of motion.  Skin:    General: Skin is dry.  Neurological:     Mental Status: He is alert and oriented to person, place, and time.    Imaging: DG Chest 1 View  Result Date: 06/27/2021 CLINICAL DATA:  Status post fall. EXAM: CHEST  1  VIEW COMPARISON:  06/23/2012 FINDINGS: The heart size and mediastinal contours are within normal limits. Aortic atherosclerosis. Both lungs are clear. The visualized skeletal structures are unremarkable. IMPRESSION: No active disease. Electronically Signed   By: Kerby Moors M.D.   On: 06/27/2021 11:41   CT HEAD WO CONTRAST  Result Date: 06/28/2021 CLINICAL DATA:  88 year old male with history of bilateral subdural hematomas. Follow-up study. EXAM: CT HEAD WITHOUT CONTRAST TECHNIQUE: Contiguous axial images were obtained from the base of the skull through the vertex without intravenous contrast. RADIATION DOSE REDUCTION: This exam was performed according to the departmental dose-optimization program which includes automated exposure control, adjustment of the mA and/or kV according to patient size and/or use of  iterative reconstruction technique. COMPARISON:  Head CT 06/27/2021. FINDINGS: Brain: There is again a trace left subdural high attenuation fluid collection adjacent to the sylvian fissure (coronal image 38 of series 5) measuring 2 mm in thickness, stable. Right-sided subdural high attenuation fluid collection adjacent to the right posterior frontal and parietal region (coronal image 29 of series 5), also similar to the prior study measuring up to 7 mm in thickness on today's examination. No new acute intracranial hemorrhage otherwise noted. No intraventricular blood. Moderate cerebral atrophy with ex vacuo dilatation of the ventricular system. Patchy and confluent areas of decreased attenuation are noted throughout the deep and periventricular white matter of the cerebral hemispheres bilaterally, compatible with chronic microvascular ischemic disease. Vascular: Extensive atherosclerosis in the cerebral vasculature. Skull: Normal. Negative for fracture or focal lesion. Sinuses/Orbits: No acute finding. Other: None. IMPRESSION: 1. Stable size and appearance of subdural hematomas bilaterally. No new acute  findings are noted. 2. Moderate cerebral atrophy with ex vacuo dilatation of the ventricular system and extensive chronic microvascular ischemic changes in the cerebral white matter. Electronically Signed   By: Vinnie Langton M.D.   On: 06/28/2021 08:00   CT Head Wo Contrast  Result Date: 06/27/2021 CLINICAL DATA:  Status post fall.  Hit head on bathroom floor. EXAM: CT HEAD WITHOUT CONTRAST CT CERVICAL SPINE WITHOUT CONTRAST TECHNIQUE: Multidetector CT imaging of the head and cervical spine was performed following the standard protocol without intravenous contrast. Multiplanar CT image reconstructions of the cervical spine were also generated. RADIATION DOSE REDUCTION: This exam was performed according to the departmental dose-optimization program which includes automated exposure control, adjustment of the mA and/or kV according to patient size and/or use of iterative reconstruction technique. COMPARISON:  07/14/2020 FINDINGS: CT HEAD FINDINGS Brain: Acute right parietal subdural hematoma is identified, image 33/4. This has a maximum thickness of 6 mm, image 33/4. Mild mass effect upon the underlying cerebral hemisphere with approximately 2 mm of midline shift. Tiny left subdural hematoma is also noted overlying the inferior left parietal lobe measuring 3 mm, image 19/2. No signs of intraventricular hemorrhage. Prominence of the sulci and ventricles compatible with atrophy. There is mild diffuse low-attenuation within the subcortical and periventricular white matter compatible with chronic microvascular disease. Vascular: No hyperdense vessel or unexpected calcification. Skull: Normal. Negative for fracture or focal lesion. Sinuses/Orbits: The paranasal sinuses and mastoid air cells are clear. Other: None. CT CERVICAL SPINE FINDINGS Alignment: Normal. Skull base and vertebrae: No acute fracture. No primary bone lesion or focal pathologic process. Soft tissues and spinal canal: No prevertebral fluid or  swelling. No visible canal hematoma. Disc levels: Multilevel disc space narrowing and endplate spurring noted at C4-5 through C6-7. Upper chest: Negative. Other: None IMPRESSION: 1. Acute right parietal subdural hematoma with maximum thickness of 6 mm. Mild mass effect upon the underlying cerebral hemisphere with approximately 2 mm of midline shift. 2. Tiny left subdural hematoma overlying the inferior left parietal lobe also noted. 3. Chronic small vessel ischemic disease and atrophy. 4. No evidence for cervical spine fracture or subluxation. 5. Cervical degenerative disc disease. Critical Value/emergent results were called by telephone at the time of interpretation on 06/27/2021 at 11:30 am to provider [facility] , who verbally acknowledged these results. Electronically Signed   By: Kerby Moors M.D.   On: 06/27/2021 11:30   CT Cervical Spine Wo Contrast  Result Date: 06/27/2021 CLINICAL DATA:  Status post fall.  Hit head on bathroom floor. EXAM: CT HEAD WITHOUT CONTRAST CT  CERVICAL SPINE WITHOUT CONTRAST TECHNIQUE: Multidetector CT imaging of the head and cervical spine was performed following the standard protocol without intravenous contrast. Multiplanar CT image reconstructions of the cervical spine were also generated. RADIATION DOSE REDUCTION: This exam was performed according to the departmental dose-optimization program which includes automated exposure control, adjustment of the mA and/or kV according to patient size and/or use of iterative reconstruction technique. COMPARISON:  07/14/2020 FINDINGS: CT HEAD FINDINGS Brain: Acute right parietal subdural hematoma is identified, image 33/4. This has a maximum thickness of 6 mm, image 33/4. Mild mass effect upon the underlying cerebral hemisphere with approximately 2 mm of midline shift. Tiny left subdural hematoma is also noted overlying the inferior left parietal lobe measuring 3 mm, image 19/2. No signs of intraventricular hemorrhage.  Prominence of the sulci and ventricles compatible with atrophy. There is mild diffuse low-attenuation within the subcortical and periventricular white matter compatible with chronic microvascular disease. Vascular: No hyperdense vessel or unexpected calcification. Skull: Normal. Negative for fracture or focal lesion. Sinuses/Orbits: The paranasal sinuses and mastoid air cells are clear. Other: None. CT CERVICAL SPINE FINDINGS Alignment: Normal. Skull base and vertebrae: No acute fracture. No primary bone lesion or focal pathologic process. Soft tissues and spinal canal: No prevertebral fluid or swelling. No visible canal hematoma. Disc levels: Multilevel disc space narrowing and endplate spurring noted at C4-5 through C6-7. Upper chest: Negative. Other: None IMPRESSION: 1. Acute right parietal subdural hematoma with maximum thickness of 6 mm. Mild mass effect upon the underlying cerebral hemisphere with approximately 2 mm of midline shift. 2. Tiny left subdural hematoma overlying the inferior left parietal lobe also noted. 3. Chronic small vessel ischemic disease and atrophy. 4. No evidence for cervical spine fracture or subluxation. 5. Cervical degenerative disc disease. Critical Value/emergent results were called by telephone at the time of interpretation on 06/27/2021 at 11:30 am to provider [facility] , who verbally acknowledged these results. Electronically Signed   By: Kerby Moors M.D.   On: 06/27/2021 11:30   CT Abdomen Pelvis W Contrast  Result Date: 06/27/2021 CLINICAL DATA:  Right lower quadrant pain for 5 months EXAM: CT ABDOMEN AND PELVIS WITH CONTRAST TECHNIQUE: Multidetector CT imaging of the abdomen and pelvis was performed using the standard protocol following bolus administration of intravenous contrast. RADIATION DOSE REDUCTION: This exam was performed according to the departmental dose-optimization program which includes automated exposure control, adjustment of the mA and/or kV  according to patient size and/or use of iterative reconstruction technique. CONTRAST:  155m OMNIPAQUE IOHEXOL 300 MG/ML  SOLN COMPARISON:  06/29/2012 06/29/2012 FINDINGS: Lower chest: No acute finding. Mild subpleural reticulation at the bases. Coronary atherosclerosis. Hepatobiliary: No focal liver abnormality.Cholecystectomy. No bile duct dilatation. Pancreas: Ill-defined low-density mass at the body with upstream pancreatic atrophy, mass roughly 3.1 cm in length. The mass infiltrates the peripancreatic fat, especially inferiorly. Associated splenic vein occlusion with collaterals. Spleen: Unremarkable. Adrenals/Urinary Tract: Negative adrenals. No hydronephrosis or stone. Left renal cystic density. Unremarkable bladder. Stomach/Bowel: Negative for bowel obstruction or visible inflammation. Extensive sigmoid diverticulosis. Reticulation fat around the rectal and sigmoid segments is primarily from peritoneal disease. No suspected colitis. No appendicitis. Vascular/Lymphatic: Extensive atheromatous calcification of the aorta and branch vessels. Chronic splenic vein occlusion with collaterals as noted above. No noted adenopathy Reproductive:Unremarkable for age Other: Reticulation of the omentum diffusely with small volume peritoneal fluid in the pelvis where there is peritoneal thickening and enhancement. No measurable nodule. Musculoskeletal: No acute abnormalities. IMPRESSION: 1. Constellation of findings suggest  pancreas adenocarcinoma with peritoneal spread. 2. 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 Electronically Signed   By: Jorje Guild M.D.   On: 06/27/2021 12:44   NM PET Image Initial (PI) Skull Base To Thigh (F-18 FDG)  Result Date: 07/16/2021 CLINICAL DATA:  Initial treatment strategy for pancreatic cancer EXAM: NUCLEAR MEDICINE PET SKULL BASE TO THIGH TECHNIQUE: 7.9 mCi F-18 FDG was injected intravenously. Full-ring PET imaging was performed from  the skull base to thigh after the radiotracer. CT data was obtained and used for attenuation correction and anatomic localization. Fasting blood glucose: 188 mg/dl COMPARISON:  CT abdomen 06/27/2021 FINDINGS: Mediastinal blood pool activity: SUV max 2.1 Liver activity: SUV max NA NECK: No significant abnormal hypermetabolic activity in this region. Incidental CT findings: Ex vacuo ventricular prominence. Previously demonstrated subdural hematomas are not readily visible on today's examination. 5.0 cm hypodense right inferior thyroid nodule on image 83 series 3 extending down into the mediastinum, not substantially hypermetabolic. CHEST: No significant abnormal hypermetabolic activity in this region. Incidental CT findings: Coronary, aortic arch, and branch vessel atherosclerotic vascular disease. ABDOMEN/PELVIS: Mass of the pancreatic body and tail with maximum SUV 3.9. In contrast, the unaffected portion of the pancreas has maximum SUV of 1.5. Nodularity just inferior to the pancreatic body measuring about 2.1 by 1.5 cm may represent local extension of tumor or adjacent adenopathy with maximum SUV 4.7. Indistinct 1 cm nodule below the stomach on image 151 series 3 is mildly hypermetabolic and likely represents a local lymph node, maximum SUV 2.6. Omental caking of tumor noted, representative section in the left abdomen with maximum SUV 4.3. Perihepatic and perisplenic ascites with accentuated metabolic activity, perisplenic ascites with representative maximum SUV 2.0. There is also some pelvic ascites. Incidental CT findings: Cholecystectomy. Vascular prominence along the omentum likely from portal venous hypertension or splenic vein collateralization. Atherosclerosis is present, including aortoiliac atherosclerotic disease. Left mid kidney cyst. Sigmoid colon diverticulosis. SKELETON: No significant abnormal hypermetabolic activity in this region. Incidental CT findings: Suspected free osteochondral fragments in  the right glenohumeral joint. IMPRESSION: 1. Hypermetabolic mass of the pancreatic body and adjacent tail, with adjacent hypermetabolic adenopathy, hypermetabolic omental caking of tumor, and hypermetabolic ascites compatible with peritoneal spread of disease. 2. 5.0 cm hypodense right inferior thyroid nodule, not substantially hypermetabolic. Recommend thyroid US if clinically warranted given patient age. Reference: J Am Coll Radiol. 2015 Feb;12(2): 143-50 3. Other imaging findings of potential clinical significance: Aortic Atherosclerosis (ICD10-I70.0). Coronary atherosclerosis. Systemic atherosclerosis. Splenic vein collateralization. Suspected free osteochondral fragments in the right glenohumeral joint. Electronically Signed   By: Van Clines M.D.   On: 07/16/2021 13:44   DG HIP UNILAT WITH PELVIS 2-3 VIEWS RIGHT  Result Date: 06/27/2021 CLINICAL DATA:  Fall.  Right-sided abdominal pain. EXAM: DG HIP (WITH OR WITHOUT PELVIS) 2-3V RIGHT COMPARISON:  None FINDINGS: There is no evidence of hip fracture or dislocation. Mild right hip osteoarthritis. Soft tissues are unremarkable. IMPRESSION: 1. No acute findings. 2. Mild right hip osteoarthritis. Electronically Signed   By: Kerby Moors M.D.   On: 06/27/2021 11:40    Labs:  CBC: Recent Labs    06/28/21 0228 06/28/21 1020 06/30/21 0355 07/14/21 0906  WBC 4.8 8.6 8.8 7.9  HGB 12.2* 12.4* 11.0* 12.5*  HCT 36.1* 35.9* 32.0* 39.2  PLT 53* 69* 123* 96*    COAGS: No results for input(s): INR, APTT in the last 8760 hours.  BMP: Recent Labs    07/01/21 1522 07/02/21  0310 07/03/21 1039 07/14/21 0906  NA 128* 125* 127* 130*  K 3.5 3.4* 3.4* 4.3  CL 93* 93* 90* 93*  CO2 '26 24 28 26  '$ GLUCOSE 187* 158* 225* 168*  BUN '19 21 19 14  '$ CALCIUM 8.5* 7.9* 8.6* 8.9  CREATININE 0.90 0.78 0.89 0.70  GFRNONAA >60 >60 >60 >60    LIVER FUNCTION TESTS: Recent Labs    05/22/21 0912 06/27/21 1021 07/14/21 0906  BILITOT 0.7 1.1 0.7  AST '19  19 17  '$ ALT '17 20 19  '$ ALKPHOS 59 62 87  PROT 6.8 6.3* 6.4*  ALBUMIN 4.0 3.6 3.4*      Assessment and Plan:  88 y.o. male outpatient. History of  DM, HLD, HTN, metastatic pancreatic cancer now with thrombocytopenia thought to be associated with low grade myelodysplastic syndrome. PET scan from 3.2.23 reads Hypermetabolic mass of the pancreatic body and adjacent tail, with adjacent hypermetabolic adenopathy, hypermetabolic omental caking of tumor, and hypermetabolic ascites compatible with peritoneal spread of disease. Team is requesting biopsy of the peritoneal area for further evaluation and treatment planning. Mental biopsy request approved by IR Attending Dr. Anselm Pancoast for omental biopsy   Labs pending. All medications are within acceptable parameters. NKDA. Patient has been NPO since midnight.  Risks and benefits of omental biopsy was discussed with the patient and/or patient's family including, but not limited to bleeding, infection, damage to adjacent structures or low yield requiring additional tests.  All of the questions were answered and there is agreement to proceed.  Consent signed and in chart.   Thank you for this interesting consult.  I greatly enjoyed meeting Wyatt [name] and look forward to participating in their care.  A copy of this report was sent to the requesting provider on this date.  Electronically Signed: Jacqualine Mau, NP 07/21/2021, 8:37 AM   I spent a total of  30 Minutes   in face to face in clinical consultation, greater than 50% of which was counseling/coordinating care for omental biopsy.

## 2021-07-22 LAB — SURGICAL PATHOLOGY

## 2021-07-23 ENCOUNTER — Other Ambulatory Visit (HOSPITAL_COMMUNITY): Payer: Self-pay | Admitting: *Deleted

## 2021-07-23 ENCOUNTER — Other Ambulatory Visit (HOSPITAL_COMMUNITY): Payer: Medicare HMO

## 2021-07-23 ENCOUNTER — Encounter (HOSPITAL_COMMUNITY): Payer: Self-pay | Admitting: *Deleted

## 2021-07-23 MED ORDER — METFORMIN HCL 1000 MG PO TABS: 500.0000 mg | ORAL_TABLET | Freq: Two times a day (BID) | ORAL | 1 refills | Status: AC

## 2021-07-23 MED ORDER — GLIPIZIDE ER 2.5 MG PO TB24: 2.5000 mg | ORAL_TABLET | Freq: Every day | ORAL | 3 refills | Status: AC

## 2021-07-23 NOTE — Progress Notes (Signed)
Patients family called to notify Dr. Delton Coombes of a fall.  Asymptomatic, patient suffered a skin tear to the left elbow and a knot to the forehead.  Discussed symptoms that require Emergency room visit and told them to follow up with his PCP.  Family expressed understanding.  Family also asking if Lab work needed to be draw prior to next office visit on March 14th.  Message sent to Dr. Delton Coombes in regards to this question. ?

## 2021-07-23 NOTE — Progress Notes (Signed)
Message sent to Dr. Delton Coombes and Renda Rolls RN - Metformin was increased from 500 mg BID to 1000 mg BID during his office visit with you.  The increase caused severe diarrhea and family decreased his dose back to 500 mg BID and the diarrhea subsided.  PCP was contacted and the family was told to consult with you to see if there was something else that you may recommend.  He has means to check blood sugars at home and his most recent one 2 hours after eating was 242.  Family would like to be contacted for further instruction.  ? ?Wyatt Jack, MD  Fayne Norrie, RN ?Please send prescription for Glucotrol XL 2.5 mg once daily with first main meal #30.  He should not take it if he has not eating.  Thanks.  ? ?Medications sent to Pharmacy and patient notified and given instruction. ?

## 2021-07-23 NOTE — Progress Notes (Signed)
Glucotrol XL 2.5 mg daily sent in to Pharmacy and Metformin reduced to 500 mg BID per Dr. Fransico Michael order.  Patient notified of changes. ?

## 2021-07-24 ENCOUNTER — Ambulatory Visit: Payer: Medicare HMO | Admitting: Gastroenterology

## 2021-07-24 ENCOUNTER — Telehealth: Payer: Self-pay | Admitting: Gastroenterology

## 2021-07-24 ENCOUNTER — Other Ambulatory Visit (HOSPITAL_COMMUNITY): Payer: Self-pay | Admitting: *Deleted

## 2021-07-24 DIAGNOSIS — C259 Malignant neoplasm of pancreas, unspecified: Secondary | ICD-10-CM

## 2021-07-24 NOTE — Telephone Encounter (Signed)
Good Morning Dr. Rush Landmark, ? ?Patients granddaughter called to cancel appointment with you at 3:10. She stated that they were able to get the biopsy they needed and did not need the appointment.   ?

## 2021-07-28 ENCOUNTER — Inpatient Hospital Stay (HOSPITAL_COMMUNITY): Payer: Medicare HMO

## 2021-07-28 ENCOUNTER — Other Ambulatory Visit (HOSPITAL_COMMUNITY): Payer: Self-pay | Admitting: *Deleted

## 2021-07-28 ENCOUNTER — Other Ambulatory Visit: Payer: Self-pay

## 2021-07-28 ENCOUNTER — Inpatient Hospital Stay (HOSPITAL_COMMUNITY): Payer: Medicare HMO | Attending: Hematology | Admitting: Hematology

## 2021-07-28 ENCOUNTER — Encounter (HOSPITAL_COMMUNITY): Payer: Self-pay

## 2021-07-28 VITALS — BP 139/79 | HR 82 | Temp 97.1°F | Resp 17 | Ht 72.0 in | Wt 145.5 lb

## 2021-07-28 DIAGNOSIS — I10 Essential (primary) hypertension: Secondary | ICD-10-CM | POA: Insufficient documentation

## 2021-07-28 DIAGNOSIS — Z87891 Personal history of nicotine dependence: Secondary | ICD-10-CM | POA: Insufficient documentation

## 2021-07-28 DIAGNOSIS — C259 Malignant neoplasm of pancreas, unspecified: Secondary | ICD-10-CM

## 2021-07-28 DIAGNOSIS — D696 Thrombocytopenia, unspecified: Secondary | ICD-10-CM | POA: Diagnosis not present

## 2021-07-28 DIAGNOSIS — R180 Malignant ascites: Secondary | ICD-10-CM | POA: Insufficient documentation

## 2021-07-28 DIAGNOSIS — R5383 Other fatigue: Secondary | ICD-10-CM | POA: Diagnosis not present

## 2021-07-28 DIAGNOSIS — R197 Diarrhea, unspecified: Secondary | ICD-10-CM | POA: Diagnosis not present

## 2021-07-28 DIAGNOSIS — Z79899 Other long term (current) drug therapy: Secondary | ICD-10-CM | POA: Diagnosis not present

## 2021-07-28 DIAGNOSIS — E119 Type 2 diabetes mellitus without complications: Secondary | ICD-10-CM | POA: Diagnosis not present

## 2021-07-28 DIAGNOSIS — M549 Dorsalgia, unspecified: Secondary | ICD-10-CM | POA: Diagnosis not present

## 2021-07-28 DIAGNOSIS — D4620 Refractory anemia with excess of blasts, unspecified: Secondary | ICD-10-CM | POA: Insufficient documentation

## 2021-07-28 DIAGNOSIS — C251 Malignant neoplasm of body of pancreas: Secondary | ICD-10-CM | POA: Insufficient documentation

## 2021-07-28 DIAGNOSIS — R109 Unspecified abdominal pain: Secondary | ICD-10-CM | POA: Insufficient documentation

## 2021-07-28 LAB — COMPREHENSIVE METABOLIC PANEL
ALT: 19 U/L (ref 0–44)
AST: 17 U/L (ref 15–41)
Albumin: 3.1 g/dL — ABNORMAL LOW (ref 3.5–5.0)
Alkaline Phosphatase: 80 U/L (ref 38–126)
Anion gap: 11 (ref 5–15)
BUN: 15 mg/dL (ref 8–23)
CO2: 28 mmol/L (ref 22–32)
Calcium: 8.5 mg/dL — ABNORMAL LOW (ref 8.9–10.3)
Chloride: 92 mmol/L — ABNORMAL LOW (ref 98–111)
Creatinine, Ser: 0.68 mg/dL (ref 0.61–1.24)
GFR, Estimated: >60 mL/min (ref >60)
Glucose, Bld: 298 mg/dL — ABNORMAL HIGH (ref 70–99)
Potassium: 3.8 mmol/L (ref 3.5–5.1)
Sodium: 131 mmol/L — ABNORMAL LOW (ref 135–145)
Total Bilirubin: 0.7 mg/dL (ref 0.3–1.2)
Total Protein: 5.9 g/dL — ABNORMAL LOW (ref 6.5–8.1)

## 2021-07-28 LAB — CBC WITH DIFFERENTIAL/PLATELET
Abs Immature Granulocytes: 0.02 10*3/uL (ref 0.00–0.07)
Basophils Absolute: 0.1 10*3/uL (ref 0.0–0.1)
Basophils Relative: 1 %
Eosinophils Absolute: 0.2 10*3/uL (ref 0.0–0.5)
Eosinophils Relative: 2 %
HCT: 39.8 % (ref 39.0–52.0)
Hemoglobin: 12.6 g/dL — ABNORMAL LOW (ref 13.0–17.0)
Immature Granulocytes: 0 %
Lymphocytes Relative: 9 %
Lymphs Abs: 0.6 10*3/uL — ABNORMAL LOW (ref 0.7–4.0)
MCH: 27.7 pg (ref 26.0–34.0)
MCHC: 31.7 g/dL (ref 30.0–36.0)
MCV: 87.5 fL (ref 80.0–100.0)
Monocytes Absolute: 0.7 10*3/uL (ref 0.1–1.0)
Monocytes Relative: 10 %
Neutro Abs: 5.7 10*3/uL (ref 1.7–7.7)
Neutrophils Relative %: 78 %
Platelets: 158 10*3/uL (ref 150–400)
RBC: 4.55 MIL/uL (ref 4.22–5.81)
RDW: 13.2 % (ref 11.5–15.5)
WBC: 7.2 10*3/uL (ref 4.0–10.5)
nRBC: 0 % (ref 0.0–0.2)

## 2021-07-28 LAB — MAGNESIUM: Magnesium: 1.1 mg/dL — ABNORMAL LOW (ref 1.7–2.4)

## 2021-07-28 MED ORDER — SODIUM CHLORIDE 1 G PO TABS: 1.0000 g | ORAL_TABLET | Freq: Three times a day (TID) | ORAL | 3 refills | Status: AC

## 2021-07-28 MED ORDER — SODIUM CHLORIDE 0.9 % IV SOLN: Freq: Once | INTRAVENOUS | Status: AC

## 2021-07-28 MED ORDER — MAGNESIUM SULFATE 2 GM/50ML IV SOLN
2.0000 g | Freq: Once | INTRAVENOUS | Status: AC
Start: 2021-07-28 — End: 2021-07-28
  Administered 2021-07-28: 2 g via INTRAVENOUS
  Filled 2021-07-28: qty 50

## 2021-07-28 MED ORDER — OXYCODONE-ACETAMINOPHEN 10-325 MG PO TABS: 1.0000 | ORAL_TABLET | ORAL | 0 refills | Status: DC | PRN

## 2021-07-28 MED ORDER — MAGNESIUM SULFATE 2 GM/50ML IV SOLN
2.0000 g | Freq: Once | INTRAVENOUS | Status: AC
  Administered 2021-07-28: 2 g via INTRAVENOUS
  Filled 2021-07-28: qty 50

## 2021-07-28 MED ORDER — MAGNESIUM OXIDE -MG SUPPLEMENT 400 (240 MG) MG PO TABS: 400.0000 mg | ORAL_TABLET | Freq: Two times a day (BID) | ORAL | 3 refills | Status: AC

## 2021-07-28 NOTE — Patient Instructions (Signed)
[facility]  Discharge Instructions: ?Thank you for choosing [facility] to provide your oncology and hematology care.  ?If you have a lab appointment with the [facility], please come in thru the Main Entrance and check in at the main information desk. ? ?Wear comfortable clothing and clothing appropriate for easy access to any Portacath or PICC line.  ? ?We strive to give you quality time with your provider. You may need to reschedule your appointment if you arrive late (15 or more minutes).  Arriving late affects you and other patients whose appointments are after yours.  Also, if you miss three or more appointments without notifying the office, you may be dismissed from the clinic at the provider?s discretion.    ?  ?For prescription refill requests, have your pharmacy contact our office and allow 72 hours for refills to be completed.   ? ?Today you received the following chemotherapy and/or immunotherapy agents 4 grams IV Magnesium    ?  ?To help prevent nausea and vomiting after your treatment, we encourage you to take your nausea medication as directed. ? ?BELOW ARE SYMPTOMS THAT SHOULD BE REPORTED IMMEDIATELY: ?*FEVER GREATER THAN 100.4 F (38 ?C) OR HIGHER ?*CHILLS OR SWEATING ?*NAUSEA AND VOMITING THAT IS NOT CONTROLLED WITH YOUR NAUSEA MEDICATION ?*UNUSUAL SHORTNESS OF BREATH ?*UNUSUAL BRUISING OR BLEEDING ?*URINARY PROBLEMS (pain or burning when urinating, or frequent urination) ?*BOWEL PROBLEMS (unusual diarrhea, constipation, pain near the anus) ?TENDERNESS IN MOUTH AND THROAT WITH OR WITHOUT PRESENCE OF ULCERS (sore throat, sores in mouth, or a toothache) ?UNUSUAL RASH, SWELLING OR PAIN  ?UNUSUAL VAGINAL DISCHARGE OR ITCHING  ? ?Items with * indicate a potential emergency and should be followed up as soon as possible or go to the Emergency Department if any problems should occur. ? ?Please show the CHEMOTHERAPY ALERT CARD or IMMUNOTHERAPY ALERT CARD at check-in to the  Emergency Department and triage nurse. ? ?Should you have questions after your visit or need to cancel or reschedule your appointment, please contact [facility] [phone]  and follow the prompts.  Office hours are 8:00 a.m. to 4:30 p.m. Monday - Friday. Please note that voicemails left after 4:00 p.m. may not be returned until the following business day.  We are closed weekends and major holidays. You have access to a nurse at all times for urgent questions. Please call the main number to the clinic [phone] and follow the prompts. ? ?For any non-urgent questions, you may also contact your provider using MyChart. We now offer e-Visits for anyone 54 and older to request care online for non-urgent symptoms. For details visit [email or web address]. ?  ?Also download the MyChart app! Go to the app store, search "MyChart", open the app, select [facility], and log in with your MyChart username and password. ? ?Due to Covid, a mask is required upon entering the hospital/clinic. If you do not have a mask, one will be given to you upon arrival. For doctor visits, patients may have 1 support person aged 36 or older with them. For treatment visits, patients cannot have anyone with them due to current Covid guidelines and our immunocompromised population.  ?

## 2021-07-28 NOTE — Progress Notes (Signed)
Patient presents today for Magnesium infusion due to Magnesium level of 1.1. ? ?Per providers order patient to receive 4 grams of Magnesium IV. ? ?Peripheral IV started and blood return noted pre and post infusion. ? ?4 grams of Magnesium given today per MD orders.  Stable during infusion without adverse affects.  Vital signs stable.  No complaints at this time.  Discharge from clinic via wheelchair in stable condition.  Alert and oriented X 3.  Follow up with [facility] as scheduled.  ?

## 2021-07-28 NOTE — Patient Instructions (Addendum)
Wyatt [name] at [facility] ?Discharge Instructions ? ? ?You were seen and examined today by Dr. Delton Coombes. ? ?He reviewed the results of your lab work and PET scan.  It shows metastatic pancreatic cancer.  Dr. Raliegh Ip discussed treatment options with you.  We will plan to treat you with two chemotherapy drugs called Abraxane and Gemzar.  Each drug takes 30 minutes to infuse. You would come to the clinic every other week to receive treatment. We would do this treatment as long as it helps to control this cancer.  The goal of treatment would be to shrink this cancer to help alleviate the symptoms you are having related to it. The option would be to receive no treatment and receive comfort care with hospice coming to your home. You can discuss treatment options with your family and let us know what you decide.  ? ?We will arrange for you to have a paracentesis (fluid removed from your belly) to help you be more comfortable. This is performed in the radiology by the radiologist. We can schedule this for every 2 weeks to prevent the fluid from building up and making you uncomfortable.  ? ?We will send a prescription for Percocet 10/325 mg tablets that you can take every 4 hours as needed for pain.  Call the clinic if this helps your pain and we will send a 30 day supply.  ? ? ?Thank you for choosing [place] at [facility] to provide your oncology and hematology care.  To afford each patient quality time with our provider, please arrive at least 15 minutes before your scheduled appointment time.  ? ?If you have a lab appointment with the [facility] please come in thru the Main Entrance and check in at the main information desk. ? ?You need to re-schedule your appointment should you arrive 10 or more minutes late.  We strive to give you quality time with our providers, and arriving late affects you and other patients whose appointments are after yours.  Also, if you no show  three or more times for appointments you may be dismissed from the clinic at the providers discretion.     ?Again, thank you for choosing [facility]  Our hope is that these requests will decrease the amount of time that you wait before being seen by our physicians.       ?_____________________________________________________________ ? ?Should you have questions after your visit to [facility], please contact our office at [phone] and follow the prompts.  Our office hours are 8:00 a.m. and 4:30 p.m. Monday - Friday.  Please note that voicemails left after 4:00 p.m. may not be returned until the following business day.  We are closed weekends and major holidays.  You do have access to a nurse 24-7, just call the main number to the clinic [phone] and do not press any options, hold on the line and a nurse will answer the phone.   ? ?For prescription refill requests, have your pharmacy contact our office and allow 72 hours.   ? ?Due to Covid, you will need to wear a mask upon entering the hospital. If you do not have a mask, a mask will be given to you at the Main Entrance upon arrival. For doctor visits, patients may have 1 support person age 83 or older with them. For treatment visits, patients can not have anyone with them due to social distancing guidelines and our  immunocompromised population.  ? ?   ?

## 2021-07-28 NOTE — Progress Notes (Signed)
? ?[facility] ?[address] ?[place], [place] [address] ? ? ?CLINIC:  ?Medical Oncology/Hematology ? ?PCP:  ?Asencion Noble, MD ?[address] / [place] [address] ?[phone] ? ? 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 ?PRIOR THERAPY: none ? ?NGS Results: not done ? ?CURRENT THERAPY: under work-up ? ?BRIEF ONCOLOGIC HISTORY:  ?Oncology History  ? No history exists.  ? ? ?CANCER STAGING: ? Cancer Staging  ?No matching staging information was found for the patient. ? ?INTERVAL HISTORY:  ?[name], a 88 y.o. 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 [place] was last seen on 07/14/2021.  ? ?Today he reports feeling fair, and he is accompanied by his daughter and granddaughter. His granddaughter reports he is increasingly weak, he is not eating well, and his abdomen is distended. He continues to have loose stool and tremors. He reports he eats 2 small meals a day. He feels full easily when eating. His granddaughter reports he has abdominal pain which is helped partially by [facility]. He reports for the past 4 weeks he has had drenching night sweats every night. He had a fall last Thursday when bending over to use a dustpan. He is not currently taking magnesium tablets, but he has taken them previously and denies having diarrhea with them.  ? ?REVIEW OF SYSTEMS:  ?Review of Systems  ?Constitutional:  Positive for appetite change and fatigue.  ?Respiratory:  Positive for shortness of breath.   ?Gastrointestinal:  Positive for abdominal distention, abdominal pain (6/10) and diarrhea (loose stool).  ?Endocrine: Positive for hot flashes (night sweats).  ?Musculoskeletal:  Positive for flank pain (6/10 R).  ?Psychiatric/Behavioral:  Positive for sleep disturbance.   ?All other systems reviewed and are negative. ? ?PAST MEDICAL/SURGICAL HISTORY:  ?Past Medical History:  ?Diagnosis  Date  ? Diabetes mellitus   ? Hypercholesteremia   ? Hypertension   ? [place]   ? ?Past Surgical History:  ?Procedure Laterality Date  ? CARDIAC CATHETERIZATION    ? CHOLECYSTECTOMY N/A 06/26/2012  ? Procedure: LAPAROSCOPIC CHOLECYSTECTOMY;  Surgeon: Donato Heinz, MD;  Location: [facility] ORS;  Service: General;  Laterality: N/A;  ? COLONOSCOPY  06/10/2011  ? Procedure: COLONOSCOPY;  Surgeon: Rogene Houston, MD;  Location: [facility] ENDO SUITE;  Service: Endoscopy;  Laterality: N/A;  1030  ? ESOPHAGEAL DILATION N/A 07/18/2019  ? Procedure: ESOPHAGEAL DILATION;  Surgeon: Rogene Houston, MD;  Location: [facility] ENDO SUITE;  Service: Endoscopy;  Laterality: N/A;  ? ESOPHAGOGASTRODUODENOSCOPY N/A 07/18/2019  ? Procedure: ESOPHAGOGASTRODUODENOSCOPY (EGD);  Surgeon: Rogene Houston, MD;  Location: [facility] ENDO SUITE;  Service: Endoscopy;  Laterality: N/A;  215 - moved to 8:30 per Lelon Frohlich, pt is aware  ? ? ?SOCIAL HISTORY:  ?Social History  ? ?Socioeconomic History  ? Marital status: Married  ?  Spouse name: Not on file  ? Number of children: Not on file  ? Years of education: Not on file  ? Highest education level: Not on file  ?Occupational History  ? Not on file  ?Tobacco Use  ? Smoking status: Former  ?  Packs/day: 1.00  ?  Years: 7.00  ?  Pack years: 7.00  ?  Types: Cigarettes  ? Smokeless tobacco: Former  ?Vaping Use  ? Vaping Use: Never used  ?Substance and Sexual Activity  ? Alcohol use: No  ? Drug use: No  ?  Sexual activity: Yes  ?  Birth control/protection: None  ?Other Topics Concern  ? Not on file  ?Social History Narrative  ? Not on file  ? ?Social Determinants of Health  ? ?Financial Resource Strain: Not on file  ?Food Insecurity: Not on file  ?Transportation Needs: Not on file  ?Physical Activity: Not on file  ?Stress: Not on file  ?Social Connections: Not on file  ?Intimate Partner Violence: Not on file  ? ? ?FAMILY HISTORY:  ?Family History  ?Problem Relation Age of Onset  ? Colon cancer Mother   ? ? ?CURRENT  MEDICATIONS:  ?Current Outpatient Medications  ?Medication Sig Dispense Refill  ? docusate sodium (COLACE) 100 MG capsule Take 100 mg by mouth 2 (two) times daily.    ? glipiZIDE (GLUCOTROL XL) 2.5 MG 24 hr tablet Take 1 tablet (2.5 mg total) by mouth daily with breakfast. Do not take if not eating 30 tablet 3  ? HYDROcodone-acetaminophen (NORCO) 7.5-325 MG tablet Take 1 tablet by mouth every 6 (six) hours as needed for moderate pain. 120 tablet 0  ? ibuprofen (ADVIL) 200 MG tablet Take 200 mg by mouth every 6 (six) hours as needed.    ? lisinopril (PRINIVIL,ZESTRIL) 40 MG tablet Take 40 mg by mouth daily.     ? metFORMIN (GLUCOPHAGE) 1000 MG tablet Take 0.5 tablets (500 mg total) by mouth 2 (two) times daily. 30 tablet 1  ? metoprolol tartrate (LOPRESSOR) 25 MG tablet Take 12.5 mg by mouth 2 (two) times daily.    ? nitroGLYCERIN (NITROSTAT) 0.4 MG SL tablet Place 0.4 mg under the tongue every 5 (five) minutes as needed for chest pain.    ? pantoprazole (PROTONIX) 40 MG tablet Take 1 tablet (40 mg total) by mouth daily before breakfast. 30 tablet 5  ? simvastatin (ZOCOR) 20 MG tablet Take 20 mg by mouth daily.     ? sodium chloride 1 g tablet Take 1 tablet (1 g total) by mouth 3 (three) times daily with meals. 90 tablet 0  ? ?No current facility-administered medications for this visit.  ? ? ?ALLERGIES:  ?No Known Allergies ? ?PHYSICAL EXAM:  ?Performance status (ECOG): 1 - Symptomatic but completely ambulatory ? ?Vitals:  ? 07/28/21 0914  ?BP: 139/79  ?Pulse: 82  ?Resp: 17  ?Temp: (!) 97.1 ?F (36.2 ?C)  ?SpO2: 98%  ? ?Wt Readings from Last 3 Encounters:  ?07/28/21 145 lb 8 oz (66 kg)  ?07/21/21 142 lb (64.4 kg)  ?07/14/21 142 lb 12.8 oz (64.8 kg)  ? ?Physical Exam ?Vitals reviewed.  ?Constitutional:   ?   Appearance: Normal appearance.  ?   Comments: In wheelchair  ?Cardiovascular:  ?   Rate and Rhythm: Normal rate and regular rhythm.  ?   Pulses: Normal pulses.  ?   Heart sounds: Normal heart sounds.  ?Pulmonary:   ?   Effort: Pulmonary effort is normal.  ?   Breath sounds: Normal breath sounds.  ?Abdominal:  ?   Palpations: Abdomen is soft. There is no mass.  ?   Tenderness: There is no abdominal tenderness.  ?Musculoskeletal:  ?   Right lower leg: No edema.  ?   Left lower leg: No edema.  ?Neurological:  ?   General: No focal deficit present.  ?   Mental Status: He is alert and oriented to person, place, and time.  ?Psychiatric:     ?   Mood and Affect: Mood normal.     ?   Behavior:  Behavior normal.  ?  ? ?LABORATORY DATA:  ?I have reviewed the labs as listed.  ?CBC Latest Ref Rng & Units 07/28/2021 07/21/2021 07/14/2021  ?WBC 4.0 - 10.5 K/uL 7.2 7.1 7.9  ?Hemoglobin 13.0 - 17.0 g/dL 12.6(L) 12.8(L) 12.5(L)  ?Hematocrit 39.0 - 52.0 % 39.8 39.0 39.2  ?Platelets 150 - 400 K/uL 158 151 96(L)  ? ?CMP Latest Ref Rng & Units 07/28/2021 07/14/2021 07/03/2021  ?Glucose 70 - 99 mg/dL 298(H) 168(H) 225(H)  ?BUN 8 - 23 mg/dL '15 14 19  '$ ?Creatinine 0.61 - 1.24 mg/dL 0.68 0.70 0.89  ?Sodium 135 - 145 mmol/L 131(L) 130(L) 127(L)  ?Potassium 3.5 - 5.1 mmol/L 3.8 4.3 3.4(L)  ?Chloride 98 - 111 mmol/L 92(L) 93(L) 90(L)  ?CO2 22 - 32 mmol/L '28 26 28  '$ ?Calcium 8.9 - 10.3 mg/dL 8.5(L) 8.9 8.6(L)  ?Total Protein 6.5 - 8.1 g/dL 5.9(L) 6.4(L) -  ?Total Bilirubin 0.3 - 1.2 mg/dL 0.7 0.7 -  ?Alkaline Phos 38 - 126 U/L 80 87 -  ?AST 15 - 41 U/L 17 17 -  ?ALT 0 - 44 U/L 19 19 -  ? ? ?DIAGNOSTIC IMAGING:  ?I have independently reviewed the scans and discussed with the patient. ?NM PET Image Initial (PI) Skull Base To Thigh (F-18 FDG) ? ?Result Date: 07/16/2021 ?CLINICAL DATA:  Initial treatment strategy for pancreatic cancer EXAM: NUCLEAR MEDICINE PET SKULL BASE TO THIGH TECHNIQUE: 7.9 mCi F-18 FDG was injected intravenously. Full-ring PET imaging was performed from the skull base to thigh after the radiotracer. CT data was obtained and used for attenuation correction and anatomic localization. Fasting blood glucose: 188 mg/dl COMPARISON:  CT abdomen  06/27/2021 FINDINGS: Mediastinal blood pool activity: SUV max 2.1 Liver activity: SUV max NA NECK: No significant abnormal hypermetabolic activity in this region. Incidental CT findings: Ex vacuo ventricular prominence.

## 2021-07-29 ENCOUNTER — Encounter (HOSPITAL_COMMUNITY): Payer: Self-pay

## 2021-07-29 ENCOUNTER — Ambulatory Visit (HOSPITAL_COMMUNITY)
Admission: RE | Admit: 2021-07-29 | Discharge: 2021-07-29 | Disposition: A | Discharge status: Home / Self Care | Payer: Medicare HMO | Source: Ambulatory Visit | Attending: Hematology | Admitting: Hematology

## 2021-07-29 DIAGNOSIS — C259 Malignant neoplasm of pancreas, unspecified: Secondary | ICD-10-CM | POA: Diagnosis not present

## 2021-07-29 DIAGNOSIS — R188 Other ascites: Secondary | ICD-10-CM | POA: Diagnosis not present

## 2021-07-29 MED ORDER — ALBUMIN HUMAN 25 % IV SOLN
INTRAVENOUS | Status: AC
  Administered 2021-07-29: 50 g via INTRAVENOUS
  Filled 2021-07-29: qty 200

## 2021-07-29 MED ORDER — ALBUMIN HUMAN 25 % IV SOLN: 50.0000 g | Freq: Once | INTRAVENOUS | Status: AC

## 2021-07-29 MED ORDER — SODIUM CHLORIDE FLUSH 0.9 % IV SOLN
INTRAVENOUS | Status: AC
Start: 2021-07-29 — End: 2021-07-29
  Administered 2021-07-29: 10 mL
  Filled 2021-07-29: qty 10

## 2021-07-29 NOTE — Progress Notes (Signed)
PT tolerated right sided paracentesis procedure and 50G of IV albumin well today and 2 Liters of dark clear yellow fluid removed. PT verbalized understanding of discharge instructions and pushed via wheelchair to front of the hospital to meet daughter with no acute distress noted.  ?

## 2021-07-29 NOTE — Procedures (Signed)
? ?  US guided RLQ paracentesis ? ?2 Liters yellow fluid ?No labs obtained per MD ? ?Tolerated well ? ?EBL: none ?

## 2021-08-03 ENCOUNTER — Encounter (HOSPITAL_COMMUNITY): Payer: Self-pay

## 2021-08-04 ENCOUNTER — Other Ambulatory Visit (HOSPITAL_COMMUNITY): Payer: Self-pay | Admitting: Hematology

## 2021-08-04 ENCOUNTER — Other Ambulatory Visit (HOSPITAL_COMMUNITY): Payer: Self-pay | Admitting: *Deleted

## 2021-08-04 ENCOUNTER — Encounter (HOSPITAL_COMMUNITY): Payer: Self-pay | Admitting: *Deleted

## 2021-08-04 MED ORDER — DRONABINOL 5 MG PO CAPS: 5.0000 mg | ORAL_CAPSULE | Freq: Two times a day (BID) | ORAL | 0 refills | Status: DC

## 2021-08-05 ENCOUNTER — Other Ambulatory Visit (HOSPITAL_COMMUNITY): Payer: Self-pay | Admitting: Hematology

## 2021-08-06 DIAGNOSIS — C259 Malignant neoplasm of pancreas, unspecified: Secondary | ICD-10-CM | POA: Diagnosis not present

## 2021-08-09 ENCOUNTER — Other Ambulatory Visit: Payer: Self-pay

## 2021-08-09 ENCOUNTER — Encounter (HOSPITAL_COMMUNITY): Payer: Self-pay

## 2021-08-09 ENCOUNTER — Emergency Department (HOSPITAL_COMMUNITY): Payer: Medicare HMO

## 2021-08-09 ENCOUNTER — Emergency Department (HOSPITAL_COMMUNITY)
Admission: EM | Admit: 2021-08-09 | Discharge: 2021-08-09 | Disposition: A | Discharge status: Home / Self Care | Payer: Medicare HMO | Attending: Emergency Medicine | Admitting: Emergency Medicine

## 2021-08-09 DIAGNOSIS — W19XXXA Unspecified fall, initial encounter: Secondary | ICD-10-CM

## 2021-08-09 DIAGNOSIS — Z8507 Personal history of malignant neoplasm of pancreas: Secondary | ICD-10-CM | POA: Diagnosis not present

## 2021-08-09 DIAGNOSIS — S59911A Unspecified injury of right forearm, initial encounter: Secondary | ICD-10-CM | POA: Diagnosis present

## 2021-08-09 DIAGNOSIS — R531 Weakness: Secondary | ICD-10-CM | POA: Insufficient documentation

## 2021-08-09 DIAGNOSIS — R000 Tachycardia, unspecified: Secondary | ICD-10-CM | POA: Insufficient documentation

## 2021-08-09 DIAGNOSIS — Y93E1 Activity, personal bathing and showering: Secondary | ICD-10-CM | POA: Diagnosis not present

## 2021-08-09 DIAGNOSIS — R1084 Generalized abdominal pain: Secondary | ICD-10-CM | POA: Insufficient documentation

## 2021-08-09 DIAGNOSIS — S51811A Laceration without foreign body of right forearm, initial encounter: Secondary | ICD-10-CM | POA: Insufficient documentation

## 2021-08-09 DIAGNOSIS — D72829 Elevated white blood cell count, unspecified: Secondary | ICD-10-CM | POA: Insufficient documentation

## 2021-08-09 DIAGNOSIS — R0902 Hypoxemia: Secondary | ICD-10-CM | POA: Diagnosis not present

## 2021-08-09 DIAGNOSIS — R739 Hyperglycemia, unspecified: Secondary | ICD-10-CM | POA: Insufficient documentation

## 2021-08-09 DIAGNOSIS — E1165 Type 2 diabetes mellitus with hyperglycemia: Secondary | ICD-10-CM | POA: Diagnosis not present

## 2021-08-09 DIAGNOSIS — W010XXA Fall on same level from slipping, tripping and stumbling without subsequent striking against object, initial encounter: Secondary | ICD-10-CM | POA: Insufficient documentation

## 2021-08-09 DIAGNOSIS — Z043 Encounter for examination and observation following other accident: Secondary | ICD-10-CM | POA: Diagnosis not present

## 2021-08-09 LAB — BASIC METABOLIC PANEL
Anion gap: 11 (ref 5–15)
BUN: 22 mg/dL (ref 8–23)
CO2: 27 mmol/L (ref 22–32)
Calcium: 9 mg/dL (ref 8.9–10.3)
Chloride: 95 mmol/L — ABNORMAL LOW (ref 98–111)
Creatinine, Ser: 0.75 mg/dL (ref 0.61–1.24)
GFR, Estimated: >60 mL/min (ref >60)
Glucose, Bld: 240 mg/dL — ABNORMAL HIGH (ref 70–99)
Potassium: 4.5 mmol/L (ref 3.5–5.1)
Sodium: 133 mmol/L — ABNORMAL LOW (ref 135–145)

## 2021-08-09 LAB — CBC
HCT: 43 % (ref 39.0–52.0)
Hemoglobin: 13.5 g/dL (ref 13.0–17.0)
MCH: 27.6 pg (ref 26.0–34.0)
MCHC: 31.4 g/dL (ref 30.0–36.0)
MCV: 87.9 fL (ref 80.0–100.0)
Platelets: 209 10*3/uL (ref 150–400)
RBC: 4.89 MIL/uL (ref 4.22–5.81)
RDW: 13.2 % (ref 11.5–15.5)
WBC: 13.1 10*3/uL — ABNORMAL HIGH (ref 4.0–10.5)
nRBC: 0 % (ref 0.0–0.2)

## 2021-08-09 LAB — HEPATIC FUNCTION PANEL
ALT: 14 U/L (ref 0–44)
AST: 18 U/L (ref 15–41)
Albumin: 3.3 g/dL — ABNORMAL LOW (ref 3.5–5.0)
Alkaline Phosphatase: 75 U/L (ref 38–126)
Bilirubin, Direct: 0.1 mg/dL (ref 0.0–0.2)
Indirect Bilirubin: 0.5 mg/dL (ref 0.3–0.9)
Total Bilirubin: 0.6 mg/dL (ref 0.3–1.2)
Total Protein: 6.3 g/dL — ABNORMAL LOW (ref 6.5–8.1)

## 2021-08-09 LAB — LIPASE, BLOOD: Lipase: 20 U/L (ref 11–51)

## 2021-08-09 MED ORDER — SODIUM CHLORIDE 0.9 % IV BOLUS
500.0000 mL | Freq: Once | INTRAVENOUS | Status: AC
  Administered 2021-08-09: 500 mL via INTRAVENOUS

## 2021-08-09 NOTE — ED Notes (Signed)
Patient transported to CT 

## 2021-08-09 NOTE — Discharge Instructions (Addendum)
NewReturn to the ER if you develop fever, vomiting, worsening weakness, new or worsening pain, or any other new/concerning symptoms. ?

## 2021-08-09 NOTE — ED Triage Notes (Signed)
Patient had a fall in the shower not hitting head that he remembers but had large head bleed with shift in feb.  Patient also has had weakness increase over the weekend.  Patient has stage 4 pancreatic cancer and needing a periocentesis ?

## 2021-08-09 NOTE — ED Notes (Signed)
Pt informed of need for urine sample.

## 2021-08-09 NOTE — ED Provider Notes (Signed)
?[facility] ?Provider Note ? ? ?CSN: [id number] ?Arrival date & time: 08/09/21  1756 ? ?  ? ?History ? ?Chief Complaint  ?Patient presents with  ? Fall  ? Weakness  ? ? ?Wyatt [name] is a 88 y.o. male. ? ?HPI ?88 year old male presents with slip and fall in the shower.  He did not lose consciousness and does not think he hit his head but he is concerned because he recently had a subdural hematoma and was admitted.  He has no headache.  He has chronic neck pain that might be a little worse since the fall.  He denies chest pain.  He has chronic abdominal pain and has pancreatic adenocarcinoma.  He has ascites and had a paracentesis just under 2 weeks ago but feels like he needs 1 again now.  However no new abdominal pain.  He has been feeling generally weak since getting out of the hospital. ? ?Home Medications ?Prior to Admission medications   ?Medication Sig Start Date End Date Taking? Authorizing Provider  ?docusate sodium (COLACE) 100 MG capsule Take 100 mg by mouth 2 (two) times daily.   Yes [provider]  ?dronabinol (MARINOL) 5 MG capsule Take 1 capsule (5 mg total) by mouth 2 (two) times daily before a meal. 08/04/21  Yes Derek Jack, MD  ?glipiZIDE (GLUCOTROL XL) 2.5 MG 24 hr tablet Take 1 tablet (2.5 mg total) by mouth daily with breakfast. Do not take if not eating 07/23/21 08/22/21 Yes Derek Jack, MD  ?HYDROcodone-acetaminophen ([place]) 7.5-325 MG tablet Take 1 tablet by mouth every 6 (six) hours as needed for moderate pain. 07/06/21  Yes Derek Jack, MD  ?ibuprofen (ADVIL) 200 MG tablet Take 200 mg by mouth every 6 (six) hours as needed.   Yes [provider]  ?lisinopril (PRINIVIL,ZESTRIL) 40 MG tablet Take 40 mg by mouth daily.    Yes [provider]  ?magnesium oxide (MAG-OX) 400 (240 Mg) MG tablet Take 1 tablet (400 mg total) by mouth 2 (two) times daily. 07/28/21  Yes Derek Jack, MD  ?metFORMIN (GLUCOPHAGE) 1000 MG  tablet Take 0.5 tablets (500 mg total) by mouth 2 (two) times daily. 07/23/21  Yes Derek Jack, MD  ?metoprolol tartrate (LOPRESSOR) 25 MG tablet Take 12.5 mg by mouth 2 (two) times daily.   Yes [provider]  ?nitroGLYCERIN (NITROSTAT) 0.4 MG SL tablet Place 0.4 mg under the tongue every 5 (five) minutes as needed for chest pain.   Yes [provider]  ?oxyCODONE-acetaminophen (PERCOCET) 10-325 MG tablet TAKE ONE TABLET BY MOUTH EVERY FOUR HOURS AS NEEDED FOR PAIN ?Patient taking differently: Take 1 tablet by mouth every 4 (four) hours as needed for pain. 08/05/21  Yes Derek Jack, MD  ?pantoprazole (PROTONIX) 40 MG tablet Take 1 tablet (40 mg total) by mouth daily before breakfast. 07/18/19  Yes Rehman, Mechele Dawley, MD  ?simvastatin (ZOCOR) 20 MG tablet Take 20 mg by mouth daily.    Yes [provider]  ?sodium chloride 1 g tablet Take 1 tablet (1 g total) by mouth 3 (three) times daily with meals. 07/28/21  Yes Derek Jack, MD  ?   ? ?Allergies    ?Patient has no known allergies.   ? ?Review of Systems   ?Review of Systems  ?Constitutional:  Negative for fever.  ?Respiratory:  Negative for shortness of breath.   ?Cardiovascular:  Negative for chest pain.  ?Gastrointestinal:  Positive for abdominal distention and abdominal pain. Negative for vomiting.  ?  Musculoskeletal:  Positive for neck pain.  ?Neurological:  Positive for weakness. Negative for headaches.  ? ?Physical Exam ?Updated Vital Signs ?BP 136/80   Pulse (!) 102   Temp 98.5 ?F (36.9 ?C) (Oral)   Resp (!) 22   Ht 6' (1.829 m)   Wt 65.8 kg   SpO2 91%   BMI 19.67 kg/m?  ?Physical Exam ?Vitals and nursing note reviewed.  ?Constitutional:   ?   Appearance: He is well-developed.  ?HENT:  ?   Head: Normocephalic and atraumatic.  ?Eyes:  ?   Extraocular Movements: Extraocular movements intact.  ?   Pupils: Pupils are equal, round, and reactive to light.  ?   Comments: No obvious scalp trauma  ?Neck:  ?    Comments: No posterior neck tenderness. ?Cardiovascular:  ?   Rate and Rhythm: Regular rhythm. Tachycardia present.  ?   Heart sounds: Normal heart sounds.  ?   Comments: HR low 100s ?Pulmonary:  ?   Effort: Pulmonary effort is normal.  ?   Breath sounds: Normal breath sounds.  ?Abdominal:  ?   General: There is distension.  ?   Palpations: Abdomen is soft.  ?   Tenderness: There is abdominal tenderness (mild generalized).  ?Musculoskeletal:  ?   Comments: Small skin tear to the right forearm.  No tenderness, swelling.  ?Skin: ?   General: Skin is warm and dry.  ?Neurological:  ?   Mental Status: He is alert.  ?   Comments: Equal strength in all 4 extremities. Able to sit up by himself.  ? ? ?ED Results / Procedures / Treatments   ?Labs ?(all labs ordered are listed, but only abnormal results are displayed) ?Labs Reviewed  ?BASIC METABOLIC PANEL - Abnormal; Notable for the following components:  ?    Result Value  ? Sodium 133 (*)   ? Chloride 95 (*)   ? Glucose, Bld 240 (*)   ? All other components within normal limits  ?CBC - Abnormal; Notable for the following components:  ? WBC 13.1 (*)   ? All other components within normal limits  ?HEPATIC FUNCTION PANEL - Abnormal; Notable for the following components:  ? Total Protein 6.3 (*)   ? Albumin 3.3 (*)   ? All other components within normal limits  ?LIPASE, BLOOD  ?URINALYSIS, ROUTINE W REFLEX MICROSCOPIC  ? ? ?EKG ?None ? ?Radiology ?CT HEAD WO CONTRAST ? ?Result Date: 08/09/2021 ?CLINICAL DATA:  Fall, recent intracranial hemorrhage, metastatic pancreatic cancer EXAM: CT HEAD WITHOUT CONTRAST CT CERVICAL SPINE WITHOUT CONTRAST TECHNIQUE: Multidetector CT imaging of the head and cervical spine was performed following the standard protocol without intravenous contrast. Multiplanar CT image reconstructions of the cervical spine were also generated. RADIATION DOSE REDUCTION: This exam was performed according to the departmental dose-optimization program which includes  automated exposure control, adjustment of the mA and/or kV according to patient size and/or use of iterative reconstruction technique. COMPARISON:  06/28/2021 FINDINGS: CT HEAD FINDINGS Brain: No evidence of acute infarction, hemorrhage, hydrocephalus, extra-axial collection or mass lesion/mass effect. Mild global cerebral volume loss. Periventricular white matter hypodensity. Previously seen small bilateral subdural hematomas have resolved. Vascular: No hyperdense vessel or unexpected calcification. Skull: Normal. Negative for fracture or focal lesion. Sinuses/Orbits: No acute finding. Other: None. CT CERVICAL SPINE FINDINGS Alignment: Normal. Skull base and vertebrae: No acute fracture. No primary bone lesion or focal pathologic process. Soft tissues and spinal canal: No prevertebral fluid or swelling. No visible canal hematoma. Disc levels:  Moderate multilevel disc space height loss and osteophytosis, worst from C4 through C6. Upper chest: Negative. Other: Large, partially imaged exophytic nodule of the inferior pole of the right lobe of the thyroid, not previously PET avid. In the setting of significant comorbidities or limited life expectancy, no follow-up recommended (ref: J Am Coll Radiol. 2015 Feb;12(2): 143-50). IMPRESSION: 1. No acute intracranial pathology. Previously seen small bilateral subdural hematomas have resolved. 2. No fracture or static subluxation of the cervical spine. Moderate multilevel cervical disc degenerative disease. Electronically Signed   By: Delanna Ahmadi M.D.   On: 08/09/2021 19:07  ? ?CT Cervical Spine Wo Contrast ? ?Result Date: 08/09/2021 ?CLINICAL DATA:  Fall, recent intracranial hemorrhage, metastatic pancreatic cancer EXAM: CT HEAD WITHOUT CONTRAST CT CERVICAL SPINE WITHOUT CONTRAST TECHNIQUE: Multidetector CT imaging of the head and cervical spine was performed following the standard protocol without intravenous contrast. Multiplanar CT image reconstructions of the cervical  spine were also generated. RADIATION DOSE REDUCTION: This exam was performed according to the departmental dose-optimization program which includes automated exposure control, adjustment of the mA and/or kV according

## 2021-08-10 ENCOUNTER — Inpatient Hospital Stay: Payer: Medicare HMO | Admitting: Licensed Clinical Social Worker

## 2021-08-10 ENCOUNTER — Telehealth (HOSPITAL_COMMUNITY): Payer: Self-pay | Admitting: *Deleted

## 2021-08-10 NOTE — Telephone Encounter (Signed)
Received tc from granddaughter Lorriane Shire advising that patient is very weak, lost more weight and is not taking much in p.o.  Was taken to the ER last night for another fall.  They currently have palliative care set up for April 6th.  Patient is not at the point where he is willing to transition to hospice.  He has voiced to his family that he would like to revisit at least one palliative treatment to see how he responds physically to it.  Appointment made for Thursday for them to come and discuss options with Dr Delton Coombes.  They will make the decision, at that time as to proceed with treatment or enroll in hospice care. ?

## 2021-08-10 NOTE — Progress Notes (Deleted)
REFERRING PROVIDER: ?Derek Jack, MD ?[address] ?[place],   [address] ? ?PRIMARY PROVIDER:  ?Asencion Noble, MD ? ?PRIMARY REASON FOR VISIT:  ?1. Pancreatic adenocarcinoma ([place])   ?2. Family history of colon cancer   ? ? ? ?HISTORY OF PRESENT ILLNESS:   ?Wyatt [name], a 88 y.o. male, was seen for a [facility] cancer genetics consultation at the request of Dr. Delton Coombes due to a personal and family history of cancer.  Wyatt [name] presents to clinic today to discuss the possibility of a hereditary predisposition to cancer, genetic testing, and to further clarify his future cancer risks, as well as potential cancer risks for family members.  ? ?In 2023, at the age of 11, Wyatt [name] was diagnosed with pancreatic adenocarcioma. The treatment plan includes palliatve chemotherapy vs hospice***.  ? ?CANCER HISTORY:  ?Oncology History  ? No history exists.  ? ? ? ?Past Medical History:  ?Diagnosis Date  ? Diabetes mellitus   ? Hypercholesteremia   ? Hypertension   ? Pancreas cancer ([place])   ? ? ?Past Surgical History:  ?Procedure Laterality Date  ? CARDIAC CATHETERIZATION    ? CHOLECYSTECTOMY N/A 06/26/2012  ? Procedure: LAPAROSCOPIC CHOLECYSTECTOMY;  Surgeon: Donato Heinz, MD;  Location: [facility];  Service: General;  Laterality: N/A;  ? COLONOSCOPY  06/10/2011  ? Procedure: COLONOSCOPY;  Surgeon: Rogene Houston, MD;  Location: [facility];  Service: Endoscopy;  Laterality: N/A;  1030  ? ESOPHAGEAL DILATION N/A 07/18/2019  ? Procedure: ESOPHAGEAL DILATION;  Surgeon: Rogene Houston, MD;  Location: [facility];  Service: Endoscopy;  Laterality: N/A;  ? ESOPHAGOGASTRODUODENOSCOPY N/A 07/18/2019  ? Procedure: ESOPHAGOGASTRODUODENOSCOPY (EGD);  Surgeon: Rogene Houston, MD;  Location: [facility];  Service: Endoscopy;  Laterality: N/A;  215 - moved to 8:30 per Lelon Frohlich, pt is aware  ? ? ?Social History  ? ?Socioeconomic History  ? Marital status: Married  ?  Spouse name: Not on file  ? Number of children: Not on file  ?  Years of education: Not on file  ? Highest education level: Not on file  ?Occupational History  ? Not on file  ?Tobacco Use  ? Smoking status: Former  ?  Packs/day: 1.00  ?  Years: 7.00  ?  Pack years: 7.00  ?  Types: Cigarettes  ? Smokeless tobacco: Former  ?Vaping Use  ? Vaping Use: Never used  ?Substance and Sexual Activity  ? Alcohol use: No  ? Drug use: No  ? Sexual activity: Yes  ?  Birth control/protection: None  ?Other Topics Concern  ? Not on file  ?Social History Narrative  ? Not on file  ? ?Social Determinants of Health  ? ?Financial Resource Strain: Not on file  ?Food Insecurity: Not on file  ?Transportation Needs: Not on file  ?Physical Activity: Not on file  ?Stress: Not on file  ?Social Connections: Not on file  ?  ? ?FAMILY HISTORY:  ?We obtained a detailed, 4-generation family history.  Significant diagnoses are listed below: ?Family History  ?Problem Relation Age of Onset  ? Colon cancer Mother   ? ? ?Wyatt [name] is {aware/unaware} of previous family history of genetic testing for hereditary cancer risks. Patient's maternal ancestors are of *** descent, and paternal ancestors are of *** descent. There {IS NO:12509} reported Ashkenazi Jewish ancestry. There {IS NO:12509} known consanguinity. ? ?GENETIC COUNSELING ASSESSMENT: Wyatt [name] is a 88 y.o. male with a personal history of pancreatic cancer which is somewhat  suggestive of a hereditary cancer syndrome and predisposition to cancer. We, therefore, discussed and recommended the following at today's visit.  ? ?DISCUSSION: We discussed that approximately 10% of pancreatic cancer is hereditary. Most cases of hereditary pancreatic cancer are associated with BRCA1/BRCA2 genes, although there are other genes associated with hereditary cancer as well. Cancers and risks are gene specific.  We discussed that testing is beneficial for several reasons including knowing if an individual is a candidate for certain targeted therapies, knowing about other cancer  risks, identifying potential screening and risk-reduction options that may be appropriate, and to understand if other family members could be at risk for cancer and allow them to undergo genetic testing.  ? ?We reviewed the characteristics, features and inheritance patterns of hereditary cancer syndromes. We also discussed genetic testing, including the appropriate family members to test, the process of testing, insurance coverage and turn-around-time for results. We discussed the implications of a negative, positive and/or variant of uncertain significant result. We recommended Wyatt [name] pursue genetic testing for the [facility] Multi-Cancer+RNA gene panel. His blood was previously drawn for this.  ? ?GENETIC TEST RESULTS: Genetic testing reported out on 08/01/2021 through the [facility] Multi-Cancer+RNA cancer panel found no pathogenic mutations.  ? ?The Multi-Cancer Panel + RNA offered by [facility] includes sequencing and/or deletion duplication testing of the following 84 genes: AIP, ALK, APC, ATM, AXIN2,BAP1,  BARD1, BLM, BMPR1A, BRCA1, BRCA2, BRIP1, CASR, CDC73, CDH1, CDK4, CDKN1B, CDKN1C, CDKN2A (p14ARF), CDKN2A (p16INK4a), CEBPA, CHEK2, CTNNA1, DICER1, DIS3L2, EGFR (c.2369C>T, p.Thr790Met variant only), EPCAM (Deletion/duplication testing only), FH, FLCN, GATA2, GPC3, GREM1 (Promoter region deletion/duplication testing only), HOXB13 (c.251G>A, p.Gly84Glu), HRAS, KIT, MAX, MEN1, MET, MITF (c.952G>A, p.Glu318Lys variant only), MLH1, MSH2, MSH3, MSH6, MUTYH, NBN, NF1, NF2, NTHL1, PALB2, PDGFRA, PHOX2B, PMS2, POLD1, POLE, POT1, PRKAR1A, PTCH1, PTEN, RAD50, RAD51C, RAD51D, RB1, RECQL4, RET, RUNX1, SDHAF2, SDHA (sequence changes only), SDHB, SDHC, SDHD, SMAD4, SMARCA4, SMARCB1, SMARCE1, STK11, SUFU, TERC, TERT, TMEM127, TP53, TSC1, TSC2, VHL, WRN and WT1.  ? ?The test report has been scanned into EPIC and is located under the Molecular Pathology section of the Results Review tab.  A portion of the result report is  included below for reference.  ? ? ? ?We discussed that because current genetic testing is not perfect, it is possible there may be a gene mutation in one of these genes that current testing cannot detect, but that chance is small.  There could be another gene that has not yet been discovered, or that we have not yet tested, that is responsible for the cancer diagnoses in the family. It is also possible there is a hereditary cause for the cancer in the family that Wyatt [name] did not inherit and therefore was not identified in his testing.  Therefore, it is important to remain in touch with cancer genetics in the future so that we can continue to offer Wyatt [name] the most up to date genetic testing.  ? ?ADDITIONAL GENETIC TESTING: We discussed with Wyatt [name] that his genetic testing was fairly extensive.  If there are genes identified to increase cancer risk that can be analyzed in the future, we would be happy to discuss and coordinate this testing at that time.   ? ?CANCER SCREENING RECOMMENDATIONS: Wyatt [name] test result is considered negative (normal).  This means that we have not identified a hereditary cause for his  personal and family history of cancer at this time. Most cancers happen by chance and this negative test suggests that his cancer  may fall into this category.   ? ?While reassuring, this does not definitively rule out a hereditary predisposition to cancer. It is still possible that there could be genetic mutations that are undetectable by current technology. There could be genetic mutations in genes that have not been tested or identified to increase cancer risk.  Therefore, it is recommended he continue to follow the cancer management and screening guidelines provided by his oncology and primary healthcare provider.  ? ?An individual's cancer risk and medical management are not determined by genetic test results alone. Overall cancer risk assessment incorporates additional factors, including  personal medical history, family history, and any available genetic information that may result in a personalized plan for cancer prevention and surveillance. ? ?RECOMMENDATIONS FOR FAMILY MEMBERS:  Relatives

## 2021-08-11 ENCOUNTER — Inpatient Hospital Stay: Payer: Medicare HMO | Admitting: Licensed Clinical Social Worker

## 2021-08-12 ENCOUNTER — Ambulatory Visit (HOSPITAL_COMMUNITY): Admission: RE | Admit: 2021-08-12 | Payer: Medicare HMO | Source: Ambulatory Visit

## 2021-08-13 ENCOUNTER — Encounter (HOSPITAL_COMMUNITY): Payer: Self-pay

## 2021-08-13 ENCOUNTER — Inpatient Hospital Stay (HOSPITAL_BASED_OUTPATIENT_CLINIC_OR_DEPARTMENT_OTHER): Payer: Medicare HMO | Admitting: Hematology

## 2021-08-13 ENCOUNTER — Ambulatory Visit (HOSPITAL_COMMUNITY)
Admission: RE | Admit: 2021-08-13 | Discharge: 2021-08-13 | Disposition: A | Discharge status: Home / Self Care | Payer: Medicare HMO | Source: Ambulatory Visit | Attending: Hematology | Admitting: Hematology

## 2021-08-13 ENCOUNTER — Other Ambulatory Visit (HOSPITAL_COMMUNITY): Payer: Self-pay

## 2021-08-13 VITALS — BP 131/68 | HR 88 | Temp 97.7°F | Resp 16 | Ht 72.0 in | Wt 133.4 lb

## 2021-08-13 DIAGNOSIS — R5383 Other fatigue: Secondary | ICD-10-CM | POA: Diagnosis not present

## 2021-08-13 DIAGNOSIS — D696 Thrombocytopenia, unspecified: Secondary | ICD-10-CM | POA: Diagnosis not present

## 2021-08-13 DIAGNOSIS — C259 Malignant neoplasm of pancreas, unspecified: Secondary | ICD-10-CM | POA: Diagnosis not present

## 2021-08-13 DIAGNOSIS — R180 Malignant ascites: Secondary | ICD-10-CM | POA: Diagnosis not present

## 2021-08-13 DIAGNOSIS — E119 Type 2 diabetes mellitus without complications: Secondary | ICD-10-CM | POA: Diagnosis not present

## 2021-08-13 DIAGNOSIS — D4620 Refractory anemia with excess of blasts, unspecified: Secondary | ICD-10-CM | POA: Diagnosis not present

## 2021-08-13 DIAGNOSIS — M549 Dorsalgia, unspecified: Secondary | ICD-10-CM | POA: Diagnosis not present

## 2021-08-13 DIAGNOSIS — R109 Unspecified abdominal pain: Secondary | ICD-10-CM | POA: Diagnosis not present

## 2021-08-13 DIAGNOSIS — R188 Other ascites: Secondary | ICD-10-CM | POA: Diagnosis not present

## 2021-08-13 DIAGNOSIS — C251 Malignant neoplasm of body of pancreas: Secondary | ICD-10-CM | POA: Diagnosis not present

## 2021-08-13 DIAGNOSIS — I10 Essential (primary) hypertension: Secondary | ICD-10-CM | POA: Diagnosis not present

## 2021-08-13 MED ORDER — ALBUMIN HUMAN 25 % IV SOLN
INTRAVENOUS | Status: AC
  Administered 2021-08-13: 50 g via INTRAVENOUS
  Filled 2021-08-13: qty 200

## 2021-08-13 MED ORDER — HYDROMORPHONE HCL 2 MG PO TABS: 1.0000 mg | ORAL_TABLET | ORAL | 0 refills | Status: AC | PRN

## 2021-08-13 MED ORDER — ALBUMIN HUMAN 25 % IV SOLN: 50.0000 g | Freq: Once | INTRAVENOUS | Status: AC

## 2021-08-13 MED ORDER — PROCHLORPERAZINE MALEATE 10 MG PO TABS
10.0000 mg | ORAL_TABLET | Freq: Four times a day (QID) | ORAL | 0 refills | Status: AC | PRN
Start: 2021-08-13 — End: ?

## 2021-08-13 MED ORDER — SODIUM CHLORIDE FLUSH 0.9 % IV SOLN
INTRAVENOUS | Status: AC
  Administered 2021-08-13: 10 mL
  Filled 2021-08-13: qty 10

## 2021-08-13 NOTE — Patient Instructions (Addendum)
[facility] at [facility] ?Discharge Instructions ? ?You were seen and examined today by Dr. Delton Coombes.  ? ?Dr. Delton Coombes discussed palliative chemotherapy which would likely increase your fatigue and weakness. It can cause increased nausea and increased lower extremity fluid accumulation. ? ?Given your current physical state, you could very likely feel worse with treatment. ? ?Dr. Delton Coombes has recommended that you meet with [facility]. Their goal will be to keep you comfortable for the remainder of the time you have left. ? ? ?Thank you for choosing [facility] at [facility] to provide your oncology and hematology care.  To afford each patient quality time with our provider, please arrive at least 15 minutes before your scheduled appointment time.  ? ?If you have a lab appointment with the [facility] please come in thru the Main Entrance and check in at the main information desk. ? ?You need to re-schedule your appointment should you arrive 10 or more minutes late.  We strive to give you quality time with our providers, and arriving late affects you and other patients whose appointments are after yours.  Also, if you no show three or more times for appointments you may be dismissed from the clinic at the providers discretion.     ?Again, thank you for choosing [facility].  Our hope is that these requests will decrease the amount of time that you wait before being seen by our physicians.       ?_____________________________________________________________ ? ?Should you have questions after your visit to [facility], please contact our office at [phone] and follow the prompts.  Our office hours are 8:00 a.m. and 4:30 p.m. Monday - Friday.  Please note that voicemails left after 4:00 p.m. may not be returned until the following business day.  We are closed weekends and major holidays.  You do have access to a nurse  24-7, just call the main number to the clinic [phone] and do not press any options, hold on the line and a nurse will answer the phone.   ? ?For prescription refill requests, have your pharmacy contact our office and allow 72 hours.   ? ?Due to Covid, you will need to wear a mask upon entering the hospital. If you do not have a mask, a mask will be given to you at the Main Entrance upon arrival. For doctor visits, patients may have 1 support person age 80 or older with them. For treatment visits, patients can not have anyone with them due to social distancing guidelines and our immunocompromised population.  ? ? ? ?

## 2021-08-13 NOTE — Progress Notes (Signed)
Patient tolerated left sided paracentesis and 50G of IV albumin well today and 3.3 Liters of clear yellow fluid removed. Pt verbalized understanding of discharge instructions and left via wheelchair with family from waiting area a this time with no acute distress noted.  ?

## 2021-08-13 NOTE — Procedures (Signed)
? ?  US guided LLQ paracentesis ? ?3.3 liters yellow fluid obtained ?No labs per MD ? ?Tolerated well ? ?EBL: none ?

## 2021-08-13 NOTE — Progress Notes (Signed)
? ?[facility] ?[address] ?[place], [place] [address] ? ? ?CLINIC:  ?Medical Oncology/Hematology ? ?PCP:  ?Asencion Noble, MD ?[address] / [place] [address] ?[phone] ? ? ?REASON FOR VISIT:  ?Follow-up for  metastatic pancreatic cancer  ? ?PRIOR THERAPY: none ? ?NGS Results: not done ? ?CURRENT THERAPY: under work-up ? ?BRIEF ONCOLOGIC HISTORY:  ?Oncology History  ? No history exists.  ? ? ?CANCER STAGING: ? Cancer Staging  ?No matching staging information was found for the patient. ? ?INTERVAL HISTORY:  ?[name], a 88 y.o. 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 Wyatt [name] was last seen on 07/28/2021.  ? ?Today he reports feeling fair, and he is accompanied by his granddaughter. He has lost 12 lbs since his last visit. His appetite is poor. His abdominal and back pain are disrupting his sleep, and this pain has not been helped by percocet. He has had 3 falls in the past 2 months; his last fall was on 03/30, and he fell in the shower. His granddaughter reports his weakness has worsened, and he reports fatigue. His granddaughter reports he could not tolerate Marinol. His granddaughter reports nausea and denies vomiting. He reports mild diarrhea.  ? ?REVIEW OF SYSTEMS:  ?Review of Systems  ?Constitutional:  Positive for appetite change, fatigue and unexpected weight change (-12 lbs).  ?Respiratory:  Positive for shortness of breath.   ?Gastrointestinal:  Positive for abdominal pain (7/10), diarrhea and nausea. Negative for vomiting.  ?Musculoskeletal:  Positive for back pain (7/10).  ?Neurological:  Positive for dizziness.  ?Psychiatric/Behavioral:  Positive for confusion.   ?All other systems reviewed and are negative. ? ?PAST MEDICAL/SURGICAL HISTORY:  ?Past Medical History:  ?Diagnosis Date  ? Diabetes mellitus   ? Hypercholesteremia   ? Hypertension   ? Pancreas cancer ([place])   ? ?Past Surgical History:   ?Procedure Laterality Date  ? CARDIAC CATHETERIZATION    ? CHOLECYSTECTOMY N/A 06/26/2012  ? Procedure: LAPAROSCOPIC CHOLECYSTECTOMY;  Surgeon: Donato Heinz, MD;  Location: [facility];  Service: General;  Laterality: N/A;  ? COLONOSCOPY  06/10/2011  ? Procedure: COLONOSCOPY;  Surgeon: Rogene Houston, MD;  Location: [facility];  Service: Endoscopy;  Laterality: N/A;  1030  ? ESOPHAGEAL DILATION N/A 07/18/2019  ? Procedure: ESOPHAGEAL DILATION;  Surgeon: Rogene Houston, MD;  Location: [facility];  Service: Endoscopy;  Laterality: N/A;  ? ESOPHAGOGASTRODUODENOSCOPY N/A 07/18/2019  ? Procedure: ESOPHAGOGASTRODUODENOSCOPY (EGD);  Surgeon: Rogene Houston, MD;  Location: [facility];  Service: Endoscopy;  Laterality: N/A;  215 - moved to 8:30 per Lelon Frohlich, pt is aware  ? ? ?SOCIAL HISTORY:  ?Social History  ? ?Socioeconomic History  ? Marital status: Married  ?  Spouse name: Not on file  ? Number of children: Not on file  ? Years of education: Not on file  ? Highest education level: Not on file  ?Occupational History  ? Not on file  ?Tobacco Use  ? Smoking status: Former  ?  Packs/day: 1.00  ?  Years: 7.00  ?  Pack years: 7.00  ?  Types: Cigarettes  ? Smokeless tobacco: Former  ?Vaping Use  ? Vaping Use: Never used  ?Substance and Sexual Activity  ? Alcohol use: No  ? Drug use: No  ? Sexual activity: Yes  ?  Birth control/protection: None  ?Other Topics Concern  ? Not on file  ?Social History Narrative  ?  Not on file  ? ?Social Determinants of Health  ? ?Financial Resource Strain: Not on file  ?Food Insecurity: Not on file  ?Transportation Needs: Not on file  ?Physical Activity: Not on file  ?Stress: Not on file  ?Social Connections: Not on file  ?Intimate Partner Violence: Not on file  ? ? ?FAMILY HISTORY:  ?Family History  ?Problem Relation Age of Onset  ? Colon cancer Mother   ? ? ?CURRENT MEDICATIONS:  ?Current Outpatient Medications  ?Medication Sig Dispense Refill  ? docusate sodium (COLACE) 100 MG capsule Take 100  mg by mouth 2 (two) times daily.    ? glipiZIDE (GLUCOTROL XL) 2.5 MG 24 hr tablet Take 1 tablet (2.5 mg total) by mouth daily with breakfast. Do not take if not eating 30 tablet 3  ? ibuprofen (ADVIL) 200 MG tablet Take 200 mg by mouth every 6 (six) hours as needed.    ? magnesium oxide (MAG-OX) 400 (240 Mg) MG tablet Take 1 tablet (400 mg total) by mouth 2 (two) times daily. 60 tablet 3  ? metFORMIN (GLUCOPHAGE) 1000 MG tablet Take 0.5 tablets (500 mg total) by mouth 2 (two) times daily. 30 tablet 1  ? metoprolol tartrate (LOPRESSOR) 25 MG tablet Take 12.5 mg by mouth 2 (two) times daily. Taking 1/2    ? oxyCODONE-acetaminophen (PERCOCET) 10-325 MG tablet TAKE ONE TABLET BY MOUTH EVERY FOUR HOURS AS NEEDED FOR PAIN (Patient taking differently: Take 1 tablet by mouth every 4 (four) hours as needed for pain.) 30 tablet 0  ? pantoprazole (PROTONIX) 40 MG tablet Take 1 tablet (40 mg total) by mouth daily before breakfast. 30 tablet 5  ? simvastatin (ZOCOR) 20 MG tablet Take 20 mg by mouth daily.     ? sodium chloride 1 g tablet Take 1 tablet (1 g total) by mouth 3 (three) times daily with meals. 90 tablet 3  ? nitroGLYCERIN (NITROSTAT) 0.4 MG SL tablet Place 0.4 mg under the tongue every 5 (five) minutes as needed for chest pain. (Patient not taking: Reported on 08/13/2021)    ? ?No current facility-administered medications for this visit.  ? ? ?ALLERGIES:  ?No Known Allergies ? ?PHYSICAL EXAM:  ?Performance status (ECOG): 1 - Symptomatic but completely ambulatory ? ?Vitals:  ? 08/13/21 1519  ?BP: 131/68  ?Pulse: 88  ?Resp: 16  ?Temp: 97.7 ?F (36.5 ?C)  ?SpO2: 97%  ? ?Wt Readings from Last 3 Encounters:  ?08/13/21 133 lb 6.1 oz (60.5 kg)  ?08/09/21 145 lb (65.8 kg)  ?07/28/21 145 lb 8 oz (66 kg)  ? ?Physical Exam ?Vitals reviewed.  ?Constitutional:   ?   Appearance: Normal appearance.  ?   Comments: In wheelchair  ?Cardiovascular:  ?   Rate and Rhythm: Normal rate and regular rhythm.  ?   Pulses: Normal pulses.  ?    Heart sounds: Normal heart sounds.  ?Pulmonary:  ?   Effort: Pulmonary effort is normal.  ?   Breath sounds: Normal breath sounds.  ?Musculoskeletal:  ?   Right lower leg: No edema.  ?   Left lower leg: No edema.  ?Neurological:  ?   General: No focal deficit present.  ?   Mental Status: He is alert and oriented to person, place, and time.  ?Psychiatric:     ?   Mood and Affect: Mood normal.     ?   Behavior: Behavior normal.  ?  ? ?LABORATORY DATA:  ?I have reviewed the labs as listed.  ? ?  Latest Ref Rng & Units 08/09/2021  ?  6:10 PM 07/28/2021  ?  8:57 AM 07/21/2021  ?  9:00 AM  ?CBC  ?WBC 4.0 - 10.5 K/uL 13.1   7.2   7.1    ?Hemoglobin 13.0 - 17.0 g/dL 13.5   12.6   12.8    ?Hematocrit 39.0 - 52.0 % 43.0   39.8   39.0    ?Platelets 150 - 400 K/uL 209   158   151    ? ? ?  Latest Ref Rng & Units 08/09/2021  ?  6:10 PM 07/28/2021  ?  8:57 AM 07/14/2021  ?  9:06 AM  ?CMP  ?Glucose 70 - 99 mg/dL 240   298   168    ?BUN 8 - 23 mg/dL '22   15   14    '$ ?Creatinine 0.61 - 1.24 mg/dL 0.75   0.68   0.70    ?Sodium 135 - 145 mmol/L 133   131   130    ?Potassium 3.5 - 5.1 mmol/L 4.5   3.8   4.3    ?Chloride 98 - 111 mmol/L 95   92   93    ?CO2 22 - 32 mmol/L '27   28   26    '$ ?Calcium 8.9 - 10.3 mg/dL 9.0   8.5   8.9    ?Total Protein 6.5 - 8.1 g/dL 6.3   5.9   6.4    ?Total Bilirubin 0.3 - 1.2 mg/dL 0.6   0.7   0.7    ?Alkaline Phos 38 - 126 U/L 75   80   87    ?AST 15 - 41 U/L '18   17   17    '$ ?ALT 0 - 44 U/L '14   19   19    '$ ? ? ?DIAGNOSTIC IMAGING:  ?I have independently reviewed the scans and discussed with the patient. ?CT HEAD WO CONTRAST ? ?Result Date: 08/09/2021 ?CLINICAL DATA:  Fall, recent intracranial hemorrhage, metastatic pancreatic cancer EXAM: CT HEAD WITHOUT CONTRAST CT CERVICAL SPINE WITHOUT CONTRAST TECHNIQUE: Multidetector CT imaging of the head and cervical spine was performed following the standard protocol without intravenous contrast. Multiplanar CT image reconstructions of the cervical spine were also  generated. RADIATION DOSE REDUCTION: This exam was performed according to the departmental dose-optimization program which includes automated exposure control, adjustment of the mA and/or kV according to patient size a

## 2021-08-18 ENCOUNTER — Other Ambulatory Visit (HOSPITAL_COMMUNITY): Payer: Self-pay | Admitting: *Deleted

## 2021-08-18 ENCOUNTER — Telehealth (HOSPITAL_COMMUNITY): Payer: Self-pay | Admitting: *Deleted

## 2021-08-18 DIAGNOSIS — C259 Malignant neoplasm of pancreas, unspecified: Secondary | ICD-10-CM

## 2021-08-18 NOTE — Telephone Encounter (Signed)
Received a call from Santiago Glad, [place] at [facility] stating that she and the family have had discussions regarding the potential for a pleur ex placement so that patient will not have to continue to be taken from the home for frequent paracentesis', due to his current frail condition.  Per Loel Dubonnet at [facility], patient will not have to forfeit Hospice services to have procedure and agrees that Hospice will pay for this to be performed.  Granddaughter Lorriane Shire made aware of appointment 4/11 for 7:30 arrival with NPO after midnight. Also advised that procedure is not scheduled until 9:30 so that they may be prepared for a wait.  Verbalized understanding. ?

## 2021-08-18 NOTE — Progress Notes (Signed)
Per Santiago Glad at [facility], family has requested a pleur ex to be placed so that it can be managed at home.  Per Dr Delton Coombes, will schedule to have this done as outpatient.  Family will be notified of appointment. ?

## 2021-08-19 ENCOUNTER — Encounter (HOSPITAL_COMMUNITY): Payer: Self-pay

## 2021-08-19 ENCOUNTER — Ambulatory Visit (HOSPITAL_COMMUNITY): Payer: Medicare HMO | Admitting: Hematology

## 2021-08-19 ENCOUNTER — Ambulatory Visit (HOSPITAL_COMMUNITY)
Admission: RE | Admit: 2021-08-19 | Discharge: 2021-08-19 | Disposition: A | Discharge status: Home / Self Care | Source: Ambulatory Visit | Attending: Emergency Medicine | Admitting: Emergency Medicine

## 2021-08-19 DIAGNOSIS — C801 Malignant (primary) neoplasm, unspecified: Secondary | ICD-10-CM | POA: Insufficient documentation

## 2021-08-19 DIAGNOSIS — R180 Malignant ascites: Secondary | ICD-10-CM | POA: Diagnosis not present

## 2021-08-19 NOTE — Procedures (Signed)
? ?  US guided LLQ paracentesis ?Hx pancreatic cancer--- recurrent ascites ? ?1.9 L yellow fluid obtained ? ?Tolerated well ? ?EBL: none ?

## 2021-08-19 NOTE — Progress Notes (Signed)
PT tolerated left sided paracentesis procedure well today and 1.9 Liter of clear yellow fluid removed. PT verbalized understanding of discharge instructions and left with family via wheelchair with no acute distress noted.  ?

## 2021-08-24 ENCOUNTER — Other Ambulatory Visit: Payer: Self-pay | Admitting: Radiology

## 2021-08-25 ENCOUNTER — Ambulatory Visit (HOSPITAL_COMMUNITY): Payer: Medicare HMO

## 2021-08-25 ENCOUNTER — Inpatient Hospital Stay (HOSPITAL_COMMUNITY): Admission: RE | Admit: 2021-08-25 | Payer: Medicare HMO | Source: Ambulatory Visit

## 2021-09-14 DEATH — deceased

## 2022-07-22 IMAGING — CT CT BIOPSY AND ASPIRATION BONE MARROW
1 of 2 series · 14 of 29 positions shown, 18 images · non-contrast
Comparison: none

INDICATION: 87-year-old with thrombocytopenia.  Request for bone marrow biopsy.

[Series 2: i-spiral 5.0 br40 · axial · 0.98mm/px · z∈[-524,-433]mm · 14 of 30 slices shown, 18 images]
[im 2/30  mediastinal]
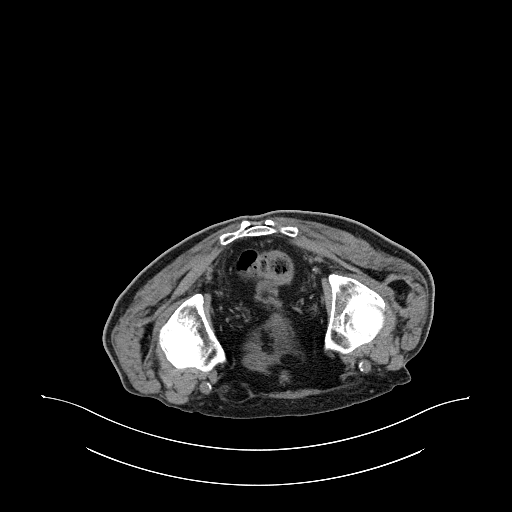
[im 2/30  lung]
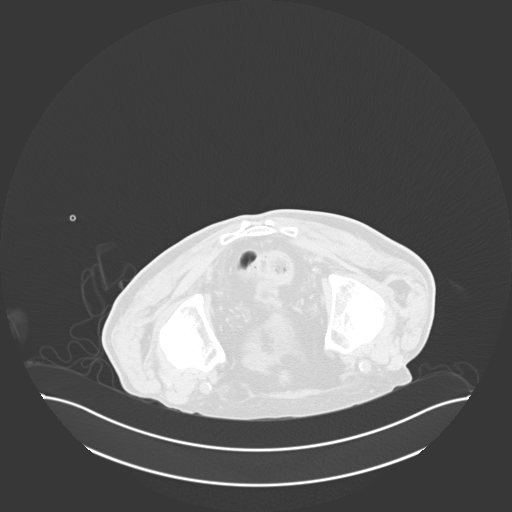
[im 4/30  lung]
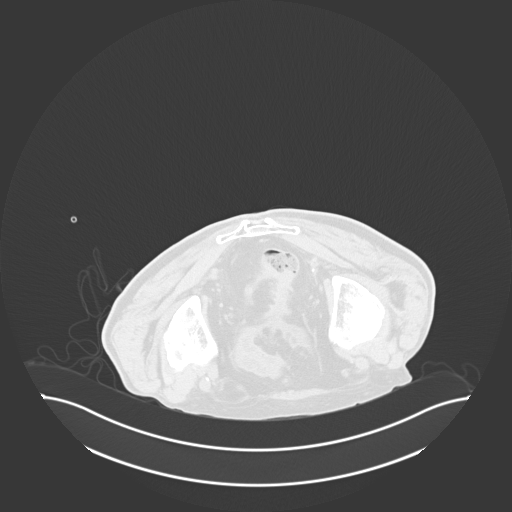
[im 6/30  lung]
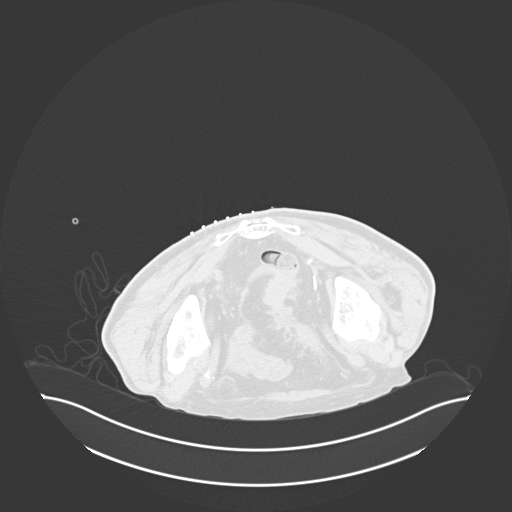
[im 8/30  lung]
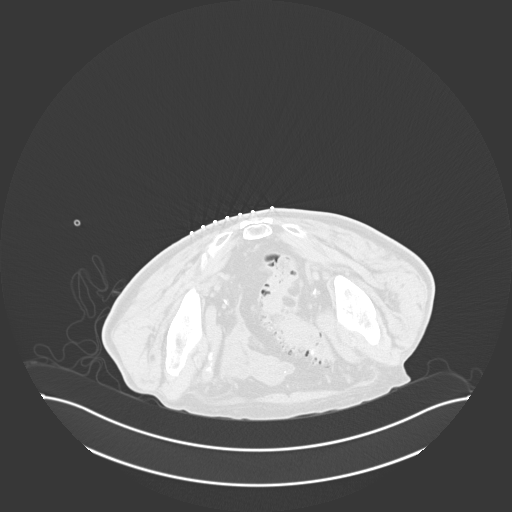
[im 11/30  mediastinal]
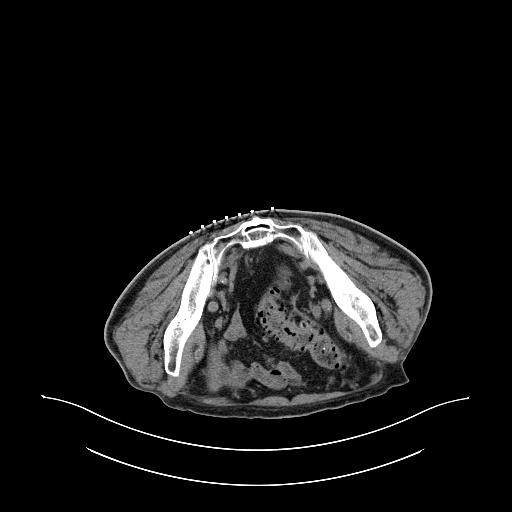
[im 11/30  lung]
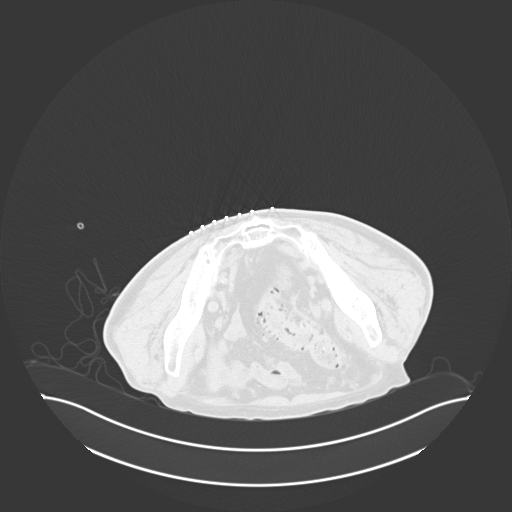
[im 12/30  lung]
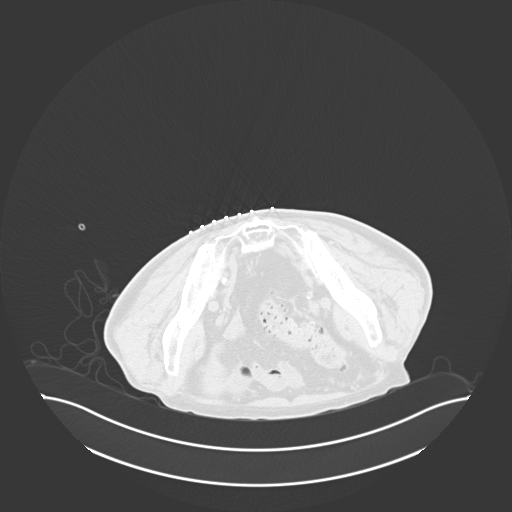
[im 14/30  lung]
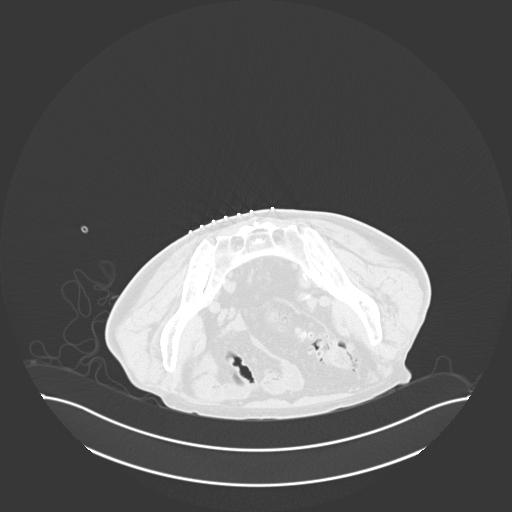
[im 15/30  lung]
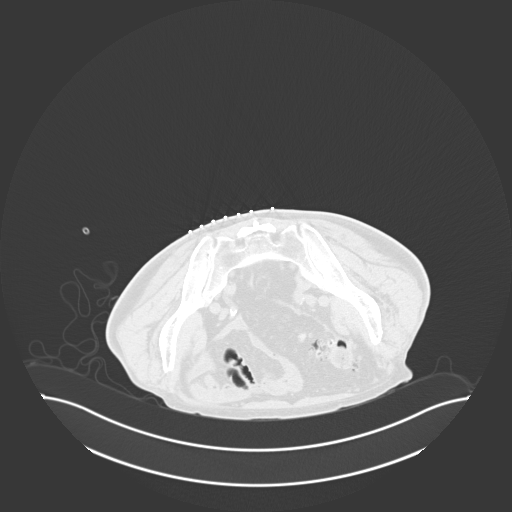
[im 18/30  mediastinal]
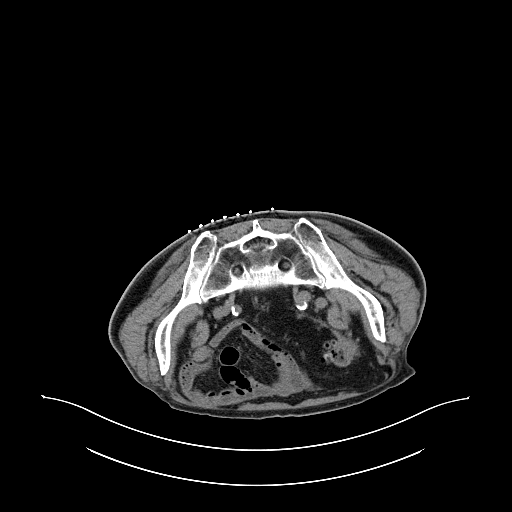
[im 18/30  lung]
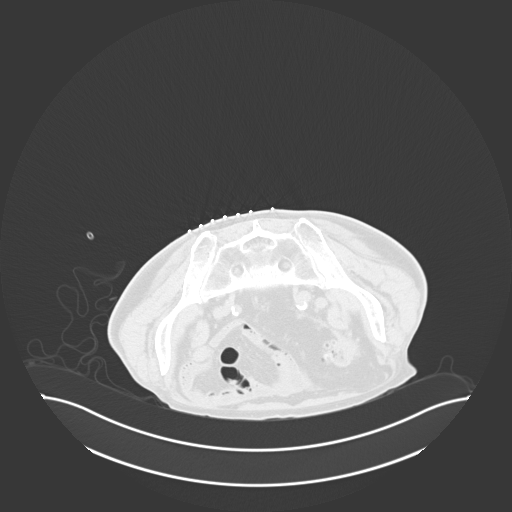
[im 19/30  lung]
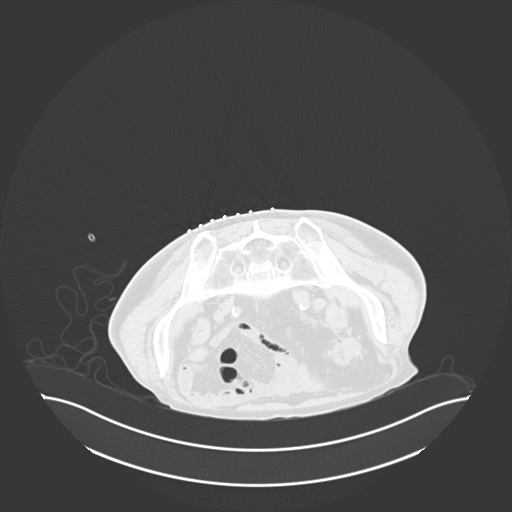
[im 22/30  lung]
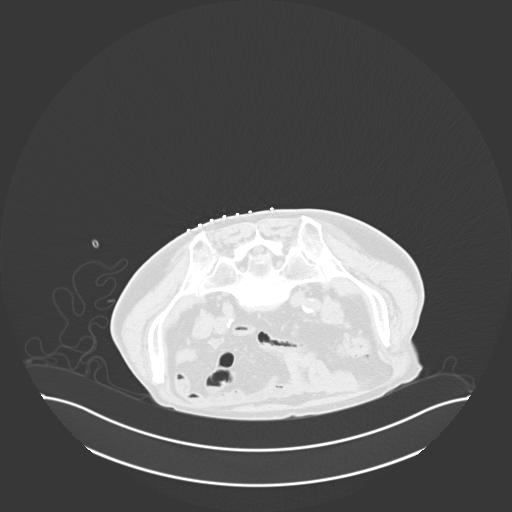
[im 24/30  lung]
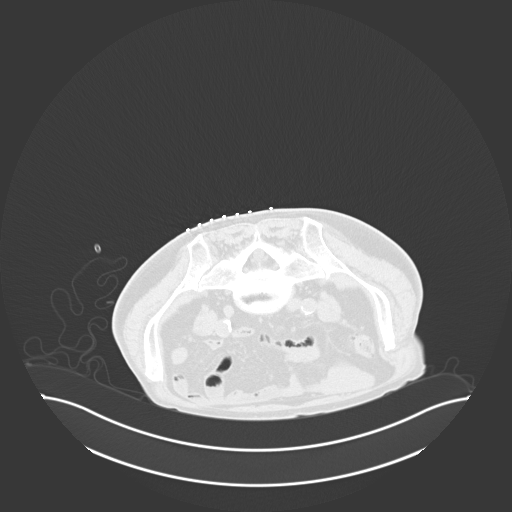
[im 26/30  mediastinal]
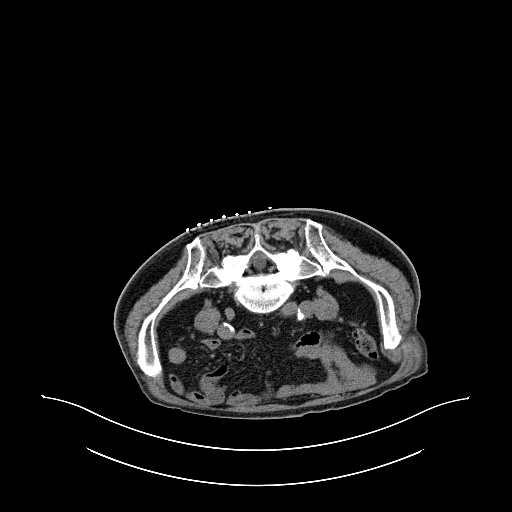
[im 26/30  lung]
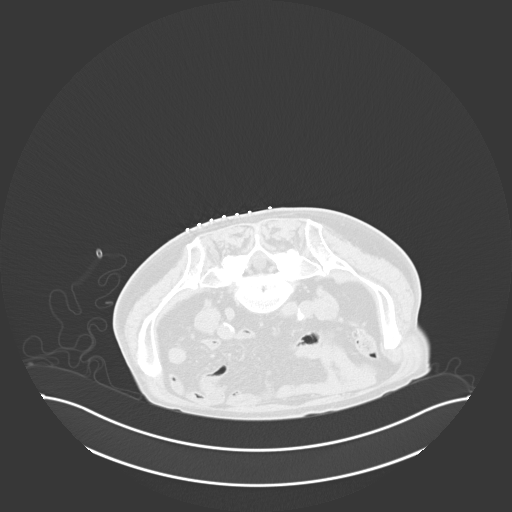
[im 28/30  lung]
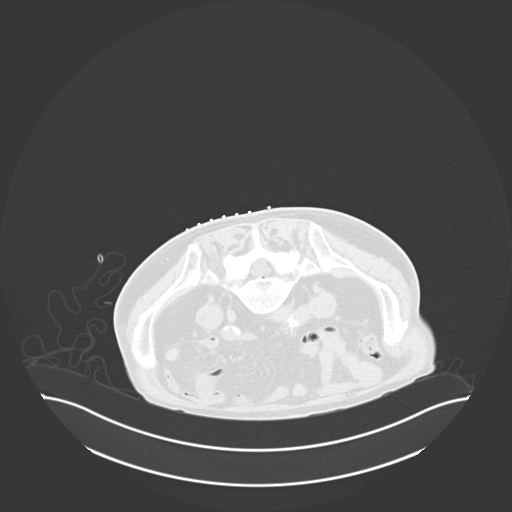

[14 of 29 positions shown; findings below may reference images not displayed]

EXAM:
CT GUIDED BONE MARROW ASPIRATES AND BIOPSY

MEDICATIONS:
None.

ANESTHESIA/SEDATION:
Moderate (conscious) sedation was employed during this procedure. A
total of Versed 1.0mg and fentanyl 50 mcg was administered
intravenously at the order of the provider performing the procedure.

Total intra-service moderate sedation time: 10 minutes.

Patient's level of consciousness and vital signs were monitored
continuously by radiology nurse throughout the procedure under the
supervision of the provider performing the procedure.

COMPLICATIONS:
None immediate.

PROCEDURE:
The procedure was explained to the patient. The risks and benefits
of the procedure were discussed and the patient's questions were
addressed. Informed consent was obtained from the patient. The
patient was placed prone on CT table. Images of the pelvis were
obtained. The left side of back was prepped and draped in sterile
fashion. The skin and left posterior ilium were anesthetized with 1%
lidocaine. 11 gauge bone needle was directed into the left ilium
with CT guidance. Two aspirates and one core biopsy were obtained.
Bandage placed over the puncture site.
FINDINGS: Images of the pelvis demonstrate extensive diverticulosis in the
sigmoid colon. Trace fluid in the pelvis. Pericolonic densities
around the sigmoid colon are suggestive for age-indeterminate
inflammatory changes. There is probably a right inguinal hernia but
this area is incompletely imaged.

Two foci of gas associated with the right SI joint. Biopsy needle
was directed to the posterior left ilium.
IMPRESSION: 1. CT-guided bone marrow aspiration and biopsy.
2. Diverticulosis with trace fluid in the pelvis. Pericolonic
densities could be related to inflammation of unknown age. Difficult
to exclude occult diverticulitis. If there is concern for acute
diverticulitis, recommend repeat CT of the abdomen and pelvis with
intravascular contrast.

These results will be called to the ordering clinician or
representative by the Radiologist Assistant, and communication
documented in the PACS or [REDACTED].

## 2022-09-14 IMAGING — CT CT CERVICAL SPINE W/O CM
3 of 4 series · 12 of 33 positions shown, 14 images · non-contrast
Comparison: 06/28/2021

CLINICAL DATA: Fall, recent intracranial hemorrhage, metastatic
pancreatic cancer



[Series 5: sagittal bone · sagittal · 0.30mm/px · 5 of 61 slices shown, 6 images]
[im 21/61  bone]
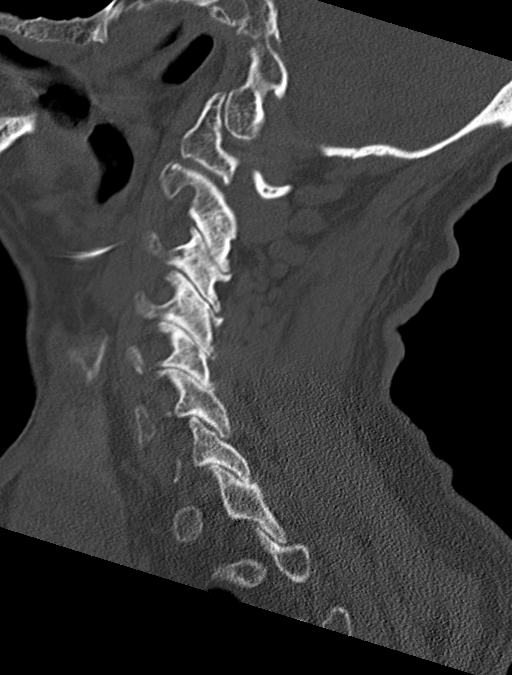
[im 26/61  bone]
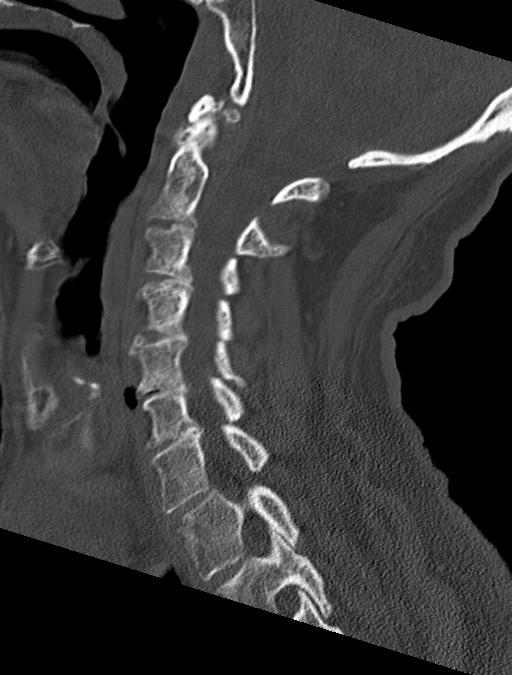
[im 31/61  soft-tissue]
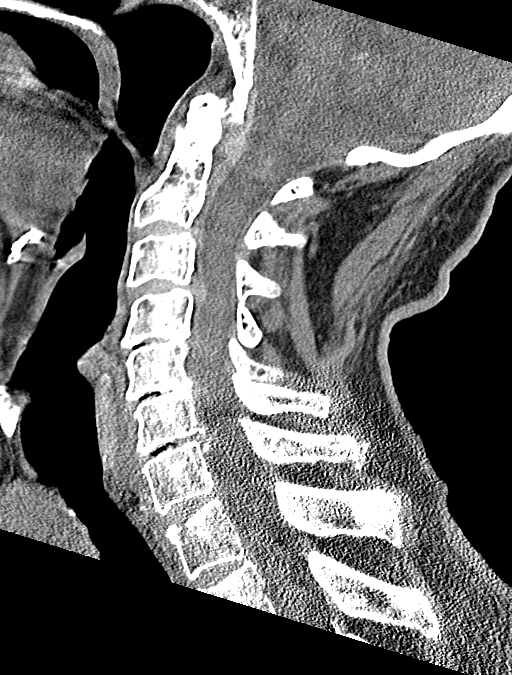
[im 31/61  bone]
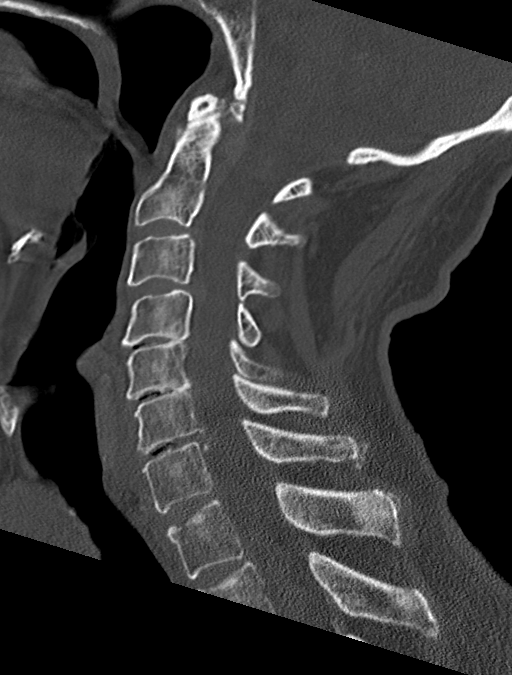
[im 36/61  bone]
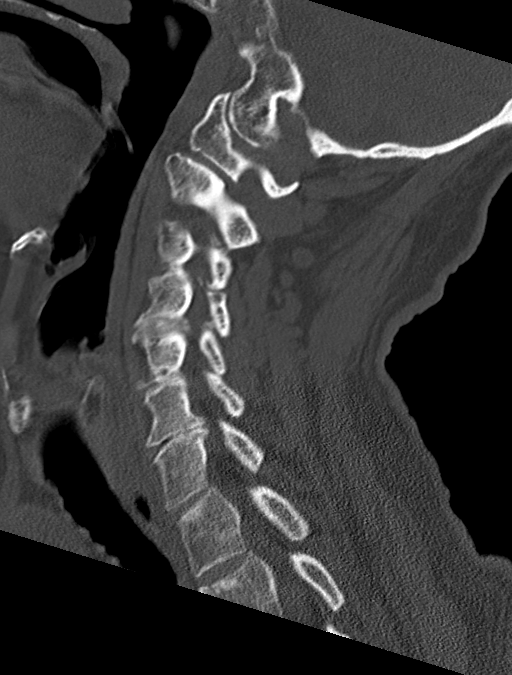
[im 41/61  bone]
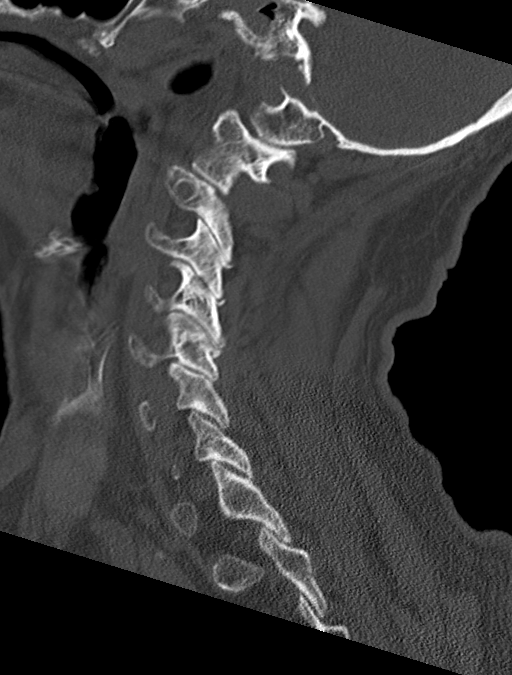

[Series 6: coronal bone · coronal · 0.23mm/px · 3 of 78 slices shown]
[im 16/78  bone]
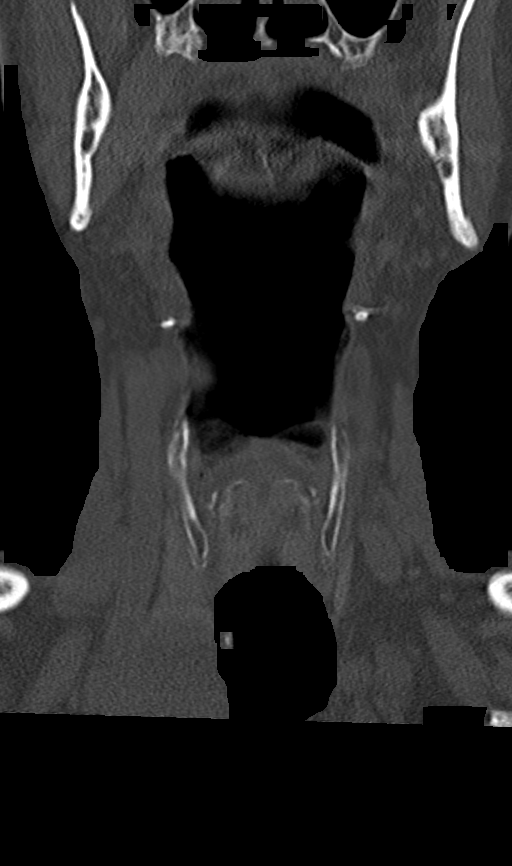
[im 31/78  bone]
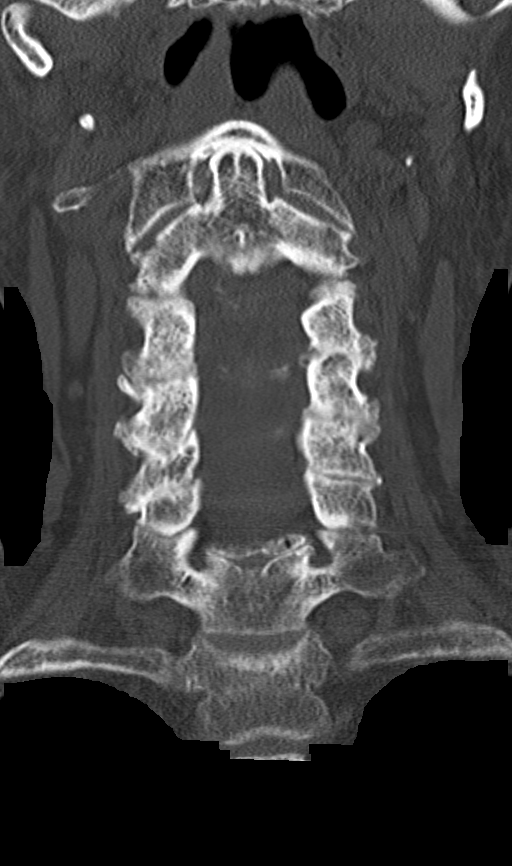
[im 47/78  bone]
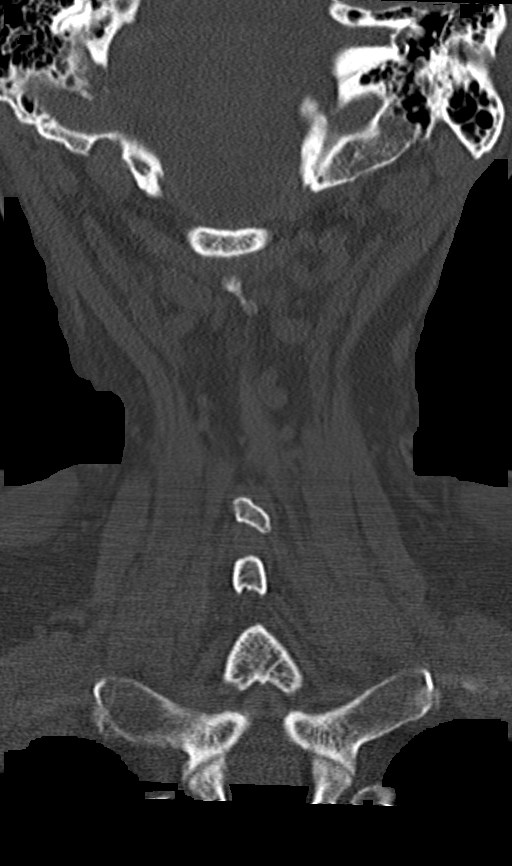

[Series 7: orthogonal axials · axial · 0.21mm/px · z∈[-139,-28]mm · 4 of 83 slices shown, 5 images]
[im 14/83  soft-tissue]
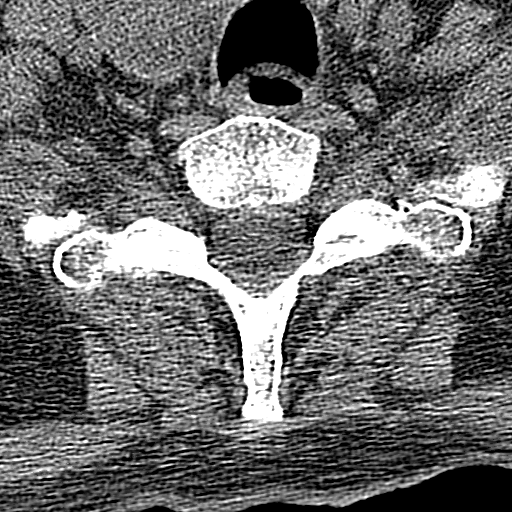
[im 14/83  bone]
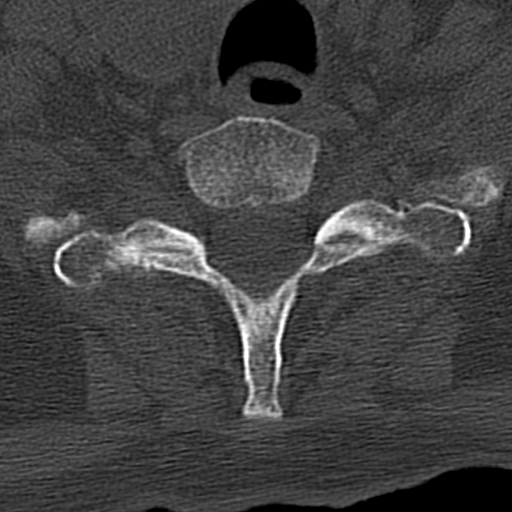
[im 28/83  bone]
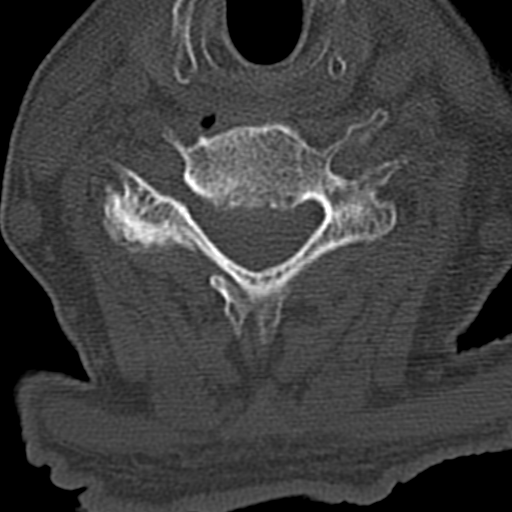
[im 55/83  bone]
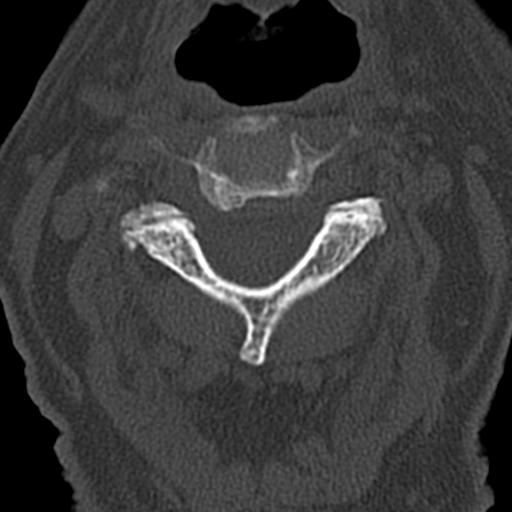
[im 69/83  bone]
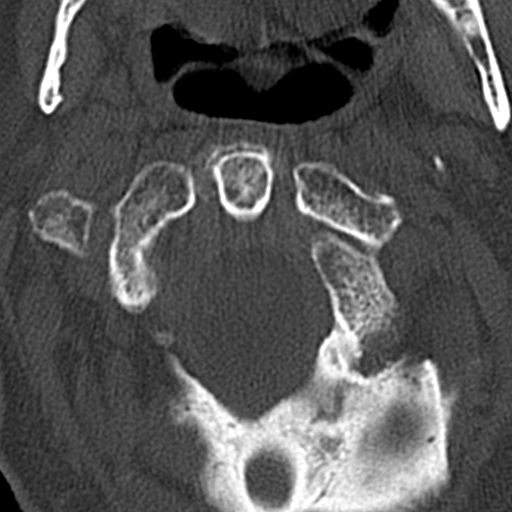

[12 of 33 positions shown; findings below may reference images not displayed]

FINDINGS: CT HEAD FINDINGS

Brain: No evidence of acute infarction, hemorrhage, hydrocephalus,
extra-axial collection or mass lesion/mass effect. Mild global
cerebral volume loss. Periventricular white matter hypodensity.
Previously seen small bilateral subdural hematomas have resolved.

Vascular: No hyperdense vessel or unexpected calcification.

Skull: Normal. Negative for fracture or focal lesion.

Sinuses/Orbits: No acute finding.

Other: None.

CT CERVICAL SPINE FINDINGS

Alignment: Normal.

Skull base and vertebrae: No acute fracture. No primary bone lesion
or focal pathologic process.

Soft tissues and spinal canal: No prevertebral fluid or swelling. No
visible canal hematoma.

Disc levels: Moderate multilevel disc space height loss and
osteophytosis, worst from C4 through C6.

Upper chest: Negative.

Other: Large, partially imaged exophytic nodule of the inferior pole
of the right lobe of the thyroid, not previously PET avid. In the
setting of significant comorbidities or limited life expectancy, no
follow-up recommended (ref: [HOSPITAL]. [DATE]):
IMPRESSION: 1. No acute intracranial pathology. Previously seen small bilateral
subdural hematomas have resolved.
2. No fracture or static subluxation of the cervical spine. Moderate
multilevel cervical disc degenerative disease.

## 2022-09-18 IMAGING — US US PARACENTESIS
1 series · 2 of 2 positions shown · non-contrast
Comparison: none

INDICATION: Recurrent ascites

[Series 1: us paracentesis · 2 of 2 slices shown]
[im 1/2]
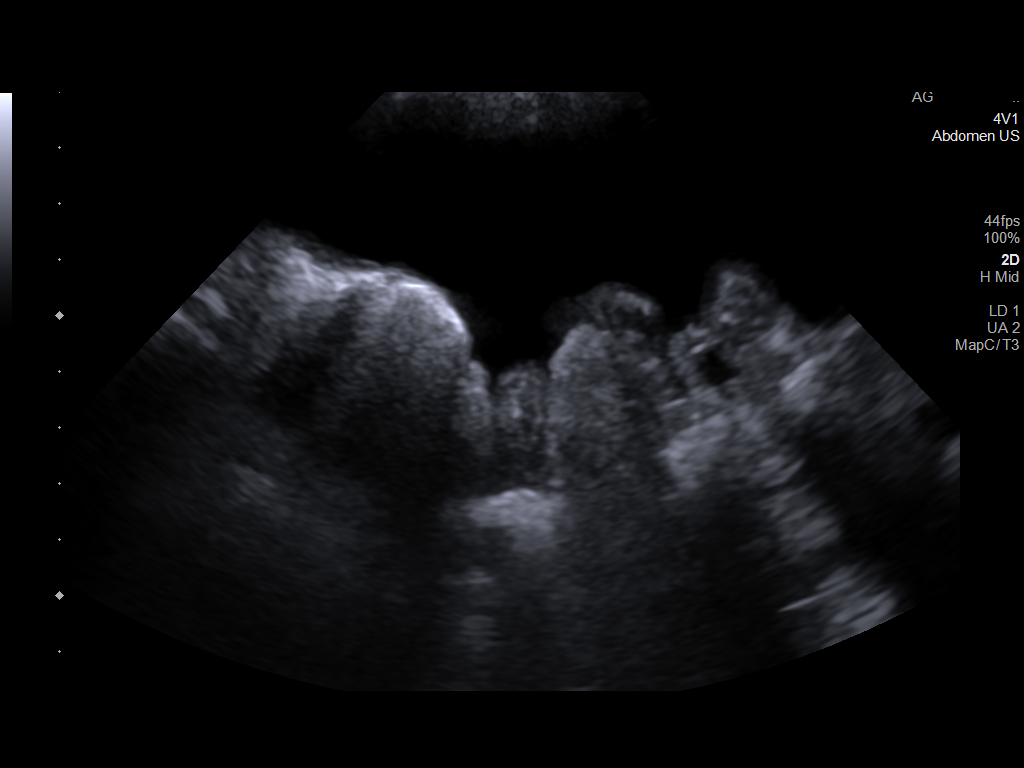
[im 2/2]
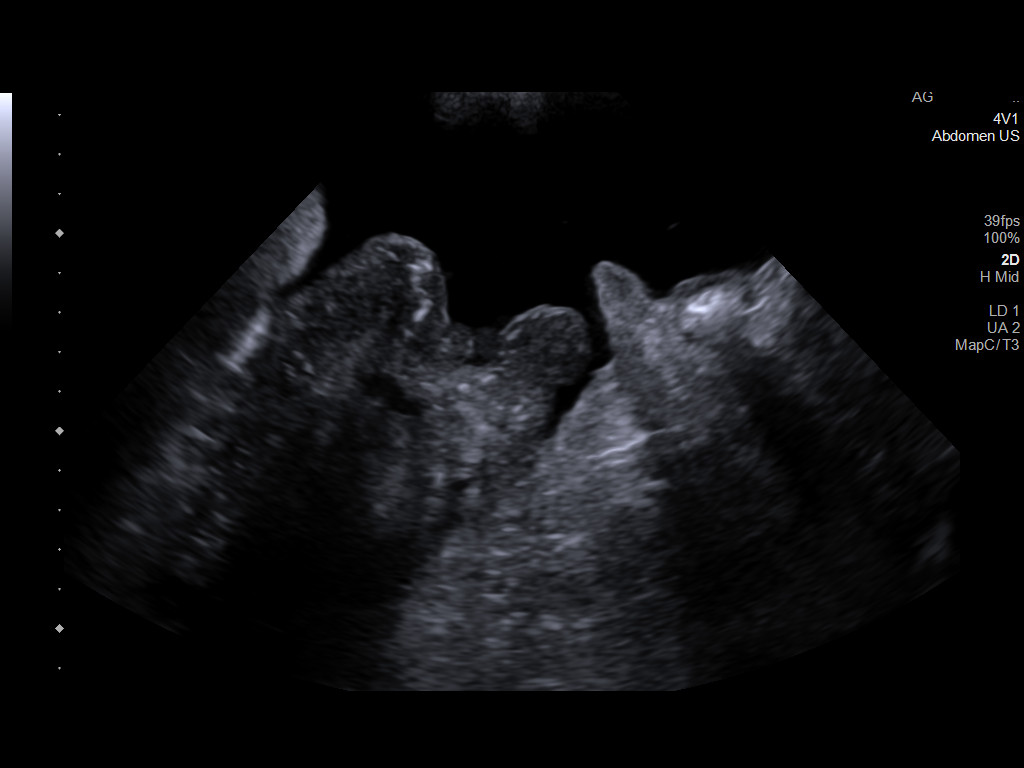

[2 of 2 positions shown; findings below may reference images not displayed]

Pancreatic cancer

EXAM:
ULTRASOUND GUIDED LLQ PARACENTESIS

MEDICATIONS:
10 cc 1% lidocaine.

COMPLICATIONS:
None immediate.

PROCEDURE:
Informed written consent was obtained from the patient after a
discussion of the risks, benefits and alternatives to treatment. A
timeout was performed prior to the initiation of the procedure.

Initial ultrasound scanning demonstrates a large amount of ascites
within the left lower abdominal quadrant. The left lower abdomen was
prepped and draped in the usual sterile fashion. 1% lidocaine was
used for local anesthesia.

Following this, a Yueh catheter was introduced. An ultrasound image
was saved for documentation purposes. The paracentesis was
performed. The catheter was removed and a dressing was applied. The
patient tolerated the procedure well without immediate post
procedural complication.
Patient received post-procedure intravenous albumin; see nursing
notes for details.
FINDINGS: A total of approximately 3.3 liters of yellow fluid was removed.
IMPRESSION: Successful ultrasound-guided paracentesis yielding 3.3 liters of
peritoneal fluid.

Read by Ngan Ezell

## 2023-11-08 ENCOUNTER — Other Ambulatory Visit: Payer: Self-pay | Admitting: *Deleted

## 2023-11-08 NOTE — Progress Notes (Signed)
 Order management.
